# Patient Record
Sex: Male | Born: 1950
Health system: Southern US, Community
[De-identification: ages and names within clinical notes are randomized; demographics above are authoritative.]

## PROBLEM LIST (undated history)

## (undated) DIAGNOSIS — Z952 Presence of prosthetic heart valve: Secondary | ICD-10-CM

## (undated) DIAGNOSIS — I10 Essential (primary) hypertension: Secondary | ICD-10-CM

## (undated) DIAGNOSIS — M1990 Unspecified osteoarthritis, unspecified site: Secondary | ICD-10-CM

## (undated) DIAGNOSIS — T8859XA Other complications of anesthesia, initial encounter: Secondary | ICD-10-CM

## (undated) DIAGNOSIS — G602 Neuropathy in association with hereditary ataxia: Secondary | ICD-10-CM

## (undated) DIAGNOSIS — K219 Gastro-esophageal reflux disease without esophagitis: Secondary | ICD-10-CM

## (undated) DIAGNOSIS — Z950 Presence of cardiac pacemaker: Secondary | ICD-10-CM

## (undated) DIAGNOSIS — R7303 Prediabetes: Secondary | ICD-10-CM

## (undated) HISTORY — PX: INSERT / REPLACE / REMOVE PACEMAKER: SUR710

## (undated) HISTORY — PX: HEMORRHOID SURGERY: SHX153

## (undated) HISTORY — PX: OTHER SURGICAL HISTORY: SHX169

## (undated) HISTORY — PX: EYE SURGERY: SHX253

## (undated) HISTORY — PX: AORTIC VALVE REPLACEMENT: SHX41

---

## 2012-07-02 ENCOUNTER — Other Ambulatory Visit: Payer: Self-pay | Admitting: Orthopedic Surgery

## 2012-07-02 MED ORDER — DEXAMETHASONE SODIUM PHOSPHATE 10 MG/ML IJ SOLN: 10.0000 mg | Freq: Once | INTRAMUSCULAR | Status: DC

## 2012-07-02 MED ORDER — BUPIVACAINE LIPOSOME 1.3 % IJ SUSP: 20.0000 mL | Freq: Once | INTRAMUSCULAR | Status: DC

## 2012-07-06 NOTE — Progress Notes (Signed)
H&P performed 07/06/12 Dictation # [id number]

## 2012-07-07 NOTE — H&P (Signed)
Nathaniel Richard, Nathaniel Richard NO.:  [id number]  MEDICAL RECORD NO.:  [id number]  LOCATION:                               FACILITY:  [facility]  PHYSICIAN:  Ollen Gross, M.D.    DATE OF BIRTH:  [birth year]  DATE OF ADMISSION:  07/29/2012 DATE OF DISCHARGE:                             HISTORY & PHYSICAL   Date of surgery July 29, 2012.  ADMITTING DIAGNOSIS:  End-stage osteoarthritis, right hip.  PROPOSED PROCEDURE:  Total hip arthroplasty, right hip.  HISTORY OF PRESENT ILLNESS:  This is a 61 year old gentleman with a long history of osteoarthritis of his right hip that has failed conservative management.  After discussion of treatments benefits risks and options, the patient is now scheduled for total hip arthroplasty of the right hip.  The patient plans on going home after surgery.  His medical doctor is Dr. Dalbert Garnet.  His specialty physician is Dr. Harle Stanford, his cardiologist.  The surgery, risks, benefits, and aftercare were discussed in detail with the patient.  Questions invited and answered.  PAST MEDICAL HISTORY/DRUG:  Allergies none.  CURRENT MEDICATION:  Lisinopril 10 mg and Nasonex nasal spray p.r.n.  SERIOUS MEDICAL ILLNESSES:  Hypertension and history of heart block with pacemaker.  PREVIOUS SURGERIES:  Pacemaker insertion and hemorrhoidectomy.  FAMILY HISTORY:  Positive for congestive heart failure, CVA, diabetes, and hypertension.  SOCIAL HISTORY:  The patient is married.  He is a Psychologist, sport and exercise.  He does not smoke and does not drink.  He lives with his wife, who is a Engineer, civil (consulting) and again plans on going home after surgery.  REVIEW OF SYSTEMS:  CENTRAL NERVOUS SYSTEM:  Negative for headache, blurred vision, or dizziness.  PULMONARY:  Negative for shortness breath, PND, and orthopnea.  CARDIOVASCULAR:  Positive for history of heart block with pacemaker.  Negative for chest pain or palpitation. Positive for hypertension. GI:  Negative for  ulcers or hepatitis.  GU: Negative for urinary tract difficulty.  MUSCULOSKELETAL:  Positive as in HPI.  PHYSICAL EXAMINATION:  GENERAL:  This is a well-developed, well- nourished gentleman, in no acute distress. VITAL SIGNS:  Pulse is 64 and regular, and respirations are 12. HEENT:  Head normocephalic.  Nose patent.  Ears patent.  Pupils equal, round, reactive to light.  Throat without injection. NECK:  Supple without adenopathy.  Carotids 2+ without bruit. CHEST:  Clear to auscultation.  No rales or rhonchi.  Respirations 12. HEART:  Regular rate and rhythm at 72 beats per minute with 2/6 systolic ejection murmur that he has had for a long period of time. ABDOMEN:  Soft with active bowel sounds.  No masses or organomegaly. NEUROLOGIC:  The patient alert and oriented to time, place, and person. Cranial nerves II through XII grossly intact. EXTREMITIES:  The right hip with markedly decreased internal-external rotation.  He has full extension further flexion to 110 degrees, external rotation of 35 degrees and internal rotation to neutral. Dorsalis pedis, posterior tibialis pulses are 2+.  ASSESSMENT:  End-stage osteoarthritis of the right hip.  PLAN:  Total hip arthroplasty of the right hip.     Jaquelyn Bitter. Sharday Michl, P.A.   ______________________________ Homero Fellers  Aluisio, M.D.    SJC/MEDQ  D:  07/06/2012  T:  07/07/2012  Job:  [id number]

## 2012-07-17 ENCOUNTER — Encounter (HOSPITAL_COMMUNITY): Payer: Self-pay

## 2012-07-21 ENCOUNTER — Other Ambulatory Visit: Payer: Self-pay | Admitting: Surgical

## 2012-07-21 MED ORDER — DEXAMETHASONE SODIUM PHOSPHATE 10 MG/ML IJ SOLN: 10.0000 mg | Freq: Once | INTRAMUSCULAR | Status: DC

## 2012-07-21 MED ORDER — BUPIVACAINE 0.25 % ON-Q PUMP SINGLE CATH 300ML: 300.0000 mL | INJECTION | Status: DC

## 2012-07-22 NOTE — Patient Instructions (Signed)
20 [name]  07/22/2012   Your procedure is scheduled on:  07/29/12 0715am-0900am  Report to [facility] at 0515 AM.  Call this number if you have problems the morning of surgery: [phone]   Remember:   Do not eat food:After Midnight.  May have clear liquids:until Midnight .  Marland Kitchen  Take these medicines the morning of surgery with A SIP OF WATER:    Do not wear jewelry, make-up or nail polish.  Do not wear lotions, powders, or perfumes.   . Men may shave face and neck.  Do not bring valuables to the hospital.  Contacts, dentures or bridgework may not be worn into surgery.  Leave suitcase in the car. After surgery it may be brought to your room.  For patients admitted to the hospital, checkout time is 11:00 AM the day of discharge.       Special Instructions: CHG Shower Use Special Wash: 1/2 bottle night before surgery and 1/2 bottle morning of surgery. shower chin to toes with CHG. Wash face and private parts with regular soap.    Please read over the following fact sheets that you were given: MRSA Information, Blood Transfusion Fact sheet, Incentive Spirometry fact Sheet, coughing and deep breathing exercises, leg exercises

## 2012-07-23 ENCOUNTER — Encounter (HOSPITAL_COMMUNITY): Payer: Self-pay

## 2012-07-23 ENCOUNTER — Encounter (HOSPITAL_COMMUNITY)
Admission: RE | Admit: 2012-07-23 | Discharge: 2012-07-23 | Disposition: A | Discharge status: Home / Self Care | Payer: No Typology Code available for payment source | Source: Ambulatory Visit | Attending: Orthopedic Surgery | Admitting: Orthopedic Surgery

## 2012-07-23 ENCOUNTER — Ambulatory Visit (HOSPITAL_COMMUNITY)
Admission: RE | Admit: 2012-07-23 | Discharge: 2012-07-23 | Disposition: A | Discharge status: Home / Self Care | Payer: No Typology Code available for payment source | Source: Ambulatory Visit | Attending: Surgical | Admitting: Surgical

## 2012-07-23 DIAGNOSIS — Z950 Presence of cardiac pacemaker: Secondary | ICD-10-CM | POA: Insufficient documentation

## 2012-07-23 DIAGNOSIS — Z01812 Encounter for preprocedural laboratory examination: Secondary | ICD-10-CM | POA: Insufficient documentation

## 2012-07-23 DIAGNOSIS — Z01818 Encounter for other preprocedural examination: Secondary | ICD-10-CM | POA: Insufficient documentation

## 2012-07-23 HISTORY — DX: Presence of cardiac pacemaker: Z95.0

## 2012-07-23 HISTORY — DX: Essential (primary) hypertension: I10

## 2012-07-23 HISTORY — DX: Unspecified osteoarthritis, unspecified site: M19.90

## 2012-07-23 LAB — URINALYSIS, ROUTINE W REFLEX MICROSCOPIC
Bilirubin Urine: NEGATIVE
Glucose, UA: NEGATIVE mg/dL
Hgb urine dipstick: NEGATIVE
Ketones, ur: NEGATIVE mg/dL
Leukocytes, UA: NEGATIVE
Nitrite: NEGATIVE
Protein, ur: NEGATIVE mg/dL
Specific Gravity, Urine: 1.01 (ref 1.005–1.030)
Urobilinogen, UA: 0.2 mg/dL (ref 0.0–1.0)
pH: 7 (ref 5.0–8.0)

## 2012-07-23 LAB — COMPREHENSIVE METABOLIC PANEL
ALT: 28 U/L (ref 0–53)
AST: 32 U/L (ref 0–37)
Albumin: 3.8 g/dL (ref 3.5–5.2)
Alkaline Phosphatase: 57 U/L (ref 39–117)
BUN: 17 mg/dL (ref 6–23)
CO2: 27 mEq/L (ref 19–32)
Calcium: 9.5 mg/dL (ref 8.4–10.5)
Chloride: 102 mEq/L (ref 96–112)
Creatinine, Ser: 0.96 mg/dL (ref 0.50–1.35)
GFR calc Af Amer: >90 mL/min (ref >90)
GFR calc non Af Amer: 87 mL/min — ABNORMAL LOW (ref >90)
Glucose, Bld: 95 mg/dL (ref 70–99)
Potassium: 4.1 mEq/L (ref 3.5–5.1)
Sodium: 137 mEq/L (ref 135–145)
Total Bilirubin: 0.6 mg/dL (ref 0.3–1.2)
Total Protein: 6.9 g/dL (ref 6.0–8.3)

## 2012-07-23 LAB — DIFFERENTIAL
Basophils Relative: 1 % (ref 0–1)
Eosinophils Absolute: 0.4 10*3/uL (ref 0.0–0.7)
Monocytes Relative: 12 % (ref 3–12)
Neutrophils Relative %: 50 % (ref 43–77)

## 2012-07-23 LAB — PROTIME-INR
INR: 0.94 (ref 0.00–1.49)
Prothrombin Time: 12.8 seconds (ref 11.6–15.2)

## 2012-07-23 LAB — CBC
Hemoglobin: 15.4 g/dL (ref 13.0–17.0)
MCH: 30.9 pg (ref 26.0–34.0)
MCHC: 34.8 g/dL (ref 30.0–36.0)

## 2012-07-23 LAB — SURGICAL PCR SCREEN: Staphylococcus aureus: POSITIVE — AB

## 2012-07-23 LAB — APTT: aPTT: 32 seconds (ref 24–37)

## 2012-07-23 NOTE — Progress Notes (Signed)
Last ICD interrogation 03/10/12 on chart and 11/12 on chart  04/22/11 EKG on chart  ECHO 4/11 on chart

## 2012-07-23 NOTE — Progress Notes (Signed)
07/23/12 0856  OBSTRUCTIVE SLEEP APNEA  Have you ever been diagnosed with sleep apnea through a sleep study? No  Do you snore loudly (loud enough to be heard through closed doors)?  1  Do you often feel tired, fatigued, or sleepy during the daytime? 0  Has anyone observed you stop breathing during your sleep? 0  Do you have, or are you being treated for high blood pressure? 1  BMI more than 35 kg/m2? 0  Age over 62 years old? 1  Neck circumference greater than 40 cm/18 inches? 0  Gender: 1  Obstructive Sleep Apnea Score 4   Score 4 or greater  Updated health history

## 2012-07-27 ENCOUNTER — Other Ambulatory Visit: Payer: Self-pay | Admitting: Orthopedic Surgery

## 2012-07-28 NOTE — Anesthesia Preprocedure Evaluation (Addendum)
Anesthesia Evaluation  Patient identified by MRN, date of birth, ID band Patient awake    Reviewed: Allergy & Precautions, H&P , NPO status , Patient's Chart, lab work & pertinent test results  Airway Mallampati: II TM Distance: >3 FB Neck ROM: Full    Dental  (+) Dental Advisory Given Upper bridge.:   Pulmonary neg pulmonary ROS,  breath sounds clear to auscultation  Pulmonary exam normal       Cardiovascular hypertension, Pt. on medications + dysrhythmias + pacemaker + Valvular Problems/Murmurs Rhythm:Regular Rate:Normal + Systolic murmurs    Neuro/Psych negative neurological ROS  negative psych ROS   GI/Hepatic negative GI ROS, Neg liver ROS,   Endo/Other  negative endocrine ROS  Renal/GU negative Renal ROS  negative genitourinary   Musculoskeletal negative musculoskeletal ROS (+)   Abdominal   Peds negative pediatric ROS (+)  Hematology negative hematology ROS (+)   Anesthesia Other Findings   Reproductive/Obstetrics negative OB ROS                          Anesthesia Physical Anesthesia Plan  ASA: III  Anesthesia Plan: General   Post-op Pain Management:    Induction: Intravenous  Airway Management Planned: Oral ETT  Additional Equipment:   Intra-op Plan:   Post-operative Plan: Extubation in OR  Informed Consent: I have reviewed the patients History and Physical, chart, labs and discussed the procedure including the risks, benefits and alternatives for the proposed anesthesia with the patient or authorized representative who has indicated his/her understanding and acceptance.   Dental advisory given  Plan Discussed with: CRNA  Anesthesia Plan Comments: (Pacemaker for heart block. Pacemaker prescription reviewed. Discussed r/b/a/ general versus spinal. Patient prefers general.)       Anesthesia Quick Evaluation

## 2012-07-29 ENCOUNTER — Encounter (HOSPITAL_COMMUNITY): Payer: Self-pay | Admitting: Anesthesiology

## 2012-07-29 ENCOUNTER — Encounter (HOSPITAL_COMMUNITY)
Admission: RE | Disposition: A | Discharge status: Home Health | Payer: Self-pay | Source: Ambulatory Visit | Attending: Orthopedic Surgery

## 2012-07-29 ENCOUNTER — Ambulatory Visit (HOSPITAL_COMMUNITY): Payer: No Typology Code available for payment source

## 2012-07-29 ENCOUNTER — Inpatient Hospital Stay: Admit: 2012-07-29 | Payer: Self-pay | Admitting: Orthopedic Surgery

## 2012-07-29 ENCOUNTER — Ambulatory Visit (HOSPITAL_COMMUNITY): Payer: No Typology Code available for payment source | Admitting: Anesthesiology

## 2012-07-29 ENCOUNTER — Encounter (HOSPITAL_COMMUNITY): Payer: Self-pay | Admitting: *Deleted

## 2012-07-29 ENCOUNTER — Inpatient Hospital Stay (HOSPITAL_COMMUNITY)
Admission: RE | Admit: 2012-07-29 | Discharge: 2012-07-31 | DRG: 470 | Disposition: A | Discharge status: Home Health | Payer: No Typology Code available for payment source | Source: Ambulatory Visit | Attending: Orthopedic Surgery | Admitting: Orthopedic Surgery

## 2012-07-29 DIAGNOSIS — Z950 Presence of cardiac pacemaker: Secondary | ICD-10-CM

## 2012-07-29 DIAGNOSIS — I499 Cardiac arrhythmia, unspecified: Secondary | ICD-10-CM | POA: Diagnosis present

## 2012-07-29 DIAGNOSIS — M1610 Unilateral primary osteoarthritis, unspecified hip: Principal | ICD-10-CM | POA: Diagnosis present

## 2012-07-29 DIAGNOSIS — Z96649 Presence of unspecified artificial hip joint: Secondary | ICD-10-CM

## 2012-07-29 DIAGNOSIS — Z79899 Other long term (current) drug therapy: Secondary | ICD-10-CM

## 2012-07-29 DIAGNOSIS — M169 Osteoarthritis of hip, unspecified: Secondary | ICD-10-CM | POA: Diagnosis present

## 2012-07-29 DIAGNOSIS — I10 Essential (primary) hypertension: Secondary | ICD-10-CM | POA: Diagnosis present

## 2012-07-29 HISTORY — PX: TOTAL HIP ARTHROPLASTY: SHX124

## 2012-07-29 LAB — TYPE AND SCREEN
ABO/RH(D): A POS
Antibody Screen: NEGATIVE

## 2012-07-29 LAB — ABO/RH: ABO/RH(D): A POS

## 2012-07-29 SURGERY — ARTHROPLASTY, HIP, TOTAL,POSTERIOR APPROACH
Anesthesia: Choice | Site: Hip | Laterality: Right

## 2012-07-29 SURGERY — ARTHROPLASTY, HIP, TOTAL,POSTERIOR APPROACH
Anesthesia: General | Site: Hip | Laterality: Right | Wound class: Clean

## 2012-07-29 MED ORDER — METOCLOPRAMIDE HCL 5 MG/ML IJ SOLN: 5.0000 mg | Freq: Three times a day (TID) | INTRAMUSCULAR | Status: DC | PRN

## 2012-07-29 MED ORDER — MORPHINE SULFATE 2 MG/ML IJ SOLN: 1.0000 mg | INTRAMUSCULAR | Status: DC | PRN

## 2012-07-29 MED ORDER — PROPOFOL 10 MG/ML IV EMUL
INTRAVENOUS | Status: DC | PRN
  Administered 2012-07-29: 200 mg via INTRAVENOUS

## 2012-07-29 MED ORDER — LACTATED RINGERS IV SOLN
INTRAVENOUS | Status: DC | PRN
  Administered 2012-07-29 (×2): via INTRAVENOUS

## 2012-07-29 MED ORDER — METOCLOPRAMIDE HCL 10 MG PO TABS: 5.0000 mg | ORAL_TABLET | Freq: Three times a day (TID) | ORAL | Status: DC | PRN

## 2012-07-29 MED ORDER — CEFAZOLIN SODIUM-DEXTROSE 2-3 GM-% IV SOLR
INTRAVENOUS | Status: AC
  Filled 2012-07-29: qty 50

## 2012-07-29 MED ORDER — HYDROMORPHONE HCL PF 1 MG/ML IJ SOLN
INTRAMUSCULAR | Status: AC
  Filled 2012-07-29: qty 1

## 2012-07-29 MED ORDER — TRAMADOL HCL 50 MG PO TABS: 50.0000 mg | ORAL_TABLET | Freq: Four times a day (QID) | ORAL | Status: DC | PRN

## 2012-07-29 MED ORDER — ONDANSETRON HCL 4 MG PO TABS: 4.0000 mg | ORAL_TABLET | Freq: Four times a day (QID) | ORAL | Status: DC | PRN

## 2012-07-29 MED ORDER — MIDAZOLAM HCL 5 MG/5ML IJ SOLN
INTRAMUSCULAR | Status: DC | PRN
  Administered 2012-07-29: 2 mg via INTRAVENOUS

## 2012-07-29 MED ORDER — RIVAROXABAN 10 MG PO TABS
10.0000 mg | ORAL_TABLET | Freq: Every day | ORAL | Status: DC
  Administered 2012-07-30 – 2012-07-31 (×2): 10 mg via ORAL
  Filled 2012-07-29 (×3): qty 1

## 2012-07-29 MED ORDER — LIDOCAINE HCL (CARDIAC) 20 MG/ML IV SOLN
INTRAVENOUS | Status: DC | PRN
  Administered 2012-07-29: 80 mg via INTRAVENOUS

## 2012-07-29 MED ORDER — TEMAZEPAM 15 MG PO CAPS: 15.0000 mg | ORAL_CAPSULE | Freq: Every evening | ORAL | Status: DC | PRN

## 2012-07-29 MED ORDER — ACETAMINOPHEN 10 MG/ML IV SOLN
1000.0000 mg | Freq: Once | INTRAVENOUS | Status: AC
  Administered 2012-07-29: 1000 mg via INTRAVENOUS

## 2012-07-29 MED ORDER — SUCCINYLCHOLINE CHLORIDE 20 MG/ML IJ SOLN
INTRAMUSCULAR | Status: DC | PRN
  Administered 2012-07-29: 100 mg via INTRAVENOUS

## 2012-07-29 MED ORDER — DIPHENHYDRAMINE HCL 12.5 MG/5ML PO ELIX: 12.5000 mg | ORAL_SOLUTION | ORAL | Status: DC | PRN

## 2012-07-29 MED ORDER — ONDANSETRON HCL 4 MG/2ML IJ SOLN
INTRAMUSCULAR | Status: DC | PRN
  Administered 2012-07-29: 4 mg via INTRAVENOUS

## 2012-07-29 MED ORDER — METHOCARBAMOL 100 MG/ML IJ SOLN
500.0000 mg | Freq: Four times a day (QID) | INTRAVENOUS | Status: DC | PRN
  Administered 2012-07-29: 500 mg via INTRAVENOUS
  Filled 2012-07-29: qty 5

## 2012-07-29 MED ORDER — ACETAMINOPHEN 10 MG/ML IV SOLN
1000.0000 mg | Freq: Four times a day (QID) | INTRAVENOUS | Status: AC
  Administered 2012-07-29 – 2012-07-30 (×4): 1000 mg via INTRAVENOUS
  Filled 2012-07-29 (×7): qty 100

## 2012-07-29 MED ORDER — NEOSTIGMINE METHYLSULFATE 1 MG/ML IJ SOLN
INTRAMUSCULAR | Status: DC | PRN
  Administered 2012-07-29: 5 mg via INTRAVENOUS

## 2012-07-29 MED ORDER — DEXAMETHASONE SODIUM PHOSPHATE 10 MG/ML IJ SOLN
INTRAMUSCULAR | Status: DC | PRN
  Administered 2012-07-29: 10 mg via INTRAVENOUS

## 2012-07-29 MED ORDER — HYDROMORPHONE HCL PF 1 MG/ML IJ SOLN
0.2500 mg | INTRAMUSCULAR | Status: DC | PRN
  Administered 2012-07-29 (×4): 0.5 mg via INTRAVENOUS

## 2012-07-29 MED ORDER — ROCURONIUM BROMIDE 100 MG/10ML IV SOLN
INTRAVENOUS | Status: DC | PRN
  Administered 2012-07-29: 35 mg via INTRAVENOUS

## 2012-07-29 MED ORDER — FENTANYL CITRATE 0.05 MG/ML IJ SOLN
INTRAMUSCULAR | Status: DC | PRN
  Administered 2012-07-29 (×5): 50 ug via INTRAVENOUS

## 2012-07-29 MED ORDER — CEFAZOLIN SODIUM-DEXTROSE 2-3 GM-% IV SOLR
2.0000 g | INTRAVENOUS | Status: AC
  Administered 2012-07-29: 2 g via INTRAVENOUS

## 2012-07-29 MED ORDER — MENTHOL 3 MG MT LOZG
1.0000 | LOZENGE | OROMUCOSAL | Status: DC | PRN
  Administered 2012-07-29: 3 mg via ORAL
  Filled 2012-07-29 (×2): qty 9

## 2012-07-29 MED ORDER — DOCUSATE SODIUM 100 MG PO CAPS
100.0000 mg | ORAL_CAPSULE | Freq: Two times a day (BID) | ORAL | Status: DC
  Administered 2012-07-29 – 2012-07-31 (×4): 100 mg via ORAL

## 2012-07-29 MED ORDER — METHOCARBAMOL 500 MG PO TABS
500.0000 mg | ORAL_TABLET | Freq: Four times a day (QID) | ORAL | Status: DC | PRN
  Administered 2012-07-29 – 2012-07-31 (×5): 500 mg via ORAL
  Filled 2012-07-29 (×5): qty 1

## 2012-07-29 MED ORDER — SODIUM CHLORIDE 0.9 % IV SOLN: INTRAVENOUS | Status: DC

## 2012-07-29 MED ORDER — BUPIVACAINE LIPOSOME 1.3 % IJ SUSP
INTRAMUSCULAR | Status: DC | PRN
  Administered 2012-07-29: 20 mL

## 2012-07-29 MED ORDER — GLYCOPYRROLATE 0.2 MG/ML IJ SOLN
INTRAMUSCULAR | Status: DC | PRN
  Administered 2012-07-29: .8 mg via INTRAVENOUS

## 2012-07-29 MED ORDER — CHLORHEXIDINE GLUCONATE 4 % EX LIQD
60.0000 mL | Freq: Once | CUTANEOUS | Status: DC
  Filled 2012-07-29: qty 60

## 2012-07-29 MED ORDER — BUPIVACAINE LIPOSOME 1.3 % IJ SUSP
20.0000 mL | Freq: Once | INTRAMUSCULAR | Status: DC
  Filled 2012-07-29: qty 20

## 2012-07-29 MED ORDER — FLEET ENEMA 7-19 GM/118ML RE ENEM: 1.0000 | ENEMA | Freq: Once | RECTAL | Status: AC | PRN

## 2012-07-29 MED ORDER — BISACODYL 10 MG RE SUPP: 10.0000 mg | Freq: Every day | RECTAL | Status: DC | PRN

## 2012-07-29 MED ORDER — FLUTICASONE PROPIONATE 50 MCG/ACT NA SUSP
2.0000 | Freq: Every day | NASAL | Status: DC | PRN
  Filled 2012-07-29: qty 16

## 2012-07-29 MED ORDER — CEFAZOLIN SODIUM 1-5 GM-% IV SOLN
1.0000 g | Freq: Four times a day (QID) | INTRAVENOUS | Status: AC
  Administered 2012-07-29 (×2): 1 g via INTRAVENOUS
  Filled 2012-07-29 (×2): qty 50

## 2012-07-29 MED ORDER — PHENOL 1.4 % MT LIQD
1.0000 | OROMUCOSAL | Status: DC | PRN
  Filled 2012-07-29: qty 177

## 2012-07-29 MED ORDER — 0.9 % SODIUM CHLORIDE (POUR BTL) OPTIME
TOPICAL | Status: DC | PRN
  Administered 2012-07-29: 1000 mL

## 2012-07-29 MED ORDER — DEXTROSE-NACL 5-0.9 % IV SOLN
INTRAVENOUS | Status: DC
  Administered 2012-07-29 – 2012-07-30 (×2): via INTRAVENOUS

## 2012-07-29 MED ORDER — OXYCODONE HCL 5 MG PO TABS
5.0000 mg | ORAL_TABLET | ORAL | Status: DC | PRN
  Administered 2012-07-29: 5 mg via ORAL
  Administered 2012-07-30 – 2012-07-31 (×5): 10 mg via ORAL
  Filled 2012-07-29 (×3): qty 2
  Filled 2012-07-29: qty 1
  Filled 2012-07-29: qty 2

## 2012-07-29 MED ORDER — ACETAMINOPHEN 325 MG PO TABS: 650.0000 mg | ORAL_TABLET | Freq: Four times a day (QID) | ORAL | Status: DC | PRN

## 2012-07-29 MED ORDER — ACETAMINOPHEN 10 MG/ML IV SOLN
INTRAVENOUS | Status: AC
  Filled 2012-07-29: qty 100

## 2012-07-29 MED ORDER — LACTATED RINGERS IV SOLN: INTRAVENOUS | Status: DC

## 2012-07-29 MED ORDER — HYDROMORPHONE HCL PF 1 MG/ML IJ SOLN
INTRAMUSCULAR | Status: DC | PRN
  Administered 2012-07-29: 0.5 mg via INTRAVENOUS

## 2012-07-29 MED ORDER — RIVAROXABAN 10 MG PO TABS
10.0000 mg | ORAL_TABLET | ORAL | Status: DC
  Filled 2012-07-29: qty 1

## 2012-07-29 MED ORDER — PROMETHAZINE HCL 25 MG/ML IJ SOLN: 6.2500 mg | INTRAMUSCULAR | Status: DC | PRN

## 2012-07-29 MED ORDER — ACETAMINOPHEN 650 MG RE SUPP: 650.0000 mg | Freq: Four times a day (QID) | RECTAL | Status: DC | PRN

## 2012-07-29 MED ORDER — LORATADINE 10 MG PO TABS
10.0000 mg | ORAL_TABLET | Freq: Every day | ORAL | Status: DC
  Administered 2012-07-29 – 2012-07-31 (×3): 10 mg via ORAL
  Filled 2012-07-29 (×3): qty 1

## 2012-07-29 MED ORDER — ONDANSETRON HCL 4 MG/2ML IJ SOLN: 4.0000 mg | Freq: Four times a day (QID) | INTRAMUSCULAR | Status: DC | PRN

## 2012-07-29 MED ORDER — POLYETHYLENE GLYCOL 3350 17 G PO PACK: 17.0000 g | PACK | Freq: Every day | ORAL | Status: DC | PRN

## 2012-07-29 SURGICAL SUPPLY — 50 items
BAG ZIPLOCK 12X15 (MISCELLANEOUS) ×2 IMPLANT
BIT DRILL 2.8X128 (BIT) ×2 IMPLANT
BLADE EXTENDED COATED 6.5IN (ELECTRODE) ×2 IMPLANT
BLADE SAW SAG 73X25 THK (BLADE) ×1
BLADE SAW SGTL 73X25 THK (BLADE) ×1 IMPLANT
CLOTH BEACON ORANGE TIMEOUT ST (SAFETY) ×2 IMPLANT
CLSR STERI-STRIP ANTIMIC 1/2X4 (GAUZE/BANDAGES/DRESSINGS) ×2 IMPLANT
DECANTER SPIKE VIAL GLASS SM (MISCELLANEOUS) ×2 IMPLANT
DRAPE INCISE IOBAN 66X45 STRL (DRAPES) ×2 IMPLANT
DRAPE ORTHO SPLIT 77X108 STRL (DRAPES) ×2
DRAPE POUCH INSTRU U-SHP 10X18 (DRAPES) ×2 IMPLANT
DRAPE SURG ORHT 6 SPLT 77X108 (DRAPES) ×2 IMPLANT
DRAPE U-SHAPE 47X51 STRL (DRAPES) ×2 IMPLANT
DRSG ADAPTIC 3X8 NADH LF (GAUZE/BANDAGES/DRESSINGS) ×2 IMPLANT
DRSG MEPILEX BORDER 4X4 (GAUZE/BANDAGES/DRESSINGS) ×2 IMPLANT
DRSG MEPILEX BORDER 4X8 (GAUZE/BANDAGES/DRESSINGS) ×2 IMPLANT
DURAPREP 26ML APPLICATOR (WOUND CARE) ×2 IMPLANT
ELECT REM PT RETURN 9FT ADLT (ELECTROSURGICAL) ×2
ELECTRODE REM PT RTRN 9FT ADLT (ELECTROSURGICAL) ×1 IMPLANT
EVACUATOR 1/8 PVC DRAIN (DRAIN) ×2 IMPLANT
FACESHIELD LNG OPTICON STERILE (SAFETY) ×8 IMPLANT
GLOVE BIO SURGEON STRL SZ8 (GLOVE) ×2 IMPLANT
GLOVE BIOGEL PI IND STRL 8 (GLOVE) ×2 IMPLANT
GLOVE BIOGEL PI INDICATOR 8 (GLOVE) ×2
GLOVE ECLIPSE 8.0 STRL XLNG CF (GLOVE) ×2 IMPLANT
GLOVE SURG SS PI 6.5 STRL IVOR (GLOVE) IMPLANT
GOWN STRL NON-REIN LRG LVL3 (GOWN DISPOSABLE) ×2 IMPLANT
GOWN STRL REIN XL XLG (GOWN DISPOSABLE) ×4 IMPLANT
IMMOBILIZER KNEE 20 (SOFTGOODS) ×4
IMMOBILIZER KNEE 20 THIGH 36 (SOFTGOODS) ×2 IMPLANT
KIT BASIN OR (CUSTOM PROCEDURE TRAY) ×2 IMPLANT
MANIFOLD NEPTUNE II (INSTRUMENTS) ×2 IMPLANT
NDL SAFETY ECLIPSE 18X1.5 (NEEDLE) ×1 IMPLANT
NEEDLE HYPO 18GX1.5 SHARP (NEEDLE) ×1
NS IRRIG 1000ML POUR BTL (IV SOLUTION) ×2 IMPLANT
PACK TOTAL JOINT (CUSTOM PROCEDURE TRAY) ×2 IMPLANT
PASSER SUT SWANSON 36MM LOOP (INSTRUMENTS) ×2 IMPLANT
POSITIONER SURGICAL ARM (MISCELLANEOUS) ×2 IMPLANT
SPONGE GAUZE 4X4 12PLY (GAUZE/BANDAGES/DRESSINGS) ×2 IMPLANT
STRIP CLOSURE SKIN 1/2X4 (GAUZE/BANDAGES/DRESSINGS) ×4 IMPLANT
SUT ETHIBOND NAB CT1 #1 30IN (SUTURE) ×4 IMPLANT
SUT MNCRL AB 4-0 PS2 18 (SUTURE) ×2 IMPLANT
SUT VIC AB 2-0 CT1 27 (SUTURE) ×3
SUT VIC AB 2-0 CT1 TAPERPNT 27 (SUTURE) ×3 IMPLANT
SUT VLOC 180 0 24IN GS25 (SUTURE) ×4 IMPLANT
SYR 50ML LL SCALE MARK (SYRINGE) ×2 IMPLANT
TOWEL OR 17X26 10 PK STRL BLUE (TOWEL DISPOSABLE) ×4 IMPLANT
TOWEL OR NON WOVEN STRL DISP B (DISPOSABLE) ×2 IMPLANT
TRAY FOLEY CATH 14FRSI W/METER (CATHETERS) ×2 IMPLANT
WATER STERILE IRR 1500ML POUR (IV SOLUTION) ×2 IMPLANT

## 2012-07-29 NOTE — H&P (Signed)
  CC- Nathaniel Richard is a 61 y.o. male who presents with right hip pain  Hip Pain: Patient complains of right hip pain. Onset of the symptoms was several years ago. Pain is in the groin radaiting to anterior thigh. The pain occurs with all activities and now occurs at rest at times. It has been progressive in nature and associated with significant dysfunction, which has also been progressive.He presents now for right total hip arthroplasty.  Past Medical History  Diagnosis Date  . Hypertension   . Dysrhythmia   . Pacemaker   . Heart murmur   . Arthritis     Past Surgical History  Procedure Date  . Insert / replace / remove pacemaker   . Hemorrhoid surgery     Prior to Admission medications   Medication Sig Start Date End Date Taking? Authorizing Provider  ibuprofen (ADVIL,MOTRIN) 200 MG tablet Take 200 mg by mouth every 6 (six) hours as needed. Pain   Yes Historical Provider, MD  lisinopril (PRINIVIL,ZESTRIL) 10 MG tablet Take 10 mg by mouth every morning.   Yes Historical Provider, MD  mometasone (NASONEX) 50 MCG/ACT nasal spray Place 2 sprays into the nose daily. As needed   Yes Historical Provider, MD  cetirizine (ZYRTEC) 10 MG tablet Take 10 mg by mouth daily. Daily as needed    Historical Provider, MD    Physical Examination: General appearance - alert, well appearing, and in no distress Mental status - alert, oriented to person, place, and time Neck - supple, no significant adenopathy Chest - clear to auscultation, no wheezes, rales or rhonchi, symmetric air entry Heart - normal rate, regular rhythm, normal S1, S2, no murmurs, rubs, clicks or gallops Abdomen - soft, nontender, nondistended, no masses or organomegaly Neurological - alert, oriented, normal speech, no focal findings or movement disorder noted Musculoskeletal - no joint tenderness, deformity or swelling, except for right hip with painful limited range of motion  A right hip exam was performed. TENDERNESS:  none ROM: 90 degrees flexion, 0 degrees internal rotation, 0 degrees external rotation, 20 degrees abduction and pain with internal rotation STRENGTH: limited by pain GAIT: antalgic  ASSESSMENT:Right hip OA  Plan- Right Total hip arthroplasty. Discussed procedure, risks, potential complications and rehab course with the patient, who elects to proceed. Goals are decreased pain and improved function, both of which should be achieved.  Nathaniel Rankin Mylen Mangan, MD    07/29/2012, 7:10 AM

## 2012-07-29 NOTE — Progress Notes (Signed)
Utilization review completed.  

## 2012-07-29 NOTE — Transfer of Care (Signed)
Immediate Anesthesia Transfer of Care Note  Patient: Nathaniel Richard  Procedure(s) Performed: Procedure(s) (LRB): TOTAL HIP ARTHROPLASTY (Right)  Patient Location: PACU  Anesthesia Type: General  Level of Consciousness: awake, alert , oriented and patient cooperative  Airway & Oxygen Therapy: Patient Spontanous Breathing and Patient connected to face mask oxygen  Post-op Assessment: Report given to PACU RN, Post -op Vital signs reviewed and stable and Patient moving all extremities  Post vital signs: Reviewed and stable  Complications: No apparent anesthesia complications

## 2012-07-29 NOTE — Preoperative (Signed)
Beta Blockers   Reason not to administer Beta Blockers:Not Applicable 

## 2012-07-29 NOTE — Progress Notes (Signed)
Portable AP Pelvis and AP Right Hip X-rays done. 

## 2012-07-29 NOTE — Progress Notes (Signed)
X-ray results noted 

## 2012-07-29 NOTE — Op Note (Signed)
Pre-operative diagnosis- Osteoarthritis Right hip  Post-operative diagnosis- Osteoarthritis  Right hip  Procedure-  RightTotal Hip Arthroplasty  Surgeon- Nathaniel Richard. Nathaniel Rogue, MD  Assistant- Nathaniel Rancher, MD   Anesthesia  General  EBL- 250   Drain Hemovac   Complication- None  Condition-PACU - hemodynamically stable.   Brief Clinical Note- Nathaniel Richard is a 61 y.o. male with end stage arthritis of his right hip with progressively worsening pain and dysfunction. Pain occurs with activity and rest including pain at night. He has tried analgesics, protected weight bearing and rest without benefit. Pain is too severe to attempt physical therapy. Radiographs demonstrate bone on bone arthritis with subchondral cyst formation. He presents now for right THA.  Procedure in detail-   The patient is brought into the operating room and placed on the operating table. After successful administration of General  anesthesia, the patient is placed in the  Left lateral decubitus position with the  Right side up and held in place with the hip positioner. The lower extremity is isolated from the perineum with plastic drapes and time-out is performed by the surgical team. The lower extremity is then prepped and draped in the usual sterile fashion. A short posterolateral incision is made with a ten blade through the subcutaneous tissue to the level of the fascia lata which is incised in line with the skin incision. The sciatic nerve is palpated and protected and the short external rotators and capsule are isolated from the femur. The hip is then dislocated and the center of the femoral head is marked. A trial prosthesis is placed such that the trial head corresponds to the center of the patients' native femoral head. The resection level is marked on the femoral neck and the resection is made with an oscillating saw. The femoral head is removed and femoral retractors placed to gain access to the femoral canal.  The canal finder is passed into the femoral canal and the canal is thoroughly irrigated with sterile saline to remove the fatty contents. Axial reaming is performed to 15.5  mm, proximal reaming to 20D  and the sleeve machined to a small. A 20D small trial sleeve is placed into the proximal femur.      The femur is then retracted anteriorly to gain acetabular exposure. Acetabular retractors are placed and the labrum and osteophytes are removed, Acetabular reaming is performed to 55  mm and a 56  mm Pinnacle acetabular shell is placed in anatomic position with excellent purchase. Additional dome screws were not needed. An apex hole eliminator is placed and the permanent 36 mm neutral + 4 Marathon liner is placed into the acetabular shell.      The trial femur is then placed into the femoral canal. The size is 20 x 15  stem with a 36 + 8  neck and a 36 + 0 head with the neck version 10 degrees beyond  the patients' native anteversion. The hip is reduced with excellent stability with full extension and full external rotation, 70 degrees flexion with 40 degrees adduction and 90 degrees internal rotation and 90 degrees of flexion with 70 degrees of internal rotation. The operative leg is placed on top of the non-operative leg and the leg lengths are found to be equal. The trials are then removed and the permanent implant of the same size is impacted into the femoral canal. The ceramic femoral head of the same size as the trial is placed and the hip is reduced with the  same stability parameters. The operative leg is again placed on top of the non-operative leg and the leg lengths are found to be equal.      The wound is then copiously irrigated with saline solution and the capsule and short external rotators are re-attached to the femur through drill holes with Ethibond suture. The fascia lata is closed over a hemovac drain with #1 vicryl suture and the fascia lata, gluteal muscles and subcutaneous tissues are injected  with Exparel 20ml diluted with saline 50ml. The subcutaneous tissues are closed with #1 and2-0 vicryl and the subcuticular layer closed with running 4-0 Monocryl. The drain is hooked to suction, incision cleaned and dried, and steri-srips and a bulky sterile dressing applied. The limb is placed into a knee immobilizer and the patient is awakened and transported to recovery in stable condition.      Please note that a surgical assistant was a medical necessity for this procedure in order to perform it in a safe and expeditious manner. The assistant was necessary to provide retraction to the vital neurovascular structures and to retract and position the limb to allow for anatomic placement of the prosthetic components.  Nathaniel Richard Nathaniel Cater, MD    07/29/2012, 8:54 AM

## 2012-07-29 NOTE — Anesthesia Postprocedure Evaluation (Signed)
  Anesthesia Post-op Note  Patient: Nathaniel Richard  Procedure(s) Performed: Procedure(s) (LRB): TOTAL HIP ARTHROPLASTY (Right)  Patient Location: PACU  Anesthesia Type: General  Level of Consciousness: awake and alert   Airway and Oxygen Therapy: Patient Spontanous Breathing  Post-op Pain: mild  Post-op Assessment: Post-op Vital signs reviewed, Patient's Cardiovascular Status Stable, Respiratory Function Stable, Patent Airway and No signs of Nausea or vomiting  Post-op Vital Signs: stable  Complications: No apparent anesthesia complications. Paced rhythm now.

## 2012-07-29 NOTE — Interval H&P Note (Signed)
History and Physical Interval Note:  07/29/2012 7:17 AM  Toma Copier  has presented today for surgery, with the diagnosis of Osteoarthritis of the Right Hip  The various methods of treatment have been discussed with the patient and family. After consideration of risks, benefits and other options for treatment, the patient has consented to  Procedure(s) (LRB): TOTAL HIP ARTHROPLASTY (Right) as a surgical intervention .  The patient's history has been reviewed, patient examined, no change in status, stable for surgery.  I have reviewed the patient's chart and labs.  Questions were answered to the patient's satisfaction.     Loanne Drilling

## 2012-07-30 ENCOUNTER — Encounter (HOSPITAL_COMMUNITY): Payer: Self-pay | Admitting: Orthopedic Surgery

## 2012-07-30 LAB — BASIC METABOLIC PANEL
BUN: 14 mg/dL (ref 6–23)
CO2: 25 mEq/L (ref 19–32)
Chloride: 105 mEq/L (ref 96–112)
GFR calc non Af Amer: 87 mL/min — ABNORMAL LOW (ref >90)
Glucose, Bld: 168 mg/dL — ABNORMAL HIGH (ref 70–99)
Potassium: 4.4 mEq/L (ref 3.5–5.1)
Sodium: 137 mEq/L (ref 135–145)

## 2012-07-30 LAB — CBC
HCT: 36.9 % — ABNORMAL LOW (ref 39.0–52.0)
Hemoglobin: 13.1 g/dL (ref 13.0–17.0)
MCHC: 35.5 g/dL (ref 30.0–36.0)
RBC: 4.27 MIL/uL (ref 4.22–5.81)
WBC: 14.1 10*3/uL — ABNORMAL HIGH (ref 4.0–10.5)

## 2012-07-30 NOTE — Progress Notes (Signed)
Physical Therapy Treatment Patient Details Name: Nathaniel Richard MRN: [id number] DOB: February 21, 1951 Today's Date: 07/30/2012 Time: 8295-6213 PT Time Calculation (min): 27 min  PT Assessment / Plan / Recommendation Comments on Treatment Session       Follow Up Recommendations  Home health PT    Barriers to Discharge        Equipment Recommendations  None recommended by PT    Recommendations for Other Services OT consult  Frequency 7X/week   Plan Discharge plan remains appropriate    Precautions / Restrictions Precautions Precautions: Posterior Hip Precaution Comments: pt recalls all THP with min cueing Restrictions Weight Bearing Restrictions: No RLE Weight Bearing: Weight bearing as tolerated   Pertinent Vitals/Pain 4/10 with activity; min c/o pain at rest    Mobility  Bed Mobility Bed Mobility: Sit to Supine Sit to Supine: 4: Min assist Details for Bed Mobility Assistance: cues for sequence, THP and use of L LE to self assist Transfers Transfers: Sit to Stand;Stand to Sit Sit to Stand: 4: Min assist Stand to Sit: 4: Min assist Details for Transfer Assistance: cues for use of UEs to self assist and for LE management and adherence to THP Ambulation/Gait Ambulation/Gait Assistance: 4: Min assist Ambulation Distance (Feet): 150 Feet Assistive device: Rolling walker Ambulation/Gait Assistance Details: cues for posture, position from RW, sequence, stride length, and ER on R Gait Pattern: Step-to pattern    Exercises     PT Diagnosis:    PT Problem List:   PT Treatment Interventions:     PT Goals Acute Rehab PT Goals PT Goal Formulation: With patient Time For Goal Achievement: 08/04/12 Potential to Achieve Goals: Good Pt will go Supine/Side to Sit: with supervision PT Goal: Supine/Side to Sit - Progress: Goal set today Pt will go Sit to Supine/Side: with supervision PT Goal: Sit to Supine/Side - Progress: Goal set today Pt will go Sit to Stand: with supervision PT  Goal: Sit to Stand - Progress: Progressing toward goal Pt will go Stand to Sit: with supervision PT Goal: Stand to Sit - Progress: Progressing toward goal Pt will Ambulate: 51 - 150 feet;with supervision;with rolling walker PT Goal: Ambulate - Progress: Progressing toward goal Pt will Go Up / Down Stairs: 3-5 stairs;with min assist;with least restrictive assistive device PT Goal: Up/Down Stairs - Progress: Goal set today  Visit Information  Last PT Received On: 07/30/12 Assistance Needed: +1    Subjective Data  Subjective: It hurts just a little more than this morning Patient Stated Goal: Resume previous lifestyle with decreased pain   Cognition  Overall Cognitive Status: Appears within functional limits for tasks assessed/performed Arousal/Alertness: Awake/alert Orientation Level: Appears intact for tasks assessed Behavior During Session: [facility] for tasks performed    Balance     End of Session PT - End of Session Activity Tolerance: Patient tolerated treatment well Patient left: with call bell/phone within reach;with family/visitor present;in bed Nurse Communication: Mobility status   GP     Glenyce Randle 07/30/2012, 2:09 PM

## 2012-07-30 NOTE — Progress Notes (Signed)
   Subjective: 1 Day Post-Op Procedure(s) (LRB): TOTAL HIP ARTHROPLASTY (Right) Patient reports pain as mild.   Patient seen in rounds by Dr. Lequita Halt. Patient is well, and has had no acute complaints or problems We will start therapy today.  Plan is to go Home after hospital stay.  Possibly home tomorrow depending upon progress.  Objective: Vital signs in last 24 hours: Temp:  [98 F (36.7 C)-99.4 F (37.4 C)] 98.3 F (36.8 C) (08/29 0523) Pulse Rate:  [65-97] 71  (08/29 0523) Resp:  [12-18] 14  (08/29 0523) BP: (100-155)/(63-82) 117/78 mmHg (08/29 0523) SpO2:  [94 %-100 %] 98 % (08/29 0523) Weight:  [86.183 kg (190 lb)] 86.183 kg (190 lb) (08/28 1111)   Intake/Output this shift: Total I/O In: 360 [P.O.:360] Out: -   Labs:  Basename 07/30/12 0430  HGB 13.1    Basename 07/30/12 0430  WBC 14.1*  RBC 4.27  HCT 36.9*  PLT 215    Basename 07/30/12 0430  NA 137  K 4.4  CL 105  CO2 25  BUN 14  CREATININE 0.97  GLUCOSE 168*  CALCIUM 8.7   No results found for this basename: LABPT:2,INR:2 in the last 72 hours  EXAM General - Patient is Alert, Appropriate and Oriented Extremity - Neurovascular intact Sensation intact distally Dorsiflexion/Plantar flexion intact Dressing - dressing C/D/I Motor Function - intact, moving foot and toes well on exam.  Hemovac pulled without difficulty.  Past Medical History  Diagnosis Date  . Hypertension   . Dysrhythmia   . Pacemaker   . Heart murmur   . Arthritis     Assessment/Plan: 1 Day Post-Op Procedure(s) (LRB): TOTAL HIP ARTHROPLASTY (Right) Principal Problem:  *OA (osteoarthritis) of hip   Advance diet Up with therapy Discharge home with home health  DVT Prophylaxis - Xarelto Weight Bearing As Tolerated right Leg D/C Knee Immobilizer Hemovac Pulled Begin Therapy Hip Preacutions No vaccines.  PERKINS, ALEXZANDREW 07/30/2012, 10:07 AM

## 2012-07-30 NOTE — Evaluation (Signed)
Physical Therapy Evaluation Patient Details Name: Nathaniel Richard MRN: [id number] DOB: 10/18/1951 Today's Date: 07/30/2012 Time: 2956-2130 PT Time Calculation (min): 32 min  PT Assessment / Plan / Recommendation Clinical Impression  Pt s/p L THR presents with decreased L LE strength/ROM and limitations in functional mobility    PT Assessment  Patient needs continued PT services    Follow Up Recommendations  Home health PT    Barriers to Discharge        Equipment Recommendations  None recommended by PT    Recommendations for Other Services OT consult   Frequency 7X/week    Precautions / Restrictions Precautions Precautions: Posterior Hip Precaution Comments: sign hung in room Restrictions Weight Bearing Restrictions: No RLE Weight Bearing: Weight bearing as tolerated   Pertinent Vitals/Pain 4/10, premedicated, ice pack provided      Mobility  Bed Mobility Bed Mobility: Supine to Sit Supine to Sit: 4: Min assist Details for Bed Mobility Assistance: cues for sequence, THP and use of L LE to self assist Transfers Transfers: Sit to Stand;Stand to Sit Sit to Stand: 4: Min assist Stand to Sit: 4: Min assist Details for Transfer Assistance: cues for use of UEs to self assist and for LE management and adherence to THP Ambulation/Gait Ambulation/Gait Assistance: 4: Min guard Ambulation Distance (Feet): 73 Feet Assistive device: Rolling walker Ambulation/Gait Assistance Details: cues for sequence, posture, stride length, and position from RW Gait Pattern: Step-to pattern    Exercises Total Joint Exercises Ankle Circles/Pumps: AROM;10 reps;Supine;Both Quad Sets: AROM;10 reps;Supine;Both Heel Slides: Supine;10 reps;Left;AAROM Hip ABduction/ADduction: AAROM;Left;10 reps;Supine   PT Diagnosis: Difficulty walking  PT Problem List: Decreased strength;Decreased range of motion;Decreased activity tolerance;Decreased mobility;Decreased knowledge of use of DME;Pain;Decreased  knowledge of precautions PT Treatment Interventions: DME instruction;Gait training;Stair training;Functional mobility training;Therapeutic activities;Therapeutic exercise;Patient/family education   PT Goals Acute Rehab PT Goals PT Goal Formulation: With patient Time For Goal Achievement: 08/04/12 Potential to Achieve Goals: Good Pt will go Supine/Side to Sit: with supervision PT Goal: Supine/Side to Sit - Progress: Goal set today Pt will go Sit to Supine/Side: with supervision PT Goal: Sit to Supine/Side - Progress: Goal set today Pt will go Sit to Stand: with supervision PT Goal: Sit to Stand - Progress: Goal set today Pt will go Stand to Sit: with supervision PT Goal: Stand to Sit - Progress: Goal set today Pt will Ambulate: 51 - 150 feet;with supervision;with rolling walker PT Goal: Ambulate - Progress: Goal set today Pt will Go Up / Down Stairs: 3-5 stairs;with min assist;with least restrictive assistive device PT Goal: Up/Down Stairs - Progress: Goal set today  Visit Information  Last PT Received On: 07/30/12 Assistance Needed: +1    Subjective Data  Subjective: I've been wanting to get up Patient Stated Goal: Resume previous lifestyle with decreased pain   Prior Functioning  Home Living Lives With: Spouse Available Help at Discharge: Family Type of Home: House Home Access: Stairs to enter Secretary/administrator of Steps: 3 Entrance Stairs-Rails: None Home Layout: Able to live on main level with bedroom/bathroom Home Adaptive Equipment: Walker - rolling Prior Function Level of Independence: Independent Able to Take Stairs?: Yes Driving: Yes Communication Communication: No difficulties    Cognition  Overall Cognitive Status: Appears within functional limits for tasks assessed/performed Arousal/Alertness: Awake/alert Orientation Level: Appears intact for tasks assessed Behavior During Session: [facility] for tasks performed    Extremity/Trunk Assessment Right Upper  Extremity Assessment RUE ROM/Strength/Tone: [facility] for tasks assessed Left Upper Extremity Assessment LUE ROM/Strength/Tone: [facility]  for tasks assessed Right Lower Extremity Assessment RLE ROM/Strength/Tone: Deficits RLE ROM/Strength/Tone Deficits: 2+/5 hip strength, aarom at hip to 90 flex and 20 abd Left Lower Extremity Assessment LLE ROM/Strength/Tone: WFL for tasks assessed   Balance    End of Session PT - End of Session Equipment Utilized During Treatment: Gait belt Activity Tolerance: Patient tolerated treatment well Patient left: in chair;with call bell/phone within reach;with family/visitor present Nurse Communication: Mobility status  GP     Edris Schneck 07/30/2012, 1:01 PM

## 2012-07-31 LAB — BASIC METABOLIC PANEL
BUN: 13 mg/dL (ref 6–23)
CO2: 26 mEq/L (ref 19–32)
Calcium: 8.4 mg/dL (ref 8.4–10.5)
GFR calc non Af Amer: 77 mL/min — ABNORMAL LOW (ref >90)
Glucose, Bld: 102 mg/dL — ABNORMAL HIGH (ref 70–99)
Sodium: 136 mEq/L (ref 135–145)

## 2012-07-31 LAB — CBC
HCT: 37 % — ABNORMAL LOW (ref 39.0–52.0)
Hemoglobin: 12.9 g/dL — ABNORMAL LOW (ref 13.0–17.0)
MCH: 30.6 pg (ref 26.0–34.0)
MCHC: 34.9 g/dL (ref 30.0–36.0)
MCV: 87.9 fL (ref 78.0–100.0)
RBC: 4.21 MIL/uL — ABNORMAL LOW (ref 4.22–5.81)

## 2012-07-31 MED ORDER — RIVAROXABAN 10 MG PO TABS: 10.0000 mg | ORAL_TABLET | Freq: Every day | ORAL | Status: DC

## 2012-07-31 MED ORDER — OXYCODONE HCL 5 MG PO TABS: 5.0000 mg | ORAL_TABLET | ORAL | Status: AC | PRN

## 2012-07-31 MED ORDER — METHOCARBAMOL 500 MG PO TABS: 500.0000 mg | ORAL_TABLET | Freq: Four times a day (QID) | ORAL | Status: AC | PRN

## 2012-07-31 NOTE — Progress Notes (Signed)
Physical Therapy Treatment Patient Details Name: Nathaniel Richard MRN: [id number] DOB: 22-Apr-1951 Today's Date: 07/31/2012 Time: 1320-1350 PT Time Calculation (min): 30 min  PT Assessment / Plan / Recommendation Comments on Treatment Session  Reviewed car transfers with pt and spouse    Follow Up Recommendations  Home health PT    Barriers to Discharge        Equipment Recommendations  None recommended by OT;None recommended by PT    Recommendations for Other Services OT consult  Frequency 7X/week   Plan Discharge plan remains appropriate    Precautions / Restrictions Precautions Precautions: Posterior Hip Precaution Comments: pt recalls all THP with min cueing Restrictions Weight Bearing Restrictions: No RLE Weight Bearing: Weight bearing as tolerated   Pertinent Vitals/Pain     Mobility  Bed Mobility Bed Mobility: Supine to Sit;Sit to Supine Supine to Sit: 4: Min assist Sit to Supine: 4: Min assist Details for Bed Mobility Assistance: min cues for sequence and min assist with L LE Transfers Transfers: Sit to Stand;Stand to Sit Sit to Stand: 5: Supervision Stand to Sit: 5: Supervision Details for Transfer Assistance: one vc for RLE placement Ambulation/Gait Ambulation/Gait Assistance: 5: Supervision Ambulation Distance (Feet): 50 Feet Assistive device: Rolling walker Ambulation/Gait Assistance Details: min cues for sequence and position from RW Gait Pattern: Step-to pattern Stairs: Yes (4 bkwd with RW, 4 fwd with crutches, 1 with RW fwd) Stairs Assistance: 4: Min assist Stairs Assistance Details (indicate cue type and reason): cues for sequence and foot/crutch/RW placement Stair Management Technique: No rails;Backwards;Forwards;With walker;With crutches Number of Stairs: 4  (4 +4+1)    Exercises Total Joint Exercises Ankle Circles/Pumps: AROM;20 reps;Supine;Both Gluteal Sets: AROM;15 reps;Supine;Both Heel Slides: 20 reps;Left;Supine;AAROM Hip  ABduction/ADduction: AAROM;20 reps;Supine;Left Long Arc Quad: AAROM;20 reps;Seated;Both   PT Diagnosis:    PT Problem List:   PT Treatment Interventions:     PT Goals Acute Rehab PT Goals PT Goal Formulation: With patient Time For Goal Achievement: 08/04/12 Potential to Achieve Goals: Good Pt will go Supine/Side to Sit: with supervision PT Goal: Supine/Side to Sit - Progress: Progressing toward goal Pt will go Sit to Supine/Side: with supervision PT Goal: Sit to Supine/Side - Progress: Progressing toward goal Pt will go Sit to Stand: with supervision PT Goal: Sit to Stand - Progress: Met Pt will go Stand to Sit: with supervision PT Goal: Stand to Sit - Progress: Met Pt will Ambulate: 51 - 150 feet;with supervision;with rolling walker PT Goal: Ambulate - Progress: Met Pt will Go Up / Down Stairs: 3-5 stairs;with min assist;with least restrictive assistive device PT Goal: Up/Down Stairs - Progress: Met  Visit Information  Last PT Received On: 07/31/12 Assistance Needed: +1    Subjective Data  Subjective: I'm going home today after you see me twice Patient Stated Goal: Resume previous lifestyle with decreased pain   Cognition  Overall Cognitive Status: Appears within functional limits for tasks assessed/performed Arousal/Alertness: Awake/alert Orientation Level: Appears intact for tasks assessed Behavior During Session: [facility] for tasks performed    Balance     End of Session PT - End of Session Activity Tolerance: Patient tolerated treatment well Patient left: with call bell/phone within reach;with family/visitor present;in bed Nurse Communication: Mobility status   GP     Tywone Bembenek 07/31/2012, 4:48 PM

## 2012-07-31 NOTE — Plan of Care (Signed)
Problem: Consults Goal: Diagnosis- Total Joint Replacement Outcome: Completed/Met Date Met:  07/31/12 Primary Total Hip RIGHT

## 2012-07-31 NOTE — Evaluation (Signed)
Occupational Therapy Evaluation Patient Details Name: Nathaniel Richard MRN: [id number] DOB: 21-Aug-1951 Today's Date: 07/31/2012 Time: 4098-1191 OT Time Calculation (min): 21 min  OT Assessment / Plan / Recommendation Clinical Impression  This 61 year old man was admitted for R posterior approach THA.  All education was completed and pt/wife verbalize understanding.  No further OT is needed.    OT Assessment  Patient does not need any further OT services    Follow Up Recommendations  No OT follow up    Barriers to Discharge      Equipment Recommendations  None recommended by OT;None recommended by PT    Recommendations for Other Services    Frequency       Precautions / Restrictions Precautions Precautions: Posterior Hip Restrictions Weight Bearing Restrictions: No RLE Weight Bearing: Weight bearing as tolerated   Pertinent Vitals/Pain R hip sore: repositioned with ice    ADL  Lower Body Dressing: Performed;Minimal assistance (socks) Toilet Transfer: Simulated;Min guard (chair shower chair) Toilet Transfer Method: Sit to stand Tub/Shower Transfer: Performed;Min guard Tub/Shower Transfer Method: Science writer: Walk in Scientist, research (physical sciences) Used: Reacher;Sock aid;Rolling walker Transfers/Ambulation Related to ADLs: min guard min cues for internal rotation ADL Comments: reviewed thps with ADLs and bathroom transfers.  Pt has reacher but wife will help.  Did not want to practice with pants.  He tried sock aid but does not have this.  Wife will help with this also.  Wife plans to help place soap on lower legs:  pt has a hand held shower.  She will get long sponge if needed.  She had questions about sidelying, which I addressed.  They verbalize understanding of all education    OT Diagnosis:    OT Problem List:   OT Treatment Interventions:     OT Goals    Visit Information  Last OT Received On: 07/31/12 Assistance Needed: +1    Subjective Data  Subjective: "My bed may not be too good for me" Patient Stated Goal: none stated.  Agreeable to OT   Prior Functioning  Vision/Perception  Home Living Lives With: Spouse Bathroom Shower/Tub: Walk-in shower Bathroom Toilet: Handicapped height Home Adaptive Equipment: Environmental consultant - rolling Prior Function Level of Independence: Independent Able to Take Stairs?: Yes Driving: Yes Communication Communication: No difficulties Dominant Hand: Right      Cognition  Overall Cognitive Status: Appears within functional limits for tasks assessed/performed Arousal/Alertness: Awake/alert Orientation Level: Appears intact for tasks assessed    Extremity/Trunk Assessment Right Upper Extremity Assessment RUE ROM/Strength/Tone: [facility] for tasks assessed Left Upper Extremity Assessment LUE ROM/Strength/Tone: WFL for tasks assessed   Mobility  Shoulder Instructions  Transfers Sit to Stand: 4: Min guard Details for Transfer Assistance: one vc for RLE placement       Exercise     Balance     End of Session OT - End of Session Activity Tolerance: Patient tolerated treatment well Patient left: in chair;with call bell/phone within reach;with family/visitor present  GO     Karcyn Menn 07/31/2012, 10:24 AM Marica Otter, OTR/L [phone] 07/31/2012

## 2012-07-31 NOTE — Care Management Note (Signed)
    Page 1 of 1   07/31/2012     10:50:49 AM   CARE MANAGEMENT NOTE 07/31/2012  Patient:  Nathaniel Richard, Nathaniel Richard   Account Number:  [id number]  Date Initiated:  07/31/2012  Documentation initiated by:  Lorenda Ishihara  Subjective/Objective Assessment:   61 yo male admitted s/p RightTotal Hip Arthroplasty. PTA lived at home with spouse.     Action/Plan:   HH services prearranged with [facility].   Anticipated DC Date:  07/31/2012   Anticipated DC Plan:  HOME W HOME HEALTH SERVICES      DC Planning Services  CM consult      Choice offered to / List presented to:          [facility] arranged  HH-2 PT      [facility] agency  [facility]   Status of service:  Completed, signed off Medicare Important Message given?   (If response is "NO", the following Medicare IM given date fields will be blank) Date Medicare IM given:   Date Additional Medicare IM given:    Discharge Disposition:  HOME W HOME HEALTH SERVICES  Per UR Regulation:  Reviewed for med. necessity/level of care/duration of stay  If discussed at Long Length of Stay Meetings, dates discussed:    Comments:

## 2012-07-31 NOTE — Progress Notes (Signed)
Physical Therapy Treatment Patient Details Name: Nathaniel Richard MRN: [id number] DOB: 21-Dec-1950 Today's Date: 07/31/2012 Time: 4098-1191 PT Time Calculation (min): 40 min  PT Assessment / Plan / Recommendation Comments on Treatment Session       Follow Up Recommendations  Home health PT    Barriers to Discharge        Equipment Recommendations  None recommended by OT;None recommended by PT    Recommendations for Other Services OT consult  Frequency 7X/week   Plan Discharge plan remains appropriate    Precautions / Restrictions Precautions Precautions: Posterior Hip Precaution Comments: pt recalls all THP with min cueing Restrictions Weight Bearing Restrictions: No RLE Weight Bearing: Weight bearing as tolerated   Pertinent Vitals/Pain 3/10; premedicated    Mobility  Transfers Transfers: Sit to Stand;Stand to Sit Sit to Stand: 4: Min guard Stand to Sit: 4: Min guard Details for Transfer Assistance: one vc for RLE placement Ambulation/Gait Ambulation/Gait Assistance: 4: Min guard;5: Supervision Ambulation Distance (Feet): 200 Feet Assistive device: Rolling walker Ambulation/Gait Assistance Details: min cues for sequence, position from RW and ER on R Gait Pattern: Step-to pattern Stairs: Yes (4 bkwd with RW, 4 fwd with crutches, 1 with RW fwd) Stairs Assistance: 4: Min assist Stairs Assistance Details (indicate cue type and reason): cues for sequence and foot/crutch/RW placement Stair Management Technique: No rails;Backwards;Forwards;With walker;With crutches Number of Stairs: 4  (4 +4+1)    Exercises     PT Diagnosis:    PT Problem List:   PT Treatment Interventions:     PT Goals Acute Rehab PT Goals PT Goal Formulation: With patient Time For Goal Achievement: 08/04/12 Potential to Achieve Goals: Good Pt will go Supine/Side to Sit: with supervision PT Goal: Supine/Side to Sit - Progress: Progressing toward goal Pt will go Sit to Supine/Side: with  supervision PT Goal: Sit to Supine/Side - Progress: Progressing toward goal Pt will go Sit to Stand: with supervision PT Goal: Sit to Stand - Progress: Progressing toward goal Pt will go Stand to Sit: with supervision PT Goal: Stand to Sit - Progress: Progressing toward goal Pt will Ambulate: 51 - 150 feet;with supervision;with rolling walker PT Goal: Ambulate - Progress: Met Pt will Go Up / Down Stairs: 3-5 stairs;with min assist;with least restrictive assistive device PT Goal: Up/Down Stairs - Progress: Met  Visit Information  Last PT Received On: 07/31/12 Assistance Needed: +1    Subjective Data  Subjective: I'm going home today after you see me twice Patient Stated Goal: Resume previous lifestyle with decreased pain   Cognition  Overall Cognitive Status: Appears within functional limits for tasks assessed/performed Arousal/Alertness: Awake/alert Orientation Level: Appears intact for tasks assessed Behavior During Session: [facility] for tasks performed    Balance     End of Session PT - End of Session Activity Tolerance: Patient tolerated treatment well Patient left: with call bell/phone within reach;with family/visitor present;in bed Nurse Communication: Mobility status   GP     Yitzchak Kothari 07/31/2012, 4:38 PM

## 2012-07-31 NOTE — Progress Notes (Signed)
Discharge summary sent to payer through [facility]  

## 2012-07-31 NOTE — Discharge Summary (Signed)
Physician Discharge Summary   Patient ID: Nathaniel Richard MRN: [id number] DOB/AGE: 62-Sep-1952 62 y.o.  Admit date: 07/29/2012 Discharge date: 07/31/2012  Primary Diagnosis: Osteoarthritis Right hip   Admission Diagnoses:  Past Medical History  Diagnosis Date  . Hypertension   . Dysrhythmia   . Pacemaker   . Heart murmur   . Arthritis    Discharge Diagnoses:   Principal Problem:  *OA (osteoarthritis) of hip  Procedure: Procedure(s) (LRB): TOTAL HIP ARTHROPLASTY (Right)   Consults: None  HPI: Nathaniel Richard is a 61 y.o. male with end stage arthritis of his right hip with progressively worsening pain and dysfunction. Pain occurs with activity and rest including pain at night. He has tried analgesics, protected weight bearing and rest without benefit. Pain is too severe to attempt physical therapy. Radiographs demonstrate bone on bone arthritis with subchondral cyst formation. He presents now for right THA.  Laboratory Data: Hospital Outpatient Visit on 07/23/2012  Component Date Value Range Status  . aPTT 07/23/2012 32  24 - 37 seconds Final  . Sodium 07/23/2012 137  135 - 145 mEq/L Final  . Potassium 07/23/2012 4.1  3.5 - 5.1 mEq/L Final  . Chloride 07/23/2012 102  96 - 112 mEq/L Final  . CO2 07/23/2012 27  19 - 32 mEq/L Final  . Glucose, Bld 07/23/2012 95  70 - 99 mg/dL Final  . BUN 40/98/1191 17  6 - 23 mg/dL Final  . Creatinine, Ser 07/23/2012 0.96  0.50 - 1.35 mg/dL Final  . Calcium 47/82/9562 9.5  8.4 - 10.5 mg/dL Final  . Total Protein 07/23/2012 6.9  6.0 - 8.3 g/dL Final  . Albumin 13/07/6577 3.8  3.5 - 5.2 g/dL Final  . AST 46/96/2952 32  0 - 37 U/L Final  . ALT 07/23/2012 28  0 - 53 U/L Final  . Alkaline Phosphatase 07/23/2012 57  39 - 117 U/L Final  . Total Bilirubin 07/23/2012 0.6  0.3 - 1.2 mg/dL Final  . GFR calc non Af Amer 07/23/2012 87* >90 mL/min Final  . GFR calc Af Amer 07/23/2012 >90  >90 mL/min Final   Comment:                                 The  eGFR has been calculated                          using the CKD EPI equation.                          This calculation has not been                          validated in all clinical                          situations.                          eGFR's persistently                          <90 mL/min signify                          possible  Chronic Kidney Disease.  Marland Kitchen Prothrombin Time 07/23/2012 12.8  11.6 - 15.2 seconds Final  . INR 07/23/2012 0.94  0.00 - 1.49 Final  . Color, Urine 07/23/2012 YELLOW  YELLOW Final  . APPearance 07/23/2012 CLEAR  CLEAR Final  . Specific Gravity, Urine 07/23/2012 1.010  1.005 - 1.030 Final  . pH 07/23/2012 7.0  5.0 - 8.0 Final  . Glucose, UA 07/23/2012 NEGATIVE  NEGATIVE mg/dL Final  . Hgb urine dipstick 07/23/2012 NEGATIVE  NEGATIVE Final  . Bilirubin Urine 07/23/2012 NEGATIVE  NEGATIVE Final  . Ketones, ur 07/23/2012 NEGATIVE  NEGATIVE mg/dL Final  . Protein, ur 04/54/0981 NEGATIVE  NEGATIVE mg/dL Final  . Urobilinogen, UA 07/23/2012 0.2  0.0 - 1.0 mg/dL Final  . Nitrite 19/14/7829 NEGATIVE  NEGATIVE Final  . Leukocytes, UA 07/23/2012 NEGATIVE  NEGATIVE Final   MICROSCOPIC NOT DONE ON URINES WITH NEGATIVE PROTEIN, BLOOD, LEUKOCYTES, NITRITE, OR GLUCOSE <1000 mg/dL.  . WBC 07/23/2012 6.6  4.0 - 10.5 K/uL Final  . RBC 07/23/2012 4.99  4.22 - 5.81 MIL/uL Final  . Hemoglobin 07/23/2012 15.4  13.0 - 17.0 g/dL Final  . HCT 56/21/3086 44.2  39.0 - 52.0 % Final  . MCV 07/23/2012 88.6  78.0 - 100.0 fL Final  . MCH 07/23/2012 30.9  26.0 - 34.0 pg Final  . MCHC 07/23/2012 34.8  30.0 - 36.0 g/dL Final  . RDW 57/84/6962 13.6  11.5 - 15.5 % Final  . Platelets 07/23/2012 208  150 - 400 K/uL Final  . Neutrophils Relative 07/23/2012 50  43 - 77 % Final  . Neutro Abs 07/23/2012 3.3  1.7 - 7.7 K/uL Final  . Lymphocytes Relative 07/23/2012 32  12 - 46 % Final  . Lymphs Abs 07/23/2012 2.1  0.7 - 4.0 K/uL Final  . Monocytes Relative 07/23/2012 12  3 - 12 % Final  .  Monocytes Absolute 07/23/2012 0.8  0.1 - 1.0 K/uL Final  . Eosinophils Relative 07/23/2012 6* 0 - 5 % Final  . Eosinophils Absolute 07/23/2012 0.4  0.0 - 0.7 K/uL Final  . Basophils Relative 07/23/2012 1  0 - 1 % Final  . Basophils Absolute 07/23/2012 0.0  0.0 - 0.1 K/uL Final  . MRSA, PCR 07/23/2012 NEGATIVE  NEGATIVE Final  . Staphylococcus aureus 07/23/2012 POSITIVE* NEGATIVE Final   Comment:                                 The Xpert SA Assay (FDA                          approved for NASAL specimens                          in patients over 40 years of age),                          is one component of                          a comprehensive surveillance                          program.  Test performance has  been validated by [facility] for patients greater                          than or equal to 32 year old.                          It is not intended                          to diagnose infection nor to                          guide or monitor treatment.    Basename 07/31/12 0430 07/30/12 0430  HGB 12.9* 13.1    Basename 07/31/12 0430 07/30/12 0430  WBC 12.7* 14.1*  RBC 4.21* 4.27  HCT 37.0* 36.9*  PLT 220 215    Basename 07/31/12 0430 07/30/12 0430  NA 136 137  K 3.9 4.4  CL 102 105  CO2 26 25  BUN 13 14  CREATININE 1.02 0.97  GLUCOSE 102* 168*  CALCIUM 8.4 8.7   No results found for this basename: LABPT:2,INR:2 in the last 72 hours  X-Rays:Dg Chest 2 View  07/23/2012  *RADIOLOGY REPORT*  Clinical Data: Preop for hip replacement  CHEST - 2 VIEW  Comparison: None.  Findings: Cardiomediastinal silhouette is unremarkable.  Dual lead cardiac pacemaker noted with leads in right atrium and right ventricle.  No acute infiltrate or pleural effusion.  No pulmonary edema.  Bony thorax is unremarkable.  IMPRESSION: No active disease.   Original Report Authenticated By: Natasha Mead, M.D.    Dg Pelvis  Portable  07/29/2012  *RADIOLOGY REPORT*  Clinical Data: Postoperative radiograph.  Right hip replacement.  PORTABLE PELVIS  Comparison: None.  Findings: There is a new right total hip arthroplasty.  The hip appears located.  No periprosthetic fracture is identified.  The expected postoperative changes and soft tissues.  IMPRESSION: Uncomplicated new right total hip arthroplasty.   Original Report Authenticated By: Andreas Newport, M.D.    Dg Hip Portable 1 View Right  07/29/2012  *RADIOLOGY REPORT*  Clinical Data: Right total hip replacement.  PORTABLE RIGHT HIP - 1 VIEW  Comparison: None.  Findings: Uncomplicated new right total hip arthroplasty is present.  No periprosthetic fracture.  Distal femoral stem visualized.  Hip appears located.  IMPRESSION:  Uncomplicated new right total hip arthroplasty.   Original Report Authenticated By: Andreas Newport, M.D.     EKG: Orders placed during the hospital encounter of 07/23/12  . EKG 12-LEAD  . EKG 12-LEAD     Hospital Course: Patient was admitted to [facility] and taken to the OR and underwent the above state procedure without complications.  Patient tolerated the procedure well and was later transferred to the recovery room and then to the orthopaedic floor for postoperative care.  They were given PO and IV analgesics for pain control following their surgery.  They were given 24 hours of postoperative antibiotics and started on DVT prophylaxis in the form of Xarelto.   PT and OT were ordered for total hip protocol.  The patient was allowed to be WBAT with therapy. Discharge planning was consulted to help with postop disposition and equipment needs.  Patient had a dcent night on the evening of surgery and started to get up OOB with therapy on day one.  Hemovac drain was pulled without difficulty.  The knee immobilizer was removed and discontinued.  Continued to work with therapy into day two and walked over 200 feet..  Dressing was changed on day  two and the incision was healing well. Patient was seen in rounds and was it was felt that the patient would be ready to go home on the afternoon of POD 2.  Discharge Medications: Prior to Admission medications   Medication Sig Start Date End Date Taking? Authorizing Provider  lisinopril (PRINIVIL,ZESTRIL) 10 MG tablet Take 10 mg by mouth every morning.   Yes Historical Provider, MD  mometasone (NASONEX) 50 MCG/ACT nasal spray Place 2 sprays into the nose daily. As needed   Yes Historical Provider, MD  cetirizine (ZYRTEC) 10 MG tablet Take 10 mg by mouth daily. Daily as needed    Historical Provider, MD  methocarbamol (ROBAXIN) 500 MG tablet Take 1 tablet (500 mg total) by mouth every 6 (six) hours as needed. 07/31/12 08/10/12  Alexzandrew Perkins, PA  oxyCODONE (OXY IR/ROXICODONE) 5 MG immediate release tablet Take 1-2 tablets (5-10 mg total) by mouth every 4 (four) hours as needed for pain. 07/31/12 08/10/12  Alexzandrew Julien Girt, PA  rivaroxaban (XARELTO) 10 MG TABS tablet Take 1 tablet (10 mg total) by mouth daily with breakfast. 07/31/12   Alexzandrew Julien Girt, PA    Diet: Cardiac diet Activity:WBAT No bending hip over 90 degrees- A "L" Angle Do not cross legs Do not let foot roll inward When turning these patients a pillow should be placed between the patient's legs to prevent crossing. Patients should have the affected knee fully extended when trying to sit or stand from all surfaces to prevent excessive hip flexion. When ambulating and turning toward the affected side the affected leg should have the toes turned out prior to moving the walker and the rest of patient's body as to prevent internal rotation/ turning in of the leg. Abduction pillows are the most effective way to prevent a patient from not crossing legs or turning toes in at rest. If an abduction pillow is not ordered placing a regular pillow length wise between the patient's legs is also an effective reminder. It is imperative that  these precautions be maintained so that the surgical hip does not dislocate. Follow-up:in 2 weeks Disposition - Home Discharged Condition: good   Discharge Orders    Future Orders Please Complete By Expires   Diet - low sodium heart healthy      Call MD / Call 911      Comments:   If you experience chest pain or shortness of breath, CALL 911 and be transported to the hospital emergency room.  If you develope a fever above 101 F, pus (white drainage) or increased drainage or redness at the wound, or calf pain, call your surgeon's office.   Discharge instructions      Comments:   Pick up stool softner and laxative for home. Do not submerge incision under water. May shower. Continue to use ice for pain and swelling from surgery. Hip precautions.  Total Hip Protocol.  Take Xarelto for two and a half more weeks, then discontinue Xarelto.   Constipation Prevention      Comments:   Drink plenty of fluids.  Prune juice may be helpful.  You may use a stool softener, such as Colace (over the counter) 100 mg  twice a day.  Use MiraLax (over the counter) for constipation as needed.   Increase activity slowly as tolerated      Patient may shower      Comments:   You may shower without a dressing once there is no drainage.  Do not wash over the wound.  If drainage remains, do not shower until drainage stops.   Driving restrictions      Comments:   No driving until released by the physician.   Lifting restrictions      Comments:   No lifting until released by the physician.   Follow the hip precautions as taught in Physical Therapy      Change dressing      Comments:   You may change your dressing dressing daily with sterile 4 x 4 inch gauze dressing and paper tape.  Do not submerge the incision under water.   TED hose      Comments:   Use stockings (TED hose) for 3 weeks on both leg(s).  You may remove them at night for sleeping.   Do not sit on low chairs, stoools or toilet seats, as it  may be difficult to get up from low surfaces        Medication List  As of 07/31/2012  8:04 AM   STOP taking these medications         ibuprofen 200 MG tablet         TAKE these medications         cetirizine 10 MG tablet   Commonly known as: ZYRTEC   Take 10 mg by mouth daily. Daily as needed      lisinopril 10 MG tablet   Commonly known as: PRINIVIL,ZESTRIL   Take 10 mg by mouth every morning.      methocarbamol 500 MG tablet   Commonly known as: ROBAXIN   Take 1 tablet (500 mg total) by mouth every 6 (six) hours as needed.      mometasone 50 MCG/ACT nasal spray   Commonly known as: NASONEX   Place 2 sprays into the nose daily. As needed      oxyCODONE 5 MG immediate release tablet   Commonly known as: Oxy IR/ROXICODONE   Take 1-2 tablets (5-10 mg total) by mouth every 4 (four) hours as needed for pain.      rivaroxaban 10 MG Tabs tablet   Commonly known as: XARELTO   Take 1 tablet (10 mg total) by mouth daily with breakfast.           Follow-up Information    Follow up with Loanne Drilling, MD. Schedule an appointment as soon as possible for a visit in 2 weeks.   Contact information:   [facility] [address], [address] [place] [address] [phone]          Signed: Patrica Richard 07/31/2012, 8:04 AM

## 2012-07-31 NOTE — Progress Notes (Signed)
   Subjective: 2 Days Post-Op Procedure(s) (LRB): TOTAL HIP ARTHROPLASTY (Right) Patient reports pain as mild.   Patient seen in rounds for Dr. Lequita Halt.  Wife in room. Patient is well, and has had no acute complaints or problems Patient is ready to go home.  Objective: Vital signs in last 24 hours: Temp:  [97.9 F (36.6 C)-99.3 F (37.4 C)] 99.3 F (37.4 C) (08/30 0527) Pulse Rate:  [63-78] 78  (08/30 0527) Resp:  [14-22] 18  (08/30 0527) BP: (108-124)/(69-78) 108/75 mmHg (08/30 0527) SpO2:  [93 %-97 %] 93 % (08/30 0527)  Intake/Output from previous day:  Intake/Output Summary (Last 24 hours) at 07/31/12 0801 Last data filed at 07/31/12 0528  Gross per 24 hour  Intake 4285.33 ml  Output   3625 ml  Net 660.33 ml    Intake/Output this shift:    Labs:  Basename 07/31/12 0430 07/30/12 0430  HGB 12.9* 13.1    Basename 07/31/12 0430 07/30/12 0430  WBC 12.7* 14.1*  RBC 4.21* 4.27  HCT 37.0* 36.9*  PLT 220 215    Basename 07/31/12 0430 07/30/12 0430  NA 136 137  K 3.9 4.4  CL 102 105  CO2 26 25  BUN 13 14  CREATININE 1.02 0.97  GLUCOSE 102* 168*  CALCIUM 8.4 8.7   No results found for this basename: LABPT:2,INR:2 in the last 72 hours  EXAM: General - Patient is Alert, Appropriate and Oriented Extremity - Neurovascular intact Sensation intact distally Dorsiflexion/Plantar flexion intact No cellulitis present Incision - clean, dry, no drainage, healing Motor Function - intact, moving foot and toes well on exam.   Assessment/Plan: 2 Days Post-Op Procedure(s) (LRB): TOTAL HIP ARTHROPLASTY (Right) Procedure(s) (LRB): TOTAL HIP ARTHROPLASTY (Right) Past Medical History  Diagnosis Date  . Hypertension   . Dysrhythmia   . Pacemaker   . Heart murmur   . Arthritis    Principal Problem:  *OA (osteoarthritis) of hip   Up with therapy Discharge home with home health Diet - Cardiac diet Follow up - in 2weeks Activity - WBAT Disposition -  Home Condition Upon Discharge - Good D/C Meds - See DC Summary DVT Prophylaxis - Xarelto  PERKINS, ALEXZANDREW 07/31/2012, 8:01 AM

## 2015-06-29 ENCOUNTER — Encounter: Payer: Self-pay | Admitting: Cardiology

## 2015-11-07 DIAGNOSIS — I10 Essential (primary) hypertension: Secondary | ICD-10-CM | POA: Insufficient documentation

## 2015-11-07 DIAGNOSIS — M1990 Unspecified osteoarthritis, unspecified site: Secondary | ICD-10-CM | POA: Insufficient documentation

## 2015-11-07 DIAGNOSIS — R011 Cardiac murmur, unspecified: Secondary | ICD-10-CM | POA: Insufficient documentation

## 2015-11-07 DIAGNOSIS — I499 Cardiac arrhythmia, unspecified: Secondary | ICD-10-CM | POA: Insufficient documentation

## 2015-11-07 DIAGNOSIS — Z950 Presence of cardiac pacemaker: Secondary | ICD-10-CM | POA: Insufficient documentation

## 2015-11-08 ENCOUNTER — Telehealth: Payer: Self-pay

## 2015-11-08 ENCOUNTER — Telehealth: Payer: Self-pay | Admitting: Cardiology

## 2015-11-08 NOTE — Telephone Encounter (Signed)
Received records from [facility] for appointment on 11/09/15 with Dr Stanford Breed.  Records given to [facility] (medical records) for Dr Jacalyn Lefevre schedule on 11/09/15. lp

## 2015-11-08 NOTE — Progress Notes (Signed)
     HPI: 64 year old male for evaluation of AVR. Patient had a pacemaker placed 8 years ago in [facility] by report.Patient had aortic valve replacement in December 2014 at [facility]. He has been followed in [facility] and now would prefer to be cared for here. His last echocardiogram was in August 2016. He had normal LV function with moderate left ventricular hypertrophy. There was a bioprosthetic aortic valve with trace aortic insufficiency. There is mild tricuspid regurgitation. Note he states he was told at [facility] that he had a dilated thoracic aorta that would need follow-up in the future. He does not have dyspnea on exertion, orthopnea, PND, pedal edema, exertional chest pain or syncope.  Current Outpatient Prescriptions  Medication Sig Dispense Refill  . aspirin 81 MG tablet Take 81 mg by mouth daily.    . cetirizine (ZYRTEC) 10 MG tablet Take 10 mg by mouth daily. Daily as needed    . metoprolol succinate (TOPROL-XL) 50 MG 24 hr tablet Take 50 mg by mouth daily.  12  . mometasone (NASONEX) 50 MCG/ACT nasal spray Place 2 sprays into the nose daily. As needed     No current facility-administered medications for this visit.    Allergies  Allergen Reactions  . Robaxin [Methocarbamol] Other (See Comments)    Psychotic episodes.     Past Medical History  Diagnosis Date  . Hypertension   . Pacemaker   . Arthritis     Past Surgical History  Procedure Laterality Date  . Insert / replace / remove pacemaker    . Hemorrhoid surgery    . Total hip arthroplasty  07/29/2012    Procedure: TOTAL HIP ARTHROPLASTY;  Surgeon: Gearlean Alf, MD;  Location: [facility] ORS;  Service: Orthopedics;  Laterality: Right;  . Aortic valve replacement      Social History   Social History  . Marital Status: Married    Spouse Name: N/A  . Number of Children: 2  . Years of Education: N/A   Occupational History  . Not on file.   Social History Main Topics  . Smoking status: Never Smoker   .  Smokeless tobacco: Never Used  . Alcohol Use: No  . Drug Use: No  . Sexual Activity: Not on file   Other Topics Concern  . Not on file   Social History Narrative    Family History  Problem Relation Age of Onset  . Diabetes Mother   . Hypertension Mother   . Dementia Mother   . Heart failure Father   . Hypertension Father     ROS: problems with ED but no fevers or chills, productive cough, hemoptysis, dysphasia, odynophagia, melena, hematochezia, dysuria, hematuria, rash, seizure activity, orthopnea, PND, pedal edema, claudication. Remaining systems are negative.  Physical Exam:   Blood pressure 148/92, pulse 79, height 5\' 9"  (1.753 m), weight 185 lb (83.915 kg).  General:  Well developed/well nourished in NAD Skin warm/dry Patient not depressed No peripheral clubbing Back-normal HEENT-normal/normal eyelids Neck supple/normal carotid upstroke bilaterally; no bruits; no JVD; no thyromegaly chest - CTA/ normal expansion CV - RRR/normal S1 and S2; no rubs or gallops;  PMI nondisplaced; 2/6 systolic murmur left sternal border. No diastolic murmur. Abdomen -NT/ND, no HSM, no mass, + bowel sounds, no bruit 2+ femoral pulses, no bruits Ext-no edema, chords, 2+ DP Neuro-grossly nonfocal  ECG Sinus rhythm with ventricular pacing.

## 2015-11-08 NOTE — Telephone Encounter (Signed)
FAXED NOTES TO [facility] 

## 2015-11-09 ENCOUNTER — Encounter: Payer: Self-pay | Admitting: Cardiology

## 2015-11-09 ENCOUNTER — Ambulatory Visit (INDEPENDENT_AMBULATORY_CARE_PROVIDER_SITE_OTHER): Payer: Medicare Other | Admitting: Cardiology

## 2015-11-09 VITALS — BP 148/92 | HR 79 | Ht 69.0 in | Wt 185.0 lb

## 2015-11-09 DIAGNOSIS — Z950 Presence of cardiac pacemaker: Secondary | ICD-10-CM

## 2015-11-09 DIAGNOSIS — I712 Thoracic aortic aneurysm, without rupture, unspecified: Secondary | ICD-10-CM | POA: Insufficient documentation

## 2015-11-09 DIAGNOSIS — I10 Essential (primary) hypertension: Secondary | ICD-10-CM | POA: Diagnosis not present

## 2015-11-09 DIAGNOSIS — Z952 Presence of prosthetic heart valve: Secondary | ICD-10-CM | POA: Insufficient documentation

## 2015-11-09 DIAGNOSIS — Z954 Presence of other heart-valve replacement: Secondary | ICD-10-CM

## 2015-11-09 DIAGNOSIS — N529 Male erectile dysfunction, unspecified: Secondary | ICD-10-CM | POA: Insufficient documentation

## 2015-11-09 MED ORDER — AMLODIPINE BESYLATE 5 MG PO TABS: 5.0000 mg | ORAL_TABLET | Freq: Every day | ORAL | Status: DC

## 2015-11-09 NOTE — Patient Instructions (Signed)
Medication Instructions:   TAKE METOPROLOL 25 MG ONCE DAILY X 3 DAYS AND THEN STOP  START AMLODIPINE 5 MG ONCE DAILY  Labwork:  Your physician recommends that you HAVE LAB WORK TODAY  Testing/Procedures:  CT A OF CHEST W/WO TO FOLLOW UP THORACIC ANEURYSM  Follow-Up:  Your physician wants you to follow-up in: Bright will receive a reminder letter in the mail two months in advance. If you don't receive a letter, please call our office to schedule the follow-up appointment.   REFERRAL TO EP TO ESTABLISH PACEMAKER CHECKS=MEDTRONIC  If you need a refill on your cardiac medications before your next appointment, please call your pharmacy.

## 2015-11-09 NOTE — Assessment & Plan Note (Signed)
Patient's aortic valve was replaced because it was bicuspid. He was told at [facility] that his thoracic aorta was also dilated and we need follow-up. We will arrange a CTA to further assess.

## 2015-11-09 NOTE — Assessment & Plan Note (Signed)
Refer to electrophysiology To follow pacemaker.

## 2015-11-09 NOTE — Assessment & Plan Note (Signed)
Continue SBE prophylaxis. 

## 2015-11-09 NOTE — Assessment & Plan Note (Signed)
If not improved after discontinuing metoprolol we will provide cialis.

## 2015-11-09 NOTE — Assessment & Plan Note (Signed)
Patient has had problems with erectile dysfunction. We will wean his Toprol to off. He will take 25 mg daily for 3 days and then discontinue. Add Norvasc 5 mg daily and titrate based on follow-up blood pressure.

## 2015-11-10 LAB — BASIC METABOLIC PANEL
BUN: 18 mg/dL (ref 7–25)
CO2: 26 mmol/L (ref 20–31)
Calcium: 9.2 mg/dL (ref 8.6–10.3)
Chloride: 104 mmol/L (ref 98–110)
Creat: 1.05 mg/dL (ref 0.70–1.25)
GLUCOSE: 122 mg/dL — AB (ref 65–99)
Potassium: 4.4 mmol/L (ref 3.5–5.3)
Sodium: 140 mmol/L (ref 135–146)

## 2015-11-16 ENCOUNTER — Encounter: Payer: Self-pay | Admitting: Cardiology

## 2015-11-16 ENCOUNTER — Ambulatory Visit (INDEPENDENT_AMBULATORY_CARE_PROVIDER_SITE_OTHER): Payer: Medicare Other | Admitting: *Deleted

## 2015-11-16 DIAGNOSIS — Z950 Presence of cardiac pacemaker: Secondary | ICD-10-CM

## 2015-11-16 LAB — CUP PACEART INCLINIC DEVICE CHECK
Battery Voltage: 2.88 V
Brady Statistic AP VS Percent: 0 %
Brady Statistic AS VS Percent: 0 %
Implantable Lead Implant Date: 20080903
Implantable Lead Location: 753859
Implantable Lead Location: 753860
Implantable Lead Model: 5594
Lead Channel Impedance Value: 496 Ohm
Lead Channel Pacing Threshold Amplitude: 1 V
Lead Channel Pacing Threshold Pulse Width: 0.4 ms
Lead Channel Sensing Intrinsic Amplitude: 4.488 mV
Lead Channel Setting Pacing Amplitude: 2 V
Lead Channel Setting Pacing Pulse Width: 0.4 ms
Lead Channel Setting Sensing Sensitivity: 0.9 mV
MDC IDC LEAD IMPLANT DT: 20080903
MDC IDC MSMT LEADCHNL RA IMPEDANCE VALUE: 432 Ohm
MDC IDC MSMT LEADCHNL RA PACING THRESHOLD AMPLITUDE: 0.5 V
MDC IDC MSMT LEADCHNL RA PACING THRESHOLD PULSEWIDTH: 0.4 ms
MDC IDC SESS DTM: 20161215151714
MDC IDC SET LEADCHNL RV PACING AMPLITUDE: 2.5 V
MDC IDC STAT BRADY AP VP PERCENT: 42.18 %
MDC IDC STAT BRADY AS VP PERCENT: 57.82 %
MDC IDC STAT BRADY RA PERCENT PACED: 42.18 %
MDC IDC STAT BRADY RV PERCENT PACED: 100 %

## 2015-11-16 NOTE — Progress Notes (Signed)
Pacemaker check in clinic to establish. Normal device function. Thresholds, sensing, impedances consistent with previous measurements. Device programmed to maximize longevity. No mode switch or high ventricular rates noted. Device programmed at appropriate safety margins. Histogram distribution appropriate for patient activity level. Device programmed to optimize intrinsic conduction. Remaining power: 2.88V, ERI= 2.81V. Patient enrolled in remote follow-up- requested [facility] transfer from [facility]. ROV with WC 02/19/16.

## 2015-11-17 ENCOUNTER — Ambulatory Visit (HOSPITAL_COMMUNITY)
Admission: RE | Admit: 2015-11-17 | Discharge: 2015-11-17 | Disposition: A | Discharge status: Home / Self Care | Payer: Medicare Other | Source: Ambulatory Visit | Attending: Cardiology | Admitting: Cardiology

## 2015-11-17 DIAGNOSIS — Z954 Presence of other heart-valve replacement: Secondary | ICD-10-CM | POA: Diagnosis not present

## 2015-11-17 DIAGNOSIS — I712 Thoracic aortic aneurysm, without rupture, unspecified: Secondary | ICD-10-CM

## 2015-11-17 DIAGNOSIS — I77810 Thoracic aortic ectasia: Secondary | ICD-10-CM | POA: Insufficient documentation

## 2015-11-17 MED ORDER — IOHEXOL 350 MG/ML SOLN
100.0000 mL | Freq: Once | INTRAVENOUS | Status: AC | PRN
  Administered 2015-11-17: 100 mL via INTRAVENOUS

## 2016-01-09 ENCOUNTER — Telehealth: Payer: Self-pay | Admitting: Cardiology

## 2016-01-09 MED ORDER — TADALAFIL 10 MG PO TABS: 10.0000 mg | ORAL_TABLET | Freq: Every day | ORAL | Status: DC | PRN

## 2016-01-09 NOTE — Telephone Encounter (Signed)
Question: Changed from metoprolol to amlodipine because of ED Patient said that it is not helping his ED and would like to know if Dr. Stanford Breed would order Cialis or Viagra

## 2016-01-09 NOTE — Telephone Encounter (Signed)
cialis 10 mg as needed Kirk Ruths

## 2016-01-09 NOTE — Telephone Encounter (Signed)
Spoke with pt, New script sent to the pharmacy  

## 2016-01-09 NOTE — Telephone Encounter (Signed)
New message    Pt c/o medication issue:  1. Name of Medication: amlodipine  5 mg   2. How are you currently taking this medication (dosage and times per day)? In am    3. Are you having a reaction (difficulty breathing--STAT)? No   4. What is your medication issue? The medication has not help - will discuss when the nurse called back

## 2016-02-18 NOTE — Progress Notes (Signed)
Electrophysiology Office Note   Date:  02/19/2016   ID:  Rishikesh, Gorka 03-28-1951, MRN [id number]  PCP:  Nicoletta Dress, MD  Cardiologist:  Stanford Breed Primary Electrophysiologist:  Yomaris Palecek Meredith Leeds, MD    Chief Complaint  Patient presents with  . Pacemaker Check     History of Present Illness: JOSUEL ROTMAN is a 65 y.o. male who presents today for electrophysiology evaluation.   He has a history of pacemaker placed 8 years ago in [facility] by report.Patient had aortic valve replacement in December 2014 at [facility]. He was having extreme fatigue when his pacemaker was placed, with HR in the high 20s at times.  He otherwise has felt well.  Had a hip replacement in 2013 as well which was uncomplicated.   Today, he denies symptoms of palpitations, chest pain, shortness of breath, orthopnea, PND, lower extremity edema, claudication, dizziness, presyncope, syncope, bleeding, or neurologic sequela. The patient is tolerating medications without difficulties and is otherwise without complaint today.    Past Medical History  Diagnosis Date  . Hypertension   . Pacemaker   . Arthritis    Past Surgical History  Procedure Laterality Date  . Insert / replace / remove pacemaker    . Hemorrhoid surgery    . Total hip arthroplasty  07/29/2012    Procedure: TOTAL HIP ARTHROPLASTY;  Surgeon: Gearlean Alf, MD;  Location: [facility];  Service: Orthopedics;  Laterality: Right;  . Aortic valve replacement       Current Outpatient Prescriptions  Medication Sig Dispense Refill  . amLODipine (NORVASC) 5 MG tablet Take 1 tablet (5 mg total) by mouth daily. 180 tablet 3  . aspirin 81 MG tablet Take 81 mg by mouth daily.    . cetirizine (ZYRTEC) 10 MG tablet Take 10 mg by mouth daily. Daily as needed    . mometasone (NASONEX) 50 MCG/ACT nasal spray Place 2 sprays into the nose daily. As needed    . tadalafil (CIALIS) 10 MG tablet Take 1 tablet (10 mg total) by mouth daily as needed  for erectile dysfunction. 10 tablet 0   No current facility-administered medications for this visit.    Allergies:   Robaxin   Social History:  The patient  reports that he has never smoked. He has never used smokeless tobacco. He reports that he does not drink alcohol or use illicit drugs.   Family History:  The patient's family history includes Dementia in his mother; Diabetes in his mother; Heart failure in his father; Hypertension in his father and mother.    ROS:  Please see the history of present illness.   Otherwise, review of systems is positive for none.   All other systems are reviewed and negative.    PHYSICAL EXAM: VS:  BP 142/98 mmHg  Pulse 77  Ht 5\' 9"  (1.753 m)  Wt 189 lb 6.4 oz (85.911 kg)  BMI 27.96 kg/m2 , BMI Body mass index is 27.96 kg/(m^2). GEN: Well nourished, well developed, in no acute distress HEENT: normal Neck: no JVD, carotid bruits, or masses Cardiac: RRR; no murmurs, rubs, or gallops,no edema  Respiratory:  clear to auscultation bilaterally, normal work of breathing GI: soft, nontender, nondistended, + BS MS: no deformity or atrophy Skin: warm and dry,  device pocket is well healed Neuro:  Strength and sensation are intact Psych: euthymic mood, full affect  EKG:  EKG is ordered today. The ekg ordered today shows A sense, V pace  Device interrogation  is reviewed today in detail.  See PaceArt for details.   Recent Labs: 11/09/2015: BUN 18; Creat 1.05; Potassium 4.4; Sodium 140    Lipid Panel  No results found for: CHOL, TRIG, HDL, CHOLHDL, VLDL, LDLCALC, LDLDIRECT   Wt Readings from Last 3 Encounters:  02/19/16 189 lb 6.4 oz (85.911 kg)  11/09/15 185 lb (83.915 kg)  07/29/12 190 lb (86.183 kg)      Other studies Reviewed: Additional studies/ records that were reviewed today include: TTE 07/04/15  Review of the above records today demonstrates:  Bioprosthetic aortic valve, normal LVEF, moderate LVH, EF 60-65%   ASSESSMENT AND  PLAN:  1.  Pacemaker: functioning appropriately.  Have hooked him up to have remote monitoring.  Getting close to generator change.  Continue to monitor.  2. Hypertension: on norvasc.  At home, he says usually in the 120s to 130s.  I have encouraged him to continue to check it at home.  Current medicines are reviewed at length with the patient today.   The patient does not have concerns regarding his medicines.  The following changes were made today:  none  Labs/ tests ordered today include:  No orders of the defined types were placed in this encounter.     Disposition:   FU with Accalia Rigdon 1 year  Signed, Tesneem Dufrane Meredith Leeds, MD  02/19/2016 9:06 AM     [facility] [address] [place] [address] [phone] (office) [phone] (fax)

## 2016-02-19 ENCOUNTER — Ambulatory Visit (INDEPENDENT_AMBULATORY_CARE_PROVIDER_SITE_OTHER): Payer: PPO | Admitting: Cardiology

## 2016-02-19 ENCOUNTER — Encounter: Payer: Self-pay | Admitting: Cardiology

## 2016-02-19 VITALS — BP 142/98 | HR 77 | Ht 69.0 in | Wt 189.4 lb

## 2016-02-19 DIAGNOSIS — I442 Atrioventricular block, complete: Secondary | ICD-10-CM

## 2016-02-19 DIAGNOSIS — Z950 Presence of cardiac pacemaker: Secondary | ICD-10-CM

## 2016-02-19 LAB — CUP PACEART INCLINIC DEVICE CHECK
Brady Statistic RA Percent Paced: 18.31 %
Brady Statistic RV Percent Paced: 100 %
Date Time Interrogation Session: 20170320091210
Implantable Lead Implant Date: 20080903
Implantable Lead Location: 753860
Implantable Lead Model: 5076
Lead Channel Impedance Value: 400 Ohm
Lead Channel Sensing Intrinsic Amplitude: 4.532 mV
Lead Channel Setting Pacing Pulse Width: 0.4 ms
MDC IDC LEAD IMPLANT DT: 20080903
MDC IDC LEAD LOCATION: 753859
MDC IDC MSMT BATTERY VOLTAGE: 2.86 V
MDC IDC MSMT LEADCHNL RA PACING THRESHOLD AMPLITUDE: 0.5 V
MDC IDC MSMT LEADCHNL RA PACING THRESHOLD PULSEWIDTH: 0.4 ms
MDC IDC MSMT LEADCHNL RV IMPEDANCE VALUE: 456 Ohm
MDC IDC MSMT LEADCHNL RV PACING THRESHOLD AMPLITUDE: 1 V
MDC IDC MSMT LEADCHNL RV PACING THRESHOLD PULSEWIDTH: 0.4 ms
MDC IDC MSMT LEADCHNL RV SENSING INTR AMPL: 15.811 mV
MDC IDC SET LEADCHNL RA PACING AMPLITUDE: 2 V
MDC IDC SET LEADCHNL RV PACING AMPLITUDE: 2.5 V
MDC IDC SET LEADCHNL RV SENSING SENSITIVITY: 0.9 mV
MDC IDC STAT BRADY AP VP PERCENT: 18.31 %
MDC IDC STAT BRADY AP VS PERCENT: 0 %
MDC IDC STAT BRADY AS VP PERCENT: 81.69 %
MDC IDC STAT BRADY AS VS PERCENT: 0 %

## 2016-02-19 NOTE — Patient Instructions (Signed)
Medication Instructions: No Change  Labwork: None Ordered  Procedures/Testing: None Ordered  Follow-Up: Your physician wants you to follow-up in 1 year. You will receive a reminder letter in the mail two months in advance. If you don't receive a letter, please call our office to schedule the follow-up appointment.   Remote monitoring is used to monitor your Pacemaker of ICD from home. This monitoring reduces the number of office visits required to check your device to one time per year. It allows [place] to keep an eye on the functioning of your device to ensure it is working properly. You are scheduled for a device check from home on 6/19/ 2017. You may send your transmission at any time that day. If you have a wireless device, the transmission will be sent automatically. After your physician reviews your transmission, you will receive a postcard with your next transmission date.    Any Additional Special Instructions Will Be Listed Below (If Applicable).     If you need a refill on your cardiac medications before your next appointment, please call your pharmacy.

## 2016-05-20 ENCOUNTER — Ambulatory Visit (INDEPENDENT_AMBULATORY_CARE_PROVIDER_SITE_OTHER): Payer: PPO | Admitting: *Deleted

## 2016-05-20 ENCOUNTER — Telehealth: Payer: Self-pay | Admitting: Cardiology

## 2016-05-20 DIAGNOSIS — I442 Atrioventricular block, complete: Secondary | ICD-10-CM | POA: Diagnosis not present

## 2016-05-20 NOTE — Telephone Encounter (Signed)
LMOVM reminding pt to send remote transmission.   

## 2016-05-21 LAB — CUP PACEART REMOTE DEVICE CHECK
Battery Voltage: 2.85 V
Brady Statistic AP VP Percent: 25.7 %
Brady Statistic AS VP Percent: 74.29 %
Brady Statistic AS VS Percent: 0 %
Brady Statistic RA Percent Paced: 25.7 %
Brady Statistic RV Percent Paced: 100 %
Implantable Lead Implant Date: 20080903
Implantable Lead Implant Date: 20080903
Implantable Lead Location: 753860
Implantable Lead Model: 5076
Lead Channel Impedance Value: 400 Ohm
Lead Channel Setting Pacing Amplitude: 2 V
Lead Channel Setting Pacing Pulse Width: 0.4 ms
Lead Channel Setting Sensing Sensitivity: 0.9 mV
MDC IDC LEAD LOCATION: 753859
MDC IDC MSMT LEADCHNL RA SENSING INTR AMPL: 2.684 mV
MDC IDC MSMT LEADCHNL RV IMPEDANCE VALUE: 456 Ohm
MDC IDC SESS DTM: 20170619230916
MDC IDC SET LEADCHNL RV PACING AMPLITUDE: 2.5 V
MDC IDC STAT BRADY AP VS PERCENT: 0 %

## 2016-05-21 NOTE — Progress Notes (Signed)
Remote pacemaker transmission.   

## 2016-05-24 ENCOUNTER — Encounter: Payer: Self-pay | Admitting: Cardiology

## 2016-06-11 NOTE — Progress Notes (Signed)
      HPI: FU AVR. Patient had a pacemaker placed 8 years ago in [facility] by report. Had aortic valve replacement in December 2014 at [facility]. Previously followed in [facility]. Last echocardiogram was in August 2016. He had normal LV function with moderate left ventricular hypertrophy. There was a bioprosthetic aortic valve with trace aortic insufficiency. There is mild tricuspid regurgitation. CTA December 2016 showed ectasia of the descending thoracic aorta at 3.8 cm. Since last seen, the patient denies any dyspnea on exertion, orthopnea, PND, pedal edema, palpitations, syncope or chest pain.   Current Outpatient Prescriptions  Medication Sig Dispense Refill  . amLODipine (NORVASC) 5 MG tablet Take 1 tablet (5 mg total) by mouth daily. 180 tablet 3  . aspirin 81 MG tablet Take 81 mg by mouth daily.    . cetirizine (ZYRTEC) 10 MG tablet Take 10 mg by mouth daily. Daily as needed    . mometasone (NASONEX) 50 MCG/ACT nasal spray Place 2 sprays into the nose daily. As needed     No current facility-administered medications for this visit.     Past Medical History  Diagnosis Date  . Hypertension   . Pacemaker   . Arthritis     Past Surgical History  Procedure Laterality Date  . Insert / replace / remove pacemaker    . Hemorrhoid surgery    . Total hip arthroplasty  07/29/2012    Procedure: TOTAL HIP ARTHROPLASTY;  Surgeon: Gearlean Alf, MD;  Location: [facility];  Service: Orthopedics;  Laterality: Right;  . Aortic valve replacement      Social History   Social History  . Marital Status: Married    Spouse Name: N/A  . Number of Children: 2  . Years of Education: N/A   Occupational History  . Not on file.   Social History Main Topics  . Smoking status: Never Smoker   . Smokeless tobacco: Never Used  . Alcohol Use: No  . Drug Use: No  . Sexual Activity: Not on file   Other Topics Concern  . Not on file   Social History Narrative    Family History    Problem Relation Age of Onset  . Diabetes Mother   . Hypertension Mother   . Dementia Mother   . Heart failure Father   . Hypertension Father     ROS: no fevers or chills, productive cough, hemoptysis, dysphasia, odynophagia, melena, hematochezia, dysuria, hematuria, rash, seizure activity, orthopnea, PND, pedal edema, claudication. Remaining systems are negative.  Physical Exam: Well-developed well-nourished in no acute distress.  Skin is warm and dry.  HEENT is normal.  Neck is supple.  Chest is clear to auscultation with normal expansion.  Cardiovascular exam is regular rate and rhythm. 2/6 systolic murmur left sternal border. No diastolic murmur. Abdominal exam nontender or distended. No masses palpated. Extremities show no edema. neuro grossly intact  ECG 02/19/2016-sinus rhythm with ventricular pacing  A/P  1 Thoracic aortic aneurysm-most recent CTA showed aortic root 3.8 cm. Plan repeat study in December of this year.  2 pacemaker-followed by electrophysiology.  3 hypertension-blood pressure is controlled. Continue present medications.  4 history of aortic valve replacement-continue SBE prophylaxis.  5 erectile dysfunction-we discontinued his beta blocker without improvement and there was no help with Cialis. I have asked him to follow-up with urology.  Kirk Ruths, MD

## 2016-06-13 ENCOUNTER — Encounter: Payer: Self-pay | Admitting: Cardiology

## 2016-06-13 ENCOUNTER — Ambulatory Visit (INDEPENDENT_AMBULATORY_CARE_PROVIDER_SITE_OTHER): Payer: PPO | Admitting: Cardiology

## 2016-06-13 VITALS — BP 128/86 | HR 85 | Ht 69.0 in | Wt 183.0 lb

## 2016-06-13 DIAGNOSIS — Z954 Presence of other heart-valve replacement: Secondary | ICD-10-CM

## 2016-06-13 DIAGNOSIS — I712 Thoracic aortic aneurysm, without rupture, unspecified: Secondary | ICD-10-CM

## 2016-06-13 DIAGNOSIS — I10 Essential (primary) hypertension: Secondary | ICD-10-CM | POA: Diagnosis not present

## 2016-06-13 DIAGNOSIS — Z950 Presence of cardiac pacemaker: Secondary | ICD-10-CM | POA: Diagnosis not present

## 2016-06-13 DIAGNOSIS — Z952 Presence of prosthetic heart valve: Secondary | ICD-10-CM

## 2016-06-13 NOTE — Patient Instructions (Signed)
Medications  NO MEDICATION CHANGES  Procedures  CT TEST IN December, 2017.   Follow Up  Your physician wants you to follow-up in: [place]. You will receive a reminder letter in the mail two months in advance. If you don't receive a letter, please call our office to schedule the follow-up appointment.

## 2016-08-20 ENCOUNTER — Encounter: Payer: PPO | Admitting: *Deleted

## 2016-08-20 ENCOUNTER — Telehealth: Payer: Self-pay | Admitting: Cardiology

## 2016-08-20 NOTE — Telephone Encounter (Signed)
Spoke with pt and reminded pt of remote transmission that is due today. Pt verbalized understanding.   

## 2016-08-23 ENCOUNTER — Encounter: Payer: Self-pay | Admitting: Cardiology

## 2016-10-14 ENCOUNTER — Ambulatory Visit (INDEPENDENT_AMBULATORY_CARE_PROVIDER_SITE_OTHER): Payer: PPO | Admitting: *Deleted

## 2016-10-14 DIAGNOSIS — I442 Atrioventricular block, complete: Secondary | ICD-10-CM | POA: Diagnosis not present

## 2016-10-15 NOTE — Progress Notes (Signed)
Remote pacemaker transmission.   

## 2016-10-25 LAB — CUP PACEART REMOTE DEVICE CHECK
Battery Voltage: 2.82 V
Brady Statistic AP VP Percent: 24.09 %
Brady Statistic AS VS Percent: 0 %
Implantable Lead Implant Date: 20080903
Implantable Lead Location: 753859
Implantable Lead Model: 5594
Lead Channel Impedance Value: 400 Ohm
Lead Channel Impedance Value: 424 Ohm
Lead Channel Sensing Intrinsic Amplitude: 2.684 mV
MDC IDC LEAD IMPLANT DT: 20080903
MDC IDC LEAD LOCATION: 753860
MDC IDC PG IMPLANT DT: 20080903
MDC IDC SESS DTM: 20171113015549
MDC IDC SET LEADCHNL RA PACING AMPLITUDE: 2 V
MDC IDC SET LEADCHNL RV PACING AMPLITUDE: 2.5 V
MDC IDC SET LEADCHNL RV PACING PULSEWIDTH: 0.4 ms
MDC IDC SET LEADCHNL RV SENSING SENSITIVITY: 0.9 mV
MDC IDC STAT BRADY AP VS PERCENT: 0 %
MDC IDC STAT BRADY AS VP PERCENT: 75.91 %
MDC IDC STAT BRADY RA PERCENT PACED: 24.09 %
MDC IDC STAT BRADY RV PERCENT PACED: 100 %

## 2016-11-04 ENCOUNTER — Telehealth: Payer: Self-pay | Admitting: *Deleted

## 2016-11-04 DIAGNOSIS — I712 Thoracic aortic aneurysm, without rupture, unspecified: Secondary | ICD-10-CM

## 2016-11-04 NOTE — Telephone Encounter (Signed)
Spoke with pt, it is time for the repeat CTA to follow his thoracic aneurysm. Pt reports he is due to have blood work drawn next week and will get a copy faxed to [place]. CTA scheduled for 11-18-16 @ 4 pm. Patient voiced understanding of appt time and location. He will be NPO 2 hours prior.

## 2016-11-06 DIAGNOSIS — I10 Essential (primary) hypertension: Secondary | ICD-10-CM | POA: Diagnosis not present

## 2016-11-06 DIAGNOSIS — N529 Male erectile dysfunction, unspecified: Secondary | ICD-10-CM | POA: Diagnosis not present

## 2016-11-06 DIAGNOSIS — Z136 Encounter for screening for cardiovascular disorders: Secondary | ICD-10-CM | POA: Diagnosis not present

## 2016-11-06 DIAGNOSIS — Z0000 Encounter for general adult medical examination without abnormal findings: Secondary | ICD-10-CM | POA: Diagnosis not present

## 2016-11-06 DIAGNOSIS — Z125 Encounter for screening for malignant neoplasm of prostate: Secondary | ICD-10-CM | POA: Diagnosis not present

## 2016-11-06 DIAGNOSIS — Z9181 History of falling: Secondary | ICD-10-CM | POA: Diagnosis not present

## 2016-11-06 DIAGNOSIS — E785 Hyperlipidemia, unspecified: Secondary | ICD-10-CM | POA: Diagnosis not present

## 2016-11-06 DIAGNOSIS — Z79899 Other long term (current) drug therapy: Secondary | ICD-10-CM | POA: Diagnosis not present

## 2016-11-06 DIAGNOSIS — Z1389 Encounter for screening for other disorder: Secondary | ICD-10-CM | POA: Diagnosis not present

## 2016-11-18 ENCOUNTER — Ambulatory Visit (INDEPENDENT_AMBULATORY_CARE_PROVIDER_SITE_OTHER)
Admission: RE | Admit: 2016-11-18 | Discharge: 2016-11-18 | Disposition: A | Discharge status: Home / Self Care | Payer: PPO | Source: Ambulatory Visit | Attending: Cardiology | Admitting: Cardiology

## 2016-11-18 DIAGNOSIS — I712 Thoracic aortic aneurysm, without rupture, unspecified: Secondary | ICD-10-CM

## 2016-11-18 MED ORDER — IOPAMIDOL (ISOVUE-370) INJECTION 76%
100.0000 mL | Freq: Once | INTRAVENOUS | Status: AC | PRN
  Administered 2016-11-18: 100 mL via INTRAVENOUS

## 2016-11-21 ENCOUNTER — Other Ambulatory Visit: Payer: Self-pay | Admitting: *Deleted

## 2016-11-21 DIAGNOSIS — R9389 Abnormal findings on diagnostic imaging of other specified body structures: Secondary | ICD-10-CM

## 2016-11-26 ENCOUNTER — Other Ambulatory Visit: Payer: Self-pay | Admitting: *Deleted

## 2016-11-26 ENCOUNTER — Ambulatory Visit (INDEPENDENT_AMBULATORY_CARE_PROVIDER_SITE_OTHER)
Admission: RE | Admit: 2016-11-26 | Discharge: 2016-11-26 | Disposition: A | Discharge status: Home / Self Care | Payer: PPO | Source: Ambulatory Visit | Attending: Cardiology | Admitting: Cardiology

## 2016-11-26 DIAGNOSIS — R935 Abnormal findings on diagnostic imaging of other abdominal regions, including retroperitoneum: Secondary | ICD-10-CM | POA: Diagnosis not present

## 2016-11-26 DIAGNOSIS — K869 Disease of pancreas, unspecified: Secondary | ICD-10-CM

## 2016-11-26 DIAGNOSIS — R9389 Abnormal findings on diagnostic imaging of other specified body structures: Secondary | ICD-10-CM

## 2016-11-26 DIAGNOSIS — R938 Abnormal findings on diagnostic imaging of other specified body structures: Secondary | ICD-10-CM | POA: Diagnosis not present

## 2016-11-26 MED ORDER — IOPAMIDOL (ISOVUE-370) INJECTION 76%
100.0000 mL | Freq: Once | INTRAVENOUS | Status: AC | PRN
  Administered 2016-11-26: 100 mL via INTRAVENOUS

## 2016-11-27 ENCOUNTER — Telehealth: Payer: Self-pay | Admitting: *Deleted

## 2016-11-27 NOTE — Telephone Encounter (Signed)
Oncology Nurse Navigator Documentation  Oncology Nurse Navigator Flowsheets 11/27/2016  Navigator Location [facility]  Referral date to RadOnc/MedOnc 11/27/2016  Navigator Encounter Type Introductory phone call  Abnormal Finding Date 11/19/2016  Called patient to acknowledge receipt of referral and inquired if he wants to be seen in [place] or at [facility] in [place]. He wishes his oncology care in [place]. Tells RN he has gastroenterologist in [place], Dr. Nehemiah Settle and wishes his practice to perform the EUS bx if this is needed for diagnosis. Informed him that nurse navigator will review with oncologist tomorrow to decide how to proceed.

## 2016-11-29 ENCOUNTER — Telehealth: Payer: Self-pay | Admitting: *Deleted

## 2016-11-29 DIAGNOSIS — K8689 Other specified diseases of pancreas: Secondary | ICD-10-CM

## 2016-11-29 NOTE — Telephone Encounter (Signed)
Oncology Nurse Navigator Documentation  Oncology Nurse Navigator Flowsheets 11/29/2016  Navigator Location [facility]  Referral date to RadOnc/MedOnc -  Navigator Encounter Type Telephone  Telephone Outgoing Call;Appt Confirmation/Clarification  Abnormal Finding Date 11/19/2016  Barriers/Navigation Needs Coordination of Care  Interventions Coordination of Care--urgent referral for Dr. Ardis Hughs for EUS next week. Per Dr. Benay Spice, Dr. Melina Copa in [place] does not do EUS procedure  Coordination of Care EUS  Acuity Level 2  Time Spent with Patient 23  Spoke with patient and provided new patient appointment for 12/12/16 at 2 pm with Dr. Benay Spice. Informed of location of [place], valet service, and registration process. Reminded to bring insurance cards and a current medication list, including supplements. Patient verbalizes understanding. He understands that Dr. Melina Copa does not perform EUS and he is being referred to Dr. Ardis Hughs. Understands the need for tissue diagnosis before seeing medical oncology. Mailed letter to home and notified HIM.

## 2016-12-03 ENCOUNTER — Telehealth: Payer: Self-pay | Admitting: *Deleted

## 2016-12-03 NOTE — Telephone Encounter (Signed)
Oncology Nurse Navigator Documentation  Oncology Nurse Navigator Flowsheets 12/03/2016  Navigator Location [facility]  Referral date to RadOnc/MedOnc -  Navigator Encounter Type Telephone  Telephone Incoming Call;Appt Confirmation/Clarification--called to inquire about appointment with Dr. Ardis Hughs w/GI.  Abnormal Finding Date -  Barriers/Navigation Needs -  Interventions -  Coordination of Care -Informed him that Ardis Hughs was out of the office last week, but has contacted navigator today to confirm receipt of referral. His staff is working on the appointment and will call him. May not be able to scope till 1/18, but will add him on if he has a cancellation. Informed Nathaniel Richard that navigator will keep him up to date.  Acuity -  Time Spent with Patient -

## 2016-12-04 ENCOUNTER — Telehealth: Payer: Self-pay | Admitting: *Deleted

## 2016-12-04 NOTE — Telephone Encounter (Signed)
Called patient to move his new patient visit from 1/11 to 1/16 since his EUS will not be done this week.

## 2016-12-05 ENCOUNTER — Other Ambulatory Visit: Payer: Self-pay

## 2016-12-05 ENCOUNTER — Telehealth: Payer: Self-pay

## 2016-12-05 ENCOUNTER — Telehealth: Payer: Self-pay | Admitting: *Deleted

## 2016-12-05 DIAGNOSIS — R948 Abnormal results of function studies of other organs and systems: Secondary | ICD-10-CM

## 2016-12-05 NOTE — Telephone Encounter (Signed)
Called to report his EUS is scheduled for 12/19/16 with Dr. Ardis Hughs. Rescheduled his new patient visit with Dr. Benay Spice for 12/24/16 at 2 pm.

## 2016-12-05 NOTE — Telephone Encounter (Signed)
-----   Message from Milus Banister, MD sent at 12/02/2016  8:16 AM EST ----- Regarding: RE: Upper EUS [name], Just back from vacation.  I'm happy to help.  It looks like my first available time for EUS would be on the 18th unless I get a cancellation earlier.  I looked at the images and agree with radiology that this may be an aneurism or perhaps Islet Cell.  We'll get in touch with him.  Patty, He needs upper EUS, radial +/- linear, next available EUS Thursday for abnormal pancreas.  ++MAC.  Thanks  ----- Message ----- From: [name], RN Sent: 11/29/2016   8:14 AM To: Milus Banister, MD, Barron Alvine, RN Subject: Upper EUS                                      New 1.1 cm pancreatic head mass. Sees Dr. Benay Spice as new patient on 12/12/16. Would you be able to see him and do EUS w/bx next week? Sees Dr. Melina Copa in [place], but Dr. Benay Spice said he does not perform this procedure. Thanks, [name]

## 2016-12-05 NOTE — Telephone Encounter (Signed)
Patient is scheduled for [facility] for 12/19/16 to arrive at 10:15.  He is notified of recommendations and consents to proceed. I will mail instructions.

## 2016-12-12 ENCOUNTER — Ambulatory Visit: Payer: PPO | Admitting: Oncology

## 2016-12-13 ENCOUNTER — Encounter (HOSPITAL_COMMUNITY): Payer: Self-pay | Admitting: *Deleted

## 2016-12-17 ENCOUNTER — Encounter (HOSPITAL_COMMUNITY): Payer: Self-pay | Admitting: *Deleted

## 2016-12-17 ENCOUNTER — Ambulatory Visit: Payer: PPO | Admitting: Oncology

## 2016-12-19 ENCOUNTER — Ambulatory Visit (HOSPITAL_COMMUNITY): Payer: PPO | Admitting: Anesthesiology

## 2016-12-19 ENCOUNTER — Encounter (HOSPITAL_COMMUNITY)
Admission: RE | Disposition: A | Discharge status: Home / Self Care | Payer: Self-pay | Source: Ambulatory Visit | Attending: Gastroenterology

## 2016-12-19 ENCOUNTER — Ambulatory Visit (HOSPITAL_COMMUNITY)
Admission: RE | Admit: 2016-12-19 | Discharge: 2016-12-19 | Disposition: A | Discharge status: Home / Self Care | Payer: PPO | Source: Ambulatory Visit | Attending: Gastroenterology | Admitting: Gastroenterology

## 2016-12-19 ENCOUNTER — Encounter (HOSPITAL_COMMUNITY): Payer: Self-pay | Admitting: Gastroenterology

## 2016-12-19 DIAGNOSIS — I10 Essential (primary) hypertension: Secondary | ICD-10-CM | POA: Insufficient documentation

## 2016-12-19 DIAGNOSIS — K8689 Other specified diseases of pancreas: Secondary | ICD-10-CM

## 2016-12-19 DIAGNOSIS — K869 Disease of pancreas, unspecified: Secondary | ICD-10-CM | POA: Diagnosis not present

## 2016-12-19 DIAGNOSIS — Z96641 Presence of right artificial hip joint: Secondary | ICD-10-CM | POA: Insufficient documentation

## 2016-12-19 DIAGNOSIS — Z952 Presence of prosthetic heart valve: Secondary | ICD-10-CM | POA: Insufficient documentation

## 2016-12-19 DIAGNOSIS — R933 Abnormal findings on diagnostic imaging of other parts of digestive tract: Secondary | ICD-10-CM | POA: Diagnosis not present

## 2016-12-19 DIAGNOSIS — I499 Cardiac arrhythmia, unspecified: Secondary | ICD-10-CM | POA: Diagnosis not present

## 2016-12-19 DIAGNOSIS — I739 Peripheral vascular disease, unspecified: Secondary | ICD-10-CM | POA: Insufficient documentation

## 2016-12-19 DIAGNOSIS — R011 Cardiac murmur, unspecified: Secondary | ICD-10-CM | POA: Diagnosis not present

## 2016-12-19 DIAGNOSIS — Z950 Presence of cardiac pacemaker: Secondary | ICD-10-CM | POA: Insufficient documentation

## 2016-12-19 DIAGNOSIS — R8569 Abnormal cytological findings in specimens from other digestive organs and abdominal cavity: Secondary | ICD-10-CM | POA: Diagnosis not present

## 2016-12-19 DIAGNOSIS — R948 Abnormal results of function studies of other organs and systems: Secondary | ICD-10-CM

## 2016-12-19 DIAGNOSIS — K219 Gastro-esophageal reflux disease without esophagitis: Secondary | ICD-10-CM | POA: Insufficient documentation

## 2016-12-19 HISTORY — PX: EUS: SHX5427

## 2016-12-19 SURGERY — UPPER ENDOSCOPIC ULTRASOUND (EUS) RADIAL
Anesthesia: Monitor Anesthesia Care

## 2016-12-19 MED ORDER — ONDANSETRON HCL 4 MG/2ML IJ SOLN
INTRAMUSCULAR | Status: DC | PRN
  Administered 2016-12-19: 4 mg via INTRAVENOUS

## 2016-12-19 MED ORDER — PROPOFOL 10 MG/ML IV BOLUS
INTRAVENOUS | Status: DC | PRN
  Administered 2016-12-19: 40 mg via INTRAVENOUS

## 2016-12-19 MED ORDER — SODIUM CHLORIDE 0.9 % IV SOLN: INTRAVENOUS | Status: DC

## 2016-12-19 MED ORDER — PROPOFOL 10 MG/ML IV BOLUS
INTRAVENOUS | Status: AC
  Filled 2016-12-19: qty 60

## 2016-12-19 MED ORDER — LIDOCAINE 2% (20 MG/ML) 5 ML SYRINGE
INTRAMUSCULAR | Status: AC
  Filled 2016-12-19: qty 5

## 2016-12-19 MED ORDER — PHENYLEPHRINE 40 MCG/ML (10ML) SYRINGE FOR IV PUSH (FOR BLOOD PRESSURE SUPPORT)
PREFILLED_SYRINGE | INTRAVENOUS | Status: DC | PRN
  Administered 2016-12-19 (×4): 80 ug via INTRAVENOUS

## 2016-12-19 MED ORDER — PHENYLEPHRINE 40 MCG/ML (10ML) SYRINGE FOR IV PUSH (FOR BLOOD PRESSURE SUPPORT)
PREFILLED_SYRINGE | INTRAVENOUS | Status: AC
  Filled 2016-12-19: qty 10

## 2016-12-19 MED ORDER — LACTATED RINGERS IV SOLN
INTRAVENOUS | Status: DC
  Administered 2016-12-19: 1000 mL via INTRAVENOUS

## 2016-12-19 MED ORDER — ONDANSETRON HCL 4 MG/2ML IJ SOLN
INTRAMUSCULAR | Status: AC
  Filled 2016-12-19: qty 2

## 2016-12-19 MED ORDER — LIDOCAINE 2% (20 MG/ML) 5 ML SYRINGE
INTRAMUSCULAR | Status: DC | PRN
  Administered 2016-12-19: 100 mg via INTRAVENOUS

## 2016-12-19 MED ORDER — PROPOFOL 500 MG/50ML IV EMUL
INTRAVENOUS | Status: DC | PRN
  Administered 2016-12-19: 150 ug/kg/min via INTRAVENOUS

## 2016-12-19 NOTE — Anesthesia Postprocedure Evaluation (Signed)
Anesthesia Post Note  Patient: Nathaniel Richard  Procedure(s) Performed: Procedure(s) (LRB): UPPER ENDOSCOPIC ULTRASOUND (EUS) RADIAL (N/A) UPPER ENDOSCOPIC ULTRASOUND (EUS) LINEAR (N/A)  Patient location during evaluation: Endoscopy Anesthesia Type: MAC Level of consciousness: awake and alert Pain management: pain level controlled Vital Signs Assessment: post-procedure vital signs reviewed and stable Respiratory status: spontaneous breathing, nonlabored ventilation, respiratory function stable and patient connected to nasal cannula oxygen Cardiovascular status: stable and blood pressure returned to baseline Anesthetic complications: no       Last Vitals:  Vitals:   12/19/16 1250 12/19/16 1300  BP: (!) 99/56 103/82  Pulse: 65 65  Resp: 15 11  Temp:      Last Pain:  Vitals:   12/19/16 1219  TempSrc: Oral                 Catalina Gravel

## 2016-12-19 NOTE — Transfer of Care (Signed)
Immediate Anesthesia Transfer of Care Note  Patient: Nathaniel Richard  Procedure(s) Performed: Procedure(s): UPPER ENDOSCOPIC ULTRASOUND (EUS) RADIAL (N/A) UPPER ENDOSCOPIC ULTRASOUND (EUS) LINEAR (N/A)  Patient Location: PACU  Anesthesia Type:MAC  Level of Consciousness:  sedated, patient cooperative and responds to stimulation  Airway & Oxygen Therapy:Patient Spontanous Breathing and Patient connected to face mask oxgen  Post-op Assessment:  Report given to PACU RN and Post -op Vital signs reviewed and stable  Post vital signs:  Reviewed and stable  Last Vitals:  Vitals:   12/19/16 1032  BP: (!) 156/97  Pulse: 65  Resp: (!) 9  Temp: 97.4 C    Complications: No apparent anesthesia complications

## 2016-12-19 NOTE — H&P (Signed)
HPI: This is a 66 yo man  Chief complaint is incidental pancreatic mass, lesion on chest CT scan    ROS: complete GI ROS as described in HPI.  Constitutional:  No unintentional weight loss   Past Medical History:  Diagnosis Date  . Arthritis   . Hypertension   . Pacemaker     Past Surgical History:  Procedure Laterality Date  . AORTIC VALVE REPLACEMENT    . EYE SURGERY     bilat cataracts  . HEMORRHOID SURGERY    . INSERT / REPLACE / REMOVE PACEMAKER    . TOTAL HIP ARTHROPLASTY  07/29/2012   Procedure: TOTAL HIP ARTHROPLASTY;  Surgeon: Gearlean Alf, MD;  Location: [facility];  Service: Orthopedics;  Laterality: Right;    No current facility-administered medications for this encounter.     Allergies as of 12/05/2016 - Review Complete 11/18/2016  Allergen Reaction Noted  . Robaxin [methocarbamol] Other (See Comments) 11/09/2015    Family History  Problem Relation Age of Onset  . Diabetes Mother   . Hypertension Mother   . Dementia Mother   . Heart failure Father   . Hypertension Father     Social History   Social History  . Marital status: Married    Spouse name: N/A  . Number of children: 2  . Years of education: N/A   Occupational History  . Not on file.   Social History Main Topics  . Smoking status: Never Smoker  . Smokeless tobacco: Never Used  . Alcohol use No  . Drug use: No  . Sexual activity: Not on file   Other Topics Concern  . Not on file   Social History Narrative  . No narrative on file     Physical Exam: There were no vitals taken for this visit. Constitutional: generally well-appearing Psychiatric: alert and oriented x3 Abdomen: soft, nontender, nondistended, no obvious ascites, no peritoneal signs, normal bowel sounds No peripheral edema noted in lower extremities  Assessment and plan: 66 y.o. male with abnormal pancreas  Mass vs. Vascular abnormality?  Could not get MRI due to pacemaker  For upper EUS toda   Owens Loffler, MD [facility] 12/19/2016, 9:57 AM

## 2016-12-19 NOTE — Anesthesia Preprocedure Evaluation (Addendum)
Anesthesia Evaluation  Patient identified by MRN, date of birth, ID band Patient awake    Reviewed: Allergy & Precautions, NPO status , Patient's Chart, lab work & pertinent test results  Airway Mallampati: II  TM Distance: >3 FB Neck ROM: Full    Dental  (+) Teeth Intact, Dental Advisory Given, Chipped,    Pulmonary neg pulmonary ROS,    Pulmonary exam normal breath sounds clear to auscultation       Cardiovascular hypertension, Pt. on medications + Peripheral Vascular Disease  Normal cardiovascular exam+ dysrhythmias + pacemaker  Rhythm:Regular Rate:Normal  S/p AVR  Last echocardiogram was in August 2016. He had normal LV function with moderate left ventricular hypertrophy. There was a bioprosthetic aortic valve with trace aortic insufficiency. There is mild tricuspid regurgitation. CTA December 2016 showed ectasia of the descending thoracic aorta at 3.8 cm.   Neuro/Psych negative neurological ROS  negative psych ROS   GI/Hepatic Neg liver ROS, GERD  Medicated,Pancreatic mass    Endo/Other  negative endocrine ROS  Renal/GU negative Renal ROS     Musculoskeletal  (+) Arthritis , Osteoarthritis,    Abdominal   Peds  Hematology negative hematology ROS (+)   Anesthesia Other Findings Day of surgery medications reviewed with the patient.  Reproductive/Obstetrics                            Anesthesia Physical Anesthesia Plan  ASA: III  Anesthesia Plan: MAC   Post-op Pain Management:    Induction: Intravenous  Airway Management Planned: Nasal Cannula  Additional Equipment:   Intra-op Plan:   Post-operative Plan:   Informed Consent: I have reviewed the patients History and Physical, chart, labs and discussed the procedure including the risks, benefits and alternatives for the proposed anesthesia with the patient or authorized representative who has indicated his/her understanding  and acceptance.   Dental advisory given  Plan Discussed with: CRNA and Anesthesiologist  Anesthesia Plan Comments: (Discussed risks/benefits/alternatives to MAC sedation including need for ventilatory support, hypotension, need for conversion to general anesthesia.  All patient questions answered.  Patient/guardian wishes to proceed.)        Anesthesia Quick Evaluation

## 2016-12-19 NOTE — Op Note (Signed)
[facility] Patient Name: Nathaniel Richard Procedure Date: 12/19/2016 MRN: [id number] Attending MD: Milus Banister , MD Date of Birth: 03-Apr-1951 CSN: [id number] Age: 65 Admit Type: Outpatient Procedure:                Upper EUS Indications:              Incidentally discovered pancreatic mass (during                            cardiac testing); no previous pancreatic disease,                            no FH of pancreatic cancer, non-drinker, no weight                            loss Providers:                Milus Banister, MD, Jobe Igo, RN, Alfonso Patten, Technician, Marla Roe, CRNA Referring MD:              Medicines:                Monitored Anesthesia Care Complications:            No immediate complications. Estimated blood loss:                            None. Estimated Blood Loss:     Estimated blood loss: none. Procedure:                Pre-Anesthesia Assessment:                           - Prior to the procedure, a History and Physical                            was performed, and patient medications and                            allergies were reviewed. The patient's tolerance of                            previous anesthesia was also reviewed. The risks                            and benefits of the procedure and the sedation                            options and risks were discussed with the patient.                            All questions were answered, and informed consent                            was obtained. Prior Anticoagulants:  The patient has                            taken no previous anticoagulant or antiplatelet                            agents. ASA Grade Assessment: II - A patient with                            mild systemic disease. After reviewing the risks                            and benefits, the patient was deemed in                            satisfactory condition to undergo the  procedure.                           After obtaining informed consent, the endoscope was                            passed under direct vision. Throughout the                            procedure, the patient's blood pressure, pulse, and                            oxygen saturations were monitored continuously. The                            [id number]) scope was introduced through                            the mouth, and advanced to the second part of                            duodenum. The [id number]) scope was                            introduced through the mouth, and advanced to the                            second part of duodenum. The upper EUS was                            technically difficult and complex. The patient                            tolerated the procedure well. Scope In: Scope Out: Findings:      Endoscopic Finding :      The examined esophagus was endoscopically normal.      The entire examined stomach was endoscopically normal.      The examined duodenum was endoscopically normal.      Endosonographic Finding :  1. A round, solid mass was identified in the pancreatic neck. The mass       was hypoechoic, heterogeneous. The mass measured 13 mm in maximal       cross-sectional diameter. The endosonographic borders were well-defined.       The mass directly abuts the confluence of the SMV/portal vein but there       appears to be thin rim of normal tissue between the mass and the vessel.       The remainder of the pancreas was examined. The endosonographic       appearance of parenchyma and the upstream pancreatic duct indicated that       the remainder of the pancreas was unremarkable. Fine needle aspiration       for cytology was performed. Color Doppler imaging was utilized prior to       needle puncture to confirm a lack of significant vascular structures       within the needle path. Two passes were made with the 25 gauge needle        using a transgastric approach. A stylet was used. A cytotechnologist was       present to evaluate the adequacy of the specimen. Final cytology results       are pending.      2. Main pancreatic duct was normal.      3. CBD was normal      4. Gallbladder was normal. Impression:               - 1.3cm mass in the neck of pancreas, directly                            abutting the SMV/PV confluence but with thin rim of                            tissue between the mass and vessels. FNA performed. Moderate Sedation:      N/A- Per Anesthesia Care Recommendation:           - Discharge patient to home (ambulatory).                           - Await cytology results. Procedure Code(s):        --- Professional ---                           [id number], Esophagogastroduodenoscopy, flexible,                            transoral; with transendoscopic ultrasound-guided                            intramural or transmural fine needle                            aspiration/biopsy(s), (includes endoscopic                            ultrasound examination limited to the esophagus,                            stomach or  duodenum, and adjacent structures) Diagnosis Code(s):        --- Professional ---                           K86.89, Other specified diseases of pancreas                           R93.3, Abnormal findings on diagnostic imaging of                            other parts of digestive tract CPT copyright 2016 American Medical Association. All rights reserved. The codes documented in this report are preliminary and upon coder review may  be revised to meet current compliance requirements. Milus Banister, MD 12/19/2016 12:27:51 PM This report has been signed electronically. Number of Addenda: 0

## 2016-12-23 ENCOUNTER — Encounter (HOSPITAL_COMMUNITY): Payer: Self-pay | Admitting: Gastroenterology

## 2016-12-24 ENCOUNTER — Telehealth: Payer: Self-pay | Admitting: Gastroenterology

## 2016-12-24 ENCOUNTER — Encounter: Payer: Self-pay | Admitting: *Deleted

## 2016-12-24 ENCOUNTER — Ambulatory Visit (HOSPITAL_BASED_OUTPATIENT_CLINIC_OR_DEPARTMENT_OTHER): Payer: PPO | Admitting: Oncology

## 2016-12-24 VITALS — BP 146/95 | HR 64 | Temp 98.4°F | Resp 18 | Ht 69.0 in | Wt 185.6 lb

## 2016-12-24 DIAGNOSIS — Q453 Other congenital malformations of pancreas and pancreatic duct: Secondary | ICD-10-CM

## 2016-12-24 DIAGNOSIS — K869 Disease of pancreas, unspecified: Secondary | ICD-10-CM

## 2016-12-24 DIAGNOSIS — K8689 Other specified diseases of pancreas: Secondary | ICD-10-CM

## 2016-12-24 NOTE — Progress Notes (Signed)
Oncology Nurse Navigator Documentation  Oncology Nurse Navigator Flowsheets 12/24/2016  Navigator Location [facility]  Referral date to RadOnc/MedOnc 11/27/2017  Navigator Encounter Type Initial MedOnc  Telephone -  Abnormal Finding Date 11/19/2017  Patient Visit Type MedOnc;Initial  Treatment Phase Abnormal Scans  Barriers/Navigation Needs Coordination of Care;Education  Education Newly Diagnosed Cancer Education  Interventions Coordination of Care  Coordination of Care Other--added to GI Conference discussion on 01/01/17 and message to [facility] to confirm Dr. Barry Dienes will see him in clinic on 01/03/17.  Support Groups/Services GI Support Group  Acuity Level 2  Time Spent with Patient 10  Met with patient and wife, Jeani Hawking during new patient visit. Explained the role of the GI Nurse Navigator and provided New Patient Packet with information on: 1. Islet Cell cancer--anatomy of pancreas 2. Support groups 3. Advanced Directives 4. Fall Safety Plan Answered questions, reviewed current treatment plan using TEACH back and provided emotional support. Provided copy of current treatment plan. Mr. 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 He goes to [facility] daily and enjoys golf. Has had no complications from his aortic valve replacement in 2014. Has a pacemaker for over 10 years as well. Informed him that navigator will contact him with the exact time he sees Dr. Barry Dienes on 01/03/17 after [facility] responds to message.   Merceda Elks, RN, BSN GI Oncology [place]

## 2016-12-24 NOTE — Progress Notes (Signed)
Nathaniel Richard Consult   Referring MD: Pantaleon Vignali 66 y.o.  [birth year]    Reason for Referral: Pancreas mass   HPI: Mr. Hoggatt is followed by Dr. Stanford Breed for cardiac disease. He was referred for a surveillance chest CT 11/18/2016 for follow-up of a thoracic aortic aneurysm. A 4.2 cm ascending thoracic aortic aneurysm was noted. A 13 mm peripherally enhancing abnormality was noted in the pancreas at the junction of the head and body. This appeared slightly enlarged compared to a CT from 2016. No other abnormality in the upper abdomen. He was referred for a CT of the abdomen 11/26/2016. No focal liver abdomen only. A 1.1 cm enhancing lesion was noted in the posterior aspect of the pancreas head with peripheral rim enhancement on arterial and portal venous phase imaging. The lesion appears contiguous with the portal splenic confluence. The lesion is felt to most likely represent an islet cell tumor. No lymphadenopathy.  He was referred to Dr. Ardis Hughs and was taken to an endoscopic ultrasound procedure on 12/19/2016. The esophagus, stomach, and duodenum appeared normal. A solid mass was identified in the pancreas neck measuring 13 mm. The mass was noted to abut the confluence of the SMV/portal vein with a thin rim of normal tissue between the mass and vessel. The remainder the pancreas appeared unremarkable. An FNA biopsy was performed.Marland Kitchen He the cytology [id number]) revealed atypical cells.  Mr. Schmidtke reports feeling well.  Past Medical History:  Diagnosis Date  . Arthritis   . Hypertension   . Pacemaker     .  Allergies  Past Surgical History:  Procedure Laterality Date  . AORTIC VALVE REPLACEMENT    . EUS N/A 12/19/2016   Procedure: UPPER ENDOSCOPIC ULTRASOUND (EUS) RADIAL;  Surgeon: Milus Banister, MD;  Location: [facility];  Service: Endoscopy;  Laterality: N/A;  . EUS N/A 12/19/2016   Procedure: UPPER ENDOSCOPIC ULTRASOUND (EUS) LINEAR;  Surgeon:  Milus Banister, MD;  Location: [facility];  Service: Endoscopy;  Laterality: N/A;  . EYE SURGERY     bilat cataracts  . HEMORRHOID SURGERY    . INSERT / REPLACE / REMOVE PACEMAKER    . TOTAL HIP ARTHROPLASTY  07/29/2012   Procedure: TOTAL HIP ARTHROPLASTY;  Surgeon: Gearlean Alf, MD;  Location: [facility];  Service: Orthopedics;  Laterality: Right;    Medications: Reviewed  Allergies:  Allergies  Allergen Reactions  . Robaxin [Methocarbamol] Other (See Comments)    Psychotic episodes.    Family history: No family history of cancer.  Social History:   He lives with his wife in [place]. 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 He does not use cigarettes or alcohol. No transfusion history. No risk factor for HIV or hepatitis.    ROS:   Positives include: Reflux symptoms at night relieved with Zantac, erectile dysfunction since undergoing aortic valve surgery  A complete ROS was otherwise negative.  Physical Exam:  Blood pressure (!) 146/95, pulse 64, temperature 98.4 F (36.9 C), temperature source Oral, resp. rate 18, height 5\' 9"  (1.753 m), weight 185 lb 9.6 oz (84.2 kg), SpO2 100 %.  HEENT: Oropharynx without visible mass, neck without mass Lungs: Clear bilaterally Cardiac: Regular rate and rhythm Abdomen: No hepatosplenomegaly, no mass, nontender GU: Testes without mass  Vascular: No leg edema Lymph nodes: No cervical, supraclavicular, axillary, or inguinal nodes Neurologic: Alert and oriented, the motor exam appears intact in the upper and lower extremities Skin: No rash, hyperpigmentation in  a suntan exposure distribution Musculoskeletal: No spine tenderness   LAB:   Imaging:  As per history of present illness, images not available for review   Assessment/Plan:   1. Enhancing pancreas neck mass  1.1 cm enhancing lesion identified in the posterior pancreas head on a CT 11/26/2016, no evidence of metastatic disease  EUS 12/19/2016 revealed a 1.3 cm pancreas neck  mass with abutment of the confluence of the SMV/portal vein, FNA biopsy revealed atypical cells  2. Status post aortic valve replacement in 2014  3.   Status post right total hip replacement in 2013  4.   Hypertension  5.  Thoracic aortic aneurysm   Disposition:   Mr. Royal was incidentally found to have a pancreas head mass on a chest CT in December 2017. An enhancing mass was confirmed on a CT abdomen/pelvis and EUS examination. No evidence for distant metastatic disease. A biopsy revealed "atypical" cells.  Mr. Muhlenkamp most likely has a pancreatic neuroendocrine tumor. He does not have symptoms to suggest a functioning islet cell tumor. There is no evidence of metastatic disease.  We will refer him to Dr. Barry Dienes to consider additional diagnostic/surgical options. His case will be presented at the GI tumor conference 01/01/2017.  He is not scheduled for a follow-up appointment in medical oncology. We will see him as needed based on the evaluation by Dr. Barry Dienes.  Approximately 50 minutes were spent with the Richard today. The majority of the time was used for counseling and coordination of care.  Betsy Coder, MD  12/24/2016, 2:34 PM

## 2016-12-24 NOTE — Patient Instructions (Signed)
Care Plan Summary- 12/24/2016 Name: Nathaniel Richard         DOB: October 21, 1951 Your Medical Team:  Medical Oncologist:  Dr. Ma Rings Radiation Oncologist:   Surgeon:    Type of Cancer: Pancreatic Lesion (head)-likely Islet cell tumor  Stage/Grade: Undetermined *Exact staging of your cancer is based on size of the tumor, depth of invasion, involvement of lymph nodes or not, and whether or not the cancer has spread beyond the primary site   Recommendations: Based on information available as of today's consult. Recommendations may change depending on the results of further tests or exams. 1) Surgery is the only cure for islet cell tumor of pancreas-Dr. Stark Klein 2)  3)   Next Steps: 1) Will present case at 01/01/17 tumor board for discussion 2)  Will schedule to see Dr. Barry Dienes here in GI Clinic on 01/03/17 3)      Questions? Merceda Elks, RN, BSN at [phone]. [name] is your Oncology Nurse Navigator and is available to assist you while you're receiving your medical care at [facility]

## 2016-12-24 NOTE — Telephone Encounter (Signed)
Patient states he is returning phone call to Dr.Jacobs and his best call back number is [phone]. Pt states he does have an appointment at 2:00pm today with Oncology.

## 2016-12-25 NOTE — Telephone Encounter (Signed)
See results note from yesterday.  Patty, I think I might have forgotten to send to you; he needs referral to Dr. Barry Dienes for pancreatic mass, consider whipple.  Thanks

## 2016-12-25 NOTE — Telephone Encounter (Signed)
Referral form faxed to [facility]

## 2016-12-27 ENCOUNTER — Telehealth: Payer: Self-pay | Admitting: *Deleted

## 2016-12-27 NOTE — Telephone Encounter (Signed)
Called patient and confirmed he will see Dr. Barry Dienes in GI [place] on 01/03/17 with arrival at 0915/MD at 0930. He agrees as well to see CSW and dietician. Wishes to hold off on PT evaluation unless Dr. Barry Dienes strongly feels he needs it.

## 2017-01-03 ENCOUNTER — Ambulatory Visit: Payer: PPO | Attending: Oncology | Admitting: Physical Therapy

## 2017-01-03 ENCOUNTER — Other Ambulatory Visit: Payer: Self-pay | Admitting: General Surgery

## 2017-01-03 ENCOUNTER — Ambulatory Visit: Payer: PPO | Admitting: Nutrition

## 2017-01-03 ENCOUNTER — Other Ambulatory Visit: Payer: Self-pay | Admitting: *Deleted

## 2017-01-03 ENCOUNTER — Ambulatory Visit (HOSPITAL_BASED_OUTPATIENT_CLINIC_OR_DEPARTMENT_OTHER): Payer: PPO

## 2017-01-03 ENCOUNTER — Encounter: Payer: Self-pay | Admitting: *Deleted

## 2017-01-03 ENCOUNTER — Encounter: Payer: Self-pay | Admitting: Physical Therapy

## 2017-01-03 DIAGNOSIS — M6281 Muscle weakness (generalized): Secondary | ICD-10-CM

## 2017-01-03 DIAGNOSIS — K8689 Other specified diseases of pancreas: Secondary | ICD-10-CM

## 2017-01-03 DIAGNOSIS — K869 Disease of pancreas, unspecified: Secondary | ICD-10-CM | POA: Diagnosis not present

## 2017-01-03 NOTE — Progress Notes (Signed)
Patient was seen in GI clinic.  66 year old male diagnosed with a pancreatic mass/neuroendocrine tumor.  Past medical history includes arthritis, hypertension, pacemaker and aortic valve replacement.  Medications include Zantac.  Labs were reviewed.  Height: 5 feet 9 inches. Weight: 185 pounds. Usual body weight: 185 pounds. BMI: 27.41.  Patient and wife interested in nutrition interventions for upcoming surgery. Patient denies nutrition impact symptoms currently. He eats a normal diet and exercises 3-4 days weekly.  Nutrition diagnosis:  Food and nutrition related knowledge deficit related to pancreatic mass as evidenced by no prior need for nutrition related information.  Intervention: Patient and wife were educated on the importance of healthy plant-based diet with adequate protein prior to surgery. Encouraged continued exercise as tolerated and allowed by physician. Reviewed purchasing Impact AR and consuming 3 bottles 5 days prior to surgery. Educated patient and wife on the importance of preop nutrition. Questions were answered.  Teach back method used.  Contact information provided.  Monitoring, evaluation, goals:  Patient will tolerate adequate calories and protein prior and after surgery.  Next visit: To be scheduled as needed.  **Disclaimer: This note was dictated with voice recognition software. Similar sounding words can inadvertently be transcribed and this note may contain transcription errors which may not have been corrected upon publication of note.**

## 2017-01-03 NOTE — Therapy (Signed)
[facility] [address], [place], [place], [address] Phone: [phone]   Fax:  [phone]  Physical Therapy Evaluation  Patient Details  Name: Nathaniel Richard MRN: [id number] Date of Birth: Apr 09, 1951 Referring Provider: Charise Carwin  Encounter Date: 01/03/2017      PT End of Session - 01/03/17 1139    Visit Number 1   PT Start Time [id number]   PT Stop Time 1129   PT Time Calculation (min) 24 min   Activity Tolerance Patient tolerated treatment well   Behavior During Therapy [facility] for tasks assessed/performed      Past Medical History:  Diagnosis Date  . Arthritis   . Hypertension   . Pacemaker     Past Surgical History:  Procedure Laterality Date  . AORTIC VALVE REPLACEMENT    . EUS N/A 12/19/2016   Procedure: UPPER ENDOSCOPIC ULTRASOUND (EUS) RADIAL;  Surgeon: Milus Banister, MD;  Location: [facility];  Service: Endoscopy;  Laterality: N/A;  . EUS N/A 12/19/2016   Procedure: UPPER ENDOSCOPIC ULTRASOUND (EUS) LINEAR;  Surgeon: Milus Banister, MD;  Location: [facility];  Service: Endoscopy;  Laterality: N/A;  . EYE SURGERY     bilat cataracts  . HEMORRHOID SURGERY    . INSERT / REPLACE / REMOVE PACEMAKER    . TOTAL HIP ARTHROPLASTY  07/29/2012   Procedure: TOTAL HIP ARTHROPLASTY;  Surgeon: Gearlean Alf, MD;  Location: [facility];  Service: Orthopedics;  Laterality: Right;    There were no vitals filed for this visit.       Subjective Assessment - 01/03/17 1132    Subjective Patient is attending GI Clinic   Patient is accompained by: Family member  wife   Patient Stated Goals education   Currently in Pain? No/denies   Multiple Pain Sites No            [facility] PT Assessment - 01/03/17 0001      Assessment   Medical Diagnosis Islet Cell Tumor   Referring Provider Br. Byerly   Onset Date/Surgical Date 12/19/16   Prior Therapy none     Precautions   Precautions ICD/Pacemaker;Other (comment)   Precaution Comments Cancer     Restrictions   Weight Bearing Restrictions No     Balance Screen   Has the patient fallen in the past 6 months No   Has the patient had a decrease in activity level because of a fear of falling?  No   Is the patient reluctant to leave their home because of a fear of falling?  No     Home Ecologist residence   Available Help at Discharge Friend(s)     Prior Function   Level of Independence Independent   Vocation Full time employment   Vocation Requirements sitting, lifting   Leisure exercise 3-4 times at [employer], [employer] 30-45 min, weights, golfing     Cognition   Overall Cognitive Status Within Functional Limits for tasks assessed     Observation/Other Assessments   Focus on Therapeutic Outcomes (FOTO)  0% limitation at this time     Posture/Postural Control   Posture/Postural Control No significant limitations     ROM / Strength   AROM / PROM / Strength AROM;Strength     AROM   Overall AROM  Within functional limits for tasks performed     Strength   Overall Strength Within functional limits for tasks performed     Transfers  Transfers Not assessed     Ambulation/Gait   Ambulation/Gait No     Balance   Balance Assessed --  single leg stance 5 sec with increased trunk sway                           PT Education - 01/03/17 1138    Education provided Yes   Education Details scar massage; tips to conserve energy; walking program with gym program; balance exercises; coughing with pillow   Person(s) Educated Patient;Spouse   Methods Explanation;Demonstration;Verbal cues;Handout   Comprehension Returned demonstration;Verbalized understanding             PT Long Term Goals - 01/03/17 1147      PT LONG TERM GOAL #1   Title understand how to perform scar massage to improve mobility of surgical site when healed   Time 1   Period Days   Status Achieved     PT LONG TERM GOAL  #2   Title understand importance of walking program and how to return to exercise after surgery safely   Time 1   Period Days   Status Achieved     PT LONG TERM GOAL #3   Title understand how to conserve energy after surgery   Time 1   Period Days   Status Achieved     PT LONG TERM GOAL #4   Title understand how to cough or sneeze in a  pillow to protect the surgical site   Time 1   Period Days   Status Achieved               Plan - 01/03/17 1140    Clinical Impression Statement Patient is a 66 year old male attending GI clinic with his wife to meet with physicians about course of his treatment. Patient diagnosis id Islet Cell Tumor per EUS on 12/19/2016.  Patient will be having surgery by Dr. Barry Dienes.  Patient reports no pain.  Patient presently exercises at the gym 3-4 times per week.  Patient has full trunk ROM and within normal strength of extremities.  Patient able to stand on one leg for 5 seconds with increased trunk sway. Patient is low complexity with stable condition and comorbidity including pacemaker that can impact his care.  Patient benefitted from physical therapy to be educated on scar massage, conserving energy after surgery, how to return to exercise after surgery, and walking program.    Rehab Potential Excellent   Clinical Impairments Affecting Rehab Potential Pacemaker   PT Frequency One time visit   PT Duration Other (comment)  1 time vsit in GI clinic   PT Treatment/Interventions Therapeutic exercise;Therapeutic activities;Balance training;Neuromuscular re-education;Patient/family education;Scar mobilization   PT Next Visit Plan Discharge to HEP   PT Home Exercise Plan current HEP   Recommended Other Services After surgery to have physical therapy to return to the gym   Consulted and Agree with Plan of Care Patient;Family member/caregiver   Family Member Consulted wife      Patient will benefit from skilled therapeutic intervention in order to improve  the following deficits and impairments:  Decreased balance  Visit Diagnosis: Muscle weakness (generalized) - Plan: PT plan of care cert/re-cert      G-Codes - [id number] 1149    Functional Assessment Tool Used therapist discretion 0% limitation at this time   Functional Limitation Other PT primary   Other PT Primary Current Status [id number]) 0 percent impaired, limited or restricted  Other PT Primary Goal Status [id number]) 0 percent impaired, limited or restricted   Other PT Primary Discharge Status [phone]) 0 percent impaired, limited or restricted       Problem List Patient Active Problem List   Diagnosis Date Noted  . Mass of pancreas   . H/O aortic valve replacement 11/09/2015  . Thoracic aortic aneurysm ([place]) 11/09/2015  . Erectile dysfunction 11/09/2015  . Hypertension   . Dysrhythmia   . Pacemaker   . Heart murmur   . Arthritis   . OA (osteoarthritis) of hip 07/29/2012    Earlie Counts, PT 01/03/17 11:52 AM   [facility] [address] [place], [place], [address] Phone: [phone]   Fax:  [phone]  Name: RICHI IRONS MRN: [id number] Date of Birth: 05-20-1951

## 2017-01-03 NOTE — Patient Instructions (Signed)
Tandem Stance    Can hold onto counter if not feeling steady. Right foot in front of left, heel touching toe both feet "straight ahead". Stand on Foot Triangle of Support with both feet. Balance in this position _30__ seconds. Do with left foot in front of right. 3 times each  Copyright  VHI. All rights reserved.    Stance: single leg on floor. Raise leg. Hold _15__ seconds. Repeat with other leg. __3_ reps per set, _1__ sets per day .  Can hold onto counter to keep steady  Copyright  VHI. All rights reserved.     Try these techniques to guard against pain after abdominal surgery: . Squeeze a pillow to your chest when coughing. . To get out of bed, squeeze a pillow to your chest and roll onto your side first.  Then, push up on your elbow to sit up while still hugging the pillow with the other arm. . To move to the edge of the bed, continue to hug the pillow while you scoot your hips to the edge of the bed. . If you must use your hands to assist with standing up, place your hands on your knees, lean forward, and stand up using your legs primarily.  Place as little weight on your arms as possible. Nathaniel Richard, [facility] [place], [place], [place] [address], [phone]    Tips for Energy Conservation for Activities of Daily Living . Plan ahead to avoid rushing. . Sit down to bathe and dry off. Wear a terry robe instead of drying off. . Use a shower/bath organizer to decrease leaning and reaching. . Use extension handles on sponges and brushes. Susa Simmonds grab rails in the bathroom or use an elevated toilet seat. Hoyle Barr out clothes and toiletries before dressing. . Minimize leaning over to put on clothes and shoes. Bring your foot to your knee to apply socks and shoes. . Wear comfortable shoes and low-heeled, slip on shoes. Wear button front shirts rather than pullovers. Housekeeping . Schedule household tasks throughout  the week. . Do housework sitting down when possible. . Delegate heavy housework, shopping, laundry and child care when possible. . Drag or slide objects rather than lifting. . Sit when ironing and take rest periods. . Stop working before becoming overly tired. Shopping . Organize list by aisle. . Use a grocery cart for support. Marland Kitchen Shop at less busy times. . Ask for help with getting to the car. Meal Preparation . Use convenience and easy-to-prepare foods. . Use small appliances that take less effort to use. Marland Kitchen Prepare meals sitting down. . Soak dishes instead of scrubbing and let dishes air dry. . Prepare double portions and freeze half. Child Care . Plan activities that can be done sitting down, such as drawing pictures, playing games, reading, and computer games. . Encourage children to climb up onto your lap or into the highchair instead of being lifted. . Make a game of the household chores so that children will want to help. . Delegate child care when possible.  Nathaniel Richard, PT, [facility]; [place], [place], [place] [address]   Ways to get started on an exercise program 1.  Start for 10 minutes per day with a walking program. 2. Work towards 30 minutes of exercise per day 3. When you do an aerobic exercise program start on a low level 4. Water aerobics is a good  place due to decreased strain on your joints 5. Begin your exercise program gradually and progress slowly over time 6. When exercising use correct form. a. Keep neutral spine b. Engage abdominals c. Keep chest up  d. Chin down e. Do not lock your knees  Nathaniel Richard, PT [facility] [place], [place], [place] [address] [phone]  Scar Massage  Scar massage is done to improve the mobility of scar, decrease scar tissue from building up, reduce adhesions, and prevent Keloids from forming. Start scar massage after scabs  have fallen off by themselves and no open areas. The first few weeks after surgery, it is normal for a scar to appear pink or red and slightly raised. Scars can itch or have areas of numbness. Some scars may be sensitive.   Direct Scar massage: after scar is healed, no opening, no scab 1.  Place pads of two fingers together directly on the scar starting at one end of the scar. Move the fingers up and down across the scar holding 5 seconds one direction.  Then go opposite direction hold 5 seconds.  2. Move over to the next section of the scar and repeat.  Work your way along the entire length of the scar.   3. Next make diagonal movements along the scar holding 5 seconds at one direction. 4. Next movement is side to side. 5. Do not rub fingers over the scar.  Instead keep firm pressure and move scar over the tissue it is on top   Scar Lift and Roll 12 weeks after surgery. 1. Pinch a small amount of the scar between your first two fingers and thumb.  2. Roll the scar between your fingers for 5 to 15 seconds. 3. Move along the scar and repeat until you have massaged the entire length of scar.   Stop the massage and call your doctor if you notice: 1. Increased redness 2. Bleeding from scar 3. Seepage coming from the scar 4. Scar is warmer and has increased pain   [facility] [address], [place], [place] [address] Phone # [phone] Fax [phone]

## 2017-01-03 NOTE — Progress Notes (Signed)
Oncology Nurse Navigator Documentation  Oncology Nurse Navigator Flowsheets 01/03/2017  Navigator Location [facility]  Referral date to RadOnc/MedOnc -  Navigator Encounter Type Clinic/MDC  Telephone -  Abnormal Finding Date -  Multidisiplinary Clinic Date 01/03/2017  Patient Visit Type Surgery  Treatment Phase Abnormal Scans  Barriers/Navigation Needs Coordination of Care  Education Preparing for Upcoming Surgery/ Treatment  Interventions Coordination of Care--Chromogranin Lab today  Coordination of Care Appts--scheduled lab appointment for day and escorted patient to registration  Support Groups/Services -  Acuity Level 1  Time Spent with Patient 58  Was seen by Dr. Barry Dienes today and planning on surgery. Was seen by dietician and physical therapy to prepare for surgery. Gave patient the printed information from Dr. Barry Dienes.

## 2017-01-06 ENCOUNTER — Telehealth: Payer: Self-pay | Admitting: *Deleted

## 2017-01-06 LAB — CHROMOGRANIN A: Chromogranin A: 2 nmol/L (ref 0–5)

## 2017-01-06 NOTE — Telephone Encounter (Signed)
Patient advised of the results from his chromogranin. Pt has elected to go with surgical intervention and has been in touch with Dr Barry Dienes.

## 2017-01-06 NOTE — Telephone Encounter (Signed)
-----   Message from Ladell Pier, MD sent at 01/06/2017  3:26 PM EST ----- Please call patient, chromogranin is normal, f/u with Dr. Barry Dienes, f/u here prn

## 2017-01-08 NOTE — Progress Notes (Signed)
HPI: FU AVR. Patient had a pacemaker placed 8 years ago in [facility] by report. Had aortic valve replacement in December 2014 at [facility]. Previously followed in [facility]. Last echocardiogram was in August 2016. He had normal LV function with moderate left ventricular hypertrophy. There was a bioprosthetic aortic valve with trace aortic insufficiency. There is mild tricuspid regurgitation. CTA December 2017 showed 4.2 cm thoracic aortic aneurysm unchanged from previous. There was a 13 mm abnormality in the pancreas and MRI recommended. Now followed by oncology. Since last seen, patient denies dyspnea, chest pain, palpitations or syncope. He has had occasional double vision for approximately one year. This last for approximately 1 minute and resolves. No other associated neurological symptoms.  Current Outpatient Prescriptions  Medication Sig Dispense Refill  . amLODipine (NORVASC) 5 MG tablet Take 1 tablet (5 mg total) by mouth daily. 180 tablet 3  . aspirin 81 MG tablet Take 81 mg by mouth daily.    . cetirizine (ZYRTEC) 10 MG tablet Take 10 mg by mouth daily as needed for allergies or rhinitis.     . fluticasone (FLONASE) 50 MCG/ACT nasal spray Place 1 spray into both nostrils daily as needed for allergies or rhinitis.    . ranitidine (ZANTAC) 150 MG tablet Take 150 mg by mouth at bedtime.      No current facility-administered medications for this visit.      Past Medical History:  Diagnosis Date  . Arthritis   . Hypertension   . Pacemaker     Past Surgical History:  Procedure Laterality Date  . AORTIC VALVE REPLACEMENT    . EUS N/A 12/19/2016   Procedure: UPPER ENDOSCOPIC ULTRASOUND (EUS) RADIAL;  Surgeon: Milus Banister, MD;  Location: [facility];  Service: Endoscopy;  Laterality: N/A;  . EUS N/A 12/19/2016   Procedure: UPPER ENDOSCOPIC ULTRASOUND (EUS) LINEAR;  Surgeon: Milus Banister, MD;  Location: [facility];  Service: Endoscopy;  Laterality: N/A;  . EYE  SURGERY     bilat cataracts  . HEMORRHOID SURGERY    . INSERT / REPLACE / REMOVE PACEMAKER    . TOTAL HIP ARTHROPLASTY  07/29/2012   Procedure: TOTAL HIP ARTHROPLASTY;  Surgeon: Gearlean Alf, MD;  Location: [facility];  Service: Orthopedics;  Laterality: Right;    Social History   Social History  . Marital status: Married    Spouse name: N/A  . Number of children: 2  . Years of education: N/A   Occupational History  . Not on file.   Social History Main Topics  . Smoking status: Never Smoker  . Smokeless tobacco: Never Used  . Alcohol use No  . Drug use: No  . Sexual activity: Not on file   Other Topics Concern  . 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 Diabetes Mother   . Hypertension Mother   . Dementia Mother   . Heart failure Father   . Hypertension Father     ROS: no fevers or chills, productive cough, hemoptysis, dysphasia, odynophagia, melena, hematochezia, dysuria, hematuria, rash, seizure activity, orthopnea, PND, pedal edema, claudication. Remaining systems are negative.  Physical Exam: Well-developed well-nourished in no acute distress.  Skin is warm and dry.  HEENT is normal.  Neck is supple. Left  carotid bruit  Chest is clear to auscultation with normal expansion.  Cardiovascular exam is regular rate and rhythm. 2/6 systolic murmur left sternal border. No diastolic murmur. Abdominal exam nontender or distended. No masses palpated. Extremities show no edema. neuro grossly intact  ECG-Ventricular pacing.  A/P  1 Thoracic aortic aneurysm-plan follow-up CTA December 2018.   2 hypertension-blood pressure controlled. Continue present medications.  3 status post aortic valve replacement-continue SBE prophylaxis. Patient has had occasional double vision for 1 minute without associated  neurologic symptoms. I think this is unlikely to be embolic from his heart as this would imply that he has recurrent emboli to the same place every time. However I think it is worth checking an echocardiogram and carotid Dopplers.  4 prior pacemaker-followed by electrophysiology.  5 pancreatic mass-now followed by oncology. He apparently will require resection. He may proceed without further cardiac evaluation if echocardiogram above is unchanged. He is not having any cardiac symptoms and has no history of coronary disease.  Kirk Ruths, MD

## 2017-01-10 ENCOUNTER — Ambulatory Visit: Payer: PPO | Admitting: Cardiology

## 2017-01-13 ENCOUNTER — Encounter: Payer: PPO | Admitting: *Deleted

## 2017-01-13 ENCOUNTER — Telehealth: Payer: Self-pay | Admitting: Cardiology

## 2017-01-13 NOTE — Telephone Encounter (Signed)
LMOVM reminding pt to send remote transmission.   

## 2017-01-14 ENCOUNTER — Other Ambulatory Visit: Payer: Self-pay | Admitting: General Surgery

## 2017-01-15 ENCOUNTER — Encounter: Payer: Self-pay | Admitting: Cardiology

## 2017-01-15 ENCOUNTER — Ambulatory Visit (INDEPENDENT_AMBULATORY_CARE_PROVIDER_SITE_OTHER): Payer: PPO | Admitting: Cardiology

## 2017-01-15 VITALS — BP 134/89 | HR 65 | Ht 69.0 in | Wt 184.0 lb

## 2017-01-15 DIAGNOSIS — I679 Cerebrovascular disease, unspecified: Secondary | ICD-10-CM | POA: Diagnosis not present

## 2017-01-15 DIAGNOSIS — I359 Nonrheumatic aortic valve disorder, unspecified: Secondary | ICD-10-CM | POA: Diagnosis not present

## 2017-01-15 DIAGNOSIS — H2703 Aphakia, bilateral: Secondary | ICD-10-CM | POA: Diagnosis not present

## 2017-01-15 NOTE — Patient Instructions (Signed)
Medication Instructions:   NO CHANGE  Testing/Procedures:  Your physician has requested that you have an echocardiogram. Echocardiography is a painless test that uses sound waves to create images of your heart. It provides your doctor with information about the size and shape of your heart and how well your heart's chambers and valves are working. This procedure takes approximately one hour. There are no restrictions for this procedure.   Your physician has requested that you have a carotid duplex. This test is an ultrasound of the carotid arteries in your neck. It looks at blood flow through these arteries that supply the brain with blood. Allow one hour for this exam. There are no restrictions or special instructions.    Follow-Up:  Your physician wants you to follow-up in: ONE YEAR WITH DR CRENSHAW You will receive a reminder letter in the mail two months in advance. If you don't receive a letter, please call our office to schedule the follow-up appointment.   If you need a refill on your cardiac medications before your next appointment, please call your pharmacy.    

## 2017-01-27 DIAGNOSIS — K869 Disease of pancreas, unspecified: Secondary | ICD-10-CM | POA: Diagnosis not present

## 2017-01-29 ENCOUNTER — Ambulatory Visit (HOSPITAL_BASED_OUTPATIENT_CLINIC_OR_DEPARTMENT_OTHER)
Admission: RE | Admit: 2017-01-29 | Discharge: 2017-01-29 | Disposition: A | Discharge status: Home / Self Care | Payer: PPO | Source: Ambulatory Visit | Attending: Cardiology | Admitting: Cardiology

## 2017-01-29 DIAGNOSIS — I340 Nonrheumatic mitral (valve) insufficiency: Secondary | ICD-10-CM | POA: Insufficient documentation

## 2017-01-29 DIAGNOSIS — I350 Nonrheumatic aortic (valve) stenosis: Secondary | ICD-10-CM | POA: Insufficient documentation

## 2017-01-29 DIAGNOSIS — I6523 Occlusion and stenosis of bilateral carotid arteries: Secondary | ICD-10-CM | POA: Insufficient documentation

## 2017-01-29 DIAGNOSIS — I359 Nonrheumatic aortic valve disorder, unspecified: Secondary | ICD-10-CM | POA: Diagnosis not present

## 2017-01-29 DIAGNOSIS — I679 Cerebrovascular disease, unspecified: Secondary | ICD-10-CM | POA: Insufficient documentation

## 2017-01-29 DIAGNOSIS — I63233 Cerebral infarction due to unspecified occlusion or stenosis of bilateral carotid arteries: Secondary | ICD-10-CM | POA: Diagnosis not present

## 2017-01-29 NOTE — Progress Notes (Signed)
Echocardiogram 2D Echocardiogram has been performed.  Nathaniel Richard 01/29/2017, 10:00 AM

## 2017-02-04 ENCOUNTER — Encounter: Payer: PPO | Admitting: Cardiology

## 2017-02-04 DIAGNOSIS — K869 Disease of pancreas, unspecified: Secondary | ICD-10-CM | POA: Diagnosis not present

## 2017-02-05 ENCOUNTER — Other Ambulatory Visit: Payer: Self-pay | Admitting: Cardiology

## 2017-02-05 ENCOUNTER — Encounter: Payer: Self-pay | Admitting: Cardiology

## 2017-02-05 ENCOUNTER — Encounter: Payer: Self-pay | Admitting: *Deleted

## 2017-02-05 ENCOUNTER — Ambulatory Visit (INDEPENDENT_AMBULATORY_CARE_PROVIDER_SITE_OTHER): Payer: PPO | Admitting: Cardiology

## 2017-02-05 VITALS — BP 126/88 | HR 68 | Ht 69.0 in | Wt 184.4 lb

## 2017-02-05 DIAGNOSIS — I442 Atrioventricular block, complete: Secondary | ICD-10-CM | POA: Diagnosis not present

## 2017-02-05 DIAGNOSIS — Z45010 Encounter for checking and testing of cardiac pacemaker pulse generator [battery]: Secondary | ICD-10-CM

## 2017-02-05 NOTE — Progress Notes (Signed)
Electrophysiology Office Note   Date:  02/05/2017   ID:  [name], [name] 01/08/1951, MRN [id number]  PCP:  Nicoletta Dress, MD  Cardiologist:  Stanford Breed Primary Electrophysiologist:  Eryn Krejci Meredith Leeds, MD    Chief Complaint  Patient presents with  . Pacemaker Check    Complete heart block     History of Present Illness: Nathaniel Richard is a 66 y.o. male who presents today for electrophysiology evaluation.   He has a history of pacemaker placed 8 years ago in [facility] by report.Patient had aortic valve replacement in December 2014 at [facility]. He was having extreme fatigue when his pacemaker was placed, with HR in the high 20s at times.  He otherwise has felt well.  Had a hip replacement in 2013 as well which was uncomplicated.    Today, he denies symptoms of palpitations, chest pain, orthopnea, PND, lower extremity edema, claudication, dizziness, presyncope, syncope, bleeding, or neurologic sequela. The patient is tolerating medications without difficulties. He does say that he has been more short of breath for the last few weeks. His pacemaker battery went EOL and has switched modes to VVI 65. He has a pancreatic mass and has been going to [facility] treatment. Planned resection in April 2018.  Past Medical History:  Diagnosis Date  . Arthritis   . Hypertension   . Pacemaker    Past Surgical History:  Procedure Laterality Date  . AORTIC VALVE REPLACEMENT    . EUS N/A 12/19/2016   Procedure: UPPER ENDOSCOPIC ULTRASOUND (EUS) RADIAL;  Surgeon: Milus Banister, MD;  [facility];  Service: Endoscopy;  Laterality: N/A;  . EUS N/A 12/19/2016   Procedure: UPPER ENDOSCOPIC ULTRASOUND (EUS) LINEAR;  Surgeon: Milus Banister, MD;  [facility];  Service: Endoscopy;  Laterality: N/A;  . EYE SURGERY     bilat cataracts  . HEMORRHOID SURGERY    . INSERT / REPLACE / REMOVE PACEMAKER    . TOTAL HIP ARTHROPLASTY  07/29/2012   Procedure: TOTAL HIP ARTHROPLASTY;   Surgeon: Gearlean Alf, MD;  [facility];  Service: Orthopedics;  Laterality: Right;     Current Outpatient Prescriptions  Medication Sig Dispense Refill  . amLODipine (NORVASC) 5 MG tablet Take 1 tablet (5 mg total) by mouth daily. 180 tablet 3  . aspirin 81 MG tablet Take 81 mg by mouth daily.    . cetirizine (ZYRTEC) 10 MG tablet Take 10 mg by mouth daily as needed for allergies or rhinitis.     . fluticasone (FLONASE) 50 MCG/ACT nasal spray Place 1 spray into both nostrils daily as needed for allergies or rhinitis.    . ranitidine (ZANTAC) 150 MG tablet Take 150 mg by mouth at bedtime.      No current facility-administered medications for this visit.     Allergies:   Robaxin [methocarbamol]   Social History:  The patient  reports that he has never smoked. He has never used smokeless tobacco. He reports that he does not drink alcohol or use drugs.   Family History:  The patient's family history includes Dementia in his mother; Diabetes in his mother; Heart failure in his father; Hypertension in his father and mother.    ROS:  Please see the history of present illness.   Otherwise, review of systems is positive for SOB, fatigue.   All other systems are reviewed and negative.    PHYSICAL EXAM: VS:  BP 126/88   Pulse 68   Ht 5'  9" (1.753 m)   Wt 184 lb 6.4 oz (83.6 kg)   BMI 27.23 kg/m  , BMI Body mass index is 27.23 kg/m. GEN: Well nourished, well developed, in no acute distress  HEENT: normal  Neck: no JVD, carotid bruits, or masses Cardiac: RRR; no murmurs, rubs, or gallops,no edema  Respiratory:  clear to auscultation bilaterally, normal work of breathing GI: soft, nontender, nondistended, + BS MS: no deformity or atrophy  Skin: warm and dry,  device pocket is well healed Neuro:  Strength and sensation are intact Psych: euthymic mood, full affect  EKG:  EKG is not ordered today. Personal review of the ekg ordered 01/15/17 shows A sense, V pace  Device  interrogation is reviewed today in detail.  See PaceArt for details.   Recent Labs: No results found for requested labs within last 8760 hours.    Lipid Panel  No results found for: CHOL, TRIG, HDL, CHOLHDL, VLDL, LDLCALC, LDLDIRECT   Wt Readings from Last 3 Encounters:  02/05/17 184 lb 6.4 oz (83.6 kg)  01/15/17 184 lb (83.5 kg)  12/24/16 185 lb 9.6 oz (84.2 kg)      Other studies Reviewed: Additional studies/ records that were reviewed today include: TTE 01/29/17 Review of the above records today demonstrates:  - Left ventricle: The cavity size was normal. Wall thickness was   normal. Systolic function was normal. The estimated ejection   fraction was in the range of 60% to 65%. Wall motion was normal;   there were no regional wall motion abnormalities. Features are   consistent with a pseudonormal left ventricular filling pattern,   with concomitant abnormal relaxation and increased filling   pressure (grade 2 diastolic dysfunction). - Aortic valve: Mildly calcified annulus. Moderately thickened,   moderately calcified leaflets. There was mild stenosis. Valve   area (VTI): 0.99 cm^2. Valve area (Vmax): 0.83 cm^2. Valve area   (Vmean): 0.8 cm^2. - Mitral valve: There was mild regurgitation. - Tricuspid valve: There was mild-moderate regurgitation.   ASSESSMENT AND PLAN:  1.  Pacemaker: functioning appropriately.  Have hooked him up to have remote monitoring.  Went EOL and is pacing at VVI 65. Sherif Millspaugh plan for generator change tomorrow. Risks and benefits discussed. Risks include but not limited to bleeding, infection, among others. He understands the risks and has agreed to generator change.  2. Hypertension: on norvasc.  Well controlled today. No changes necessary at this time.  Current medicines are reviewed at length with the patient today.   The patient does not have concerns regarding his medicines.  The following changes were made today:  none  Labs/ tests ordered  today include: CBC, BMP No orders of the defined types were placed in this encounter.    Disposition:   FU with Truitt Cruey 3  months  Signed, Karrina Lye Meredith Leeds, MD  02/05/2017 3:39 PM     [place] [address] [place] [address] [phone] (office) [phone] (fax)

## 2017-02-05 NOTE — Patient Instructions (Signed)
Medication Instructions:    Your physician recommends that you continue on your current medications as directed. Please refer to the Current Medication list given to you today.  --- If you need a refill on your cardiac medications before your next appointment, please call your pharmacy. ---  Labwork:  None ordered  Testing/Procedures: Your physician has recommended that you have a pacemaker generator change. Please see the instruction sheet given to you today for more information.  Follow-Up:  Your physician recommends that you schedule a wound check appointment in 10-14 days, after your procedure on 02/06/2017, with the device clinic.   Your physician recommends that you schedule a follow up appointment in 3 months, after your procedure on 02/06/2017, with Dr. Curt Bears.  Thank you for choosing [facility]!!   Trinidad Curet, RN [phone]

## 2017-02-06 ENCOUNTER — Encounter (HOSPITAL_COMMUNITY)
Admission: RE | Disposition: A | Discharge status: Home / Self Care | Payer: Self-pay | Source: Ambulatory Visit | Attending: Cardiology

## 2017-02-06 ENCOUNTER — Ambulatory Visit (HOSPITAL_COMMUNITY)
Admission: RE | Admit: 2017-02-06 | Discharge: 2017-02-06 | Disposition: A | Discharge status: Home / Self Care | Payer: PPO | Source: Ambulatory Visit | Attending: Cardiology | Admitting: Cardiology

## 2017-02-06 ENCOUNTER — Encounter (HOSPITAL_COMMUNITY): Payer: Self-pay | Admitting: Cardiology

## 2017-02-06 ENCOUNTER — Other Ambulatory Visit: Payer: Self-pay | Admitting: Cardiology

## 2017-02-06 DIAGNOSIS — I10 Essential (primary) hypertension: Secondary | ICD-10-CM

## 2017-02-06 DIAGNOSIS — I442 Atrioventricular block, complete: Secondary | ICD-10-CM | POA: Insufficient documentation

## 2017-02-06 DIAGNOSIS — Z45010 Encounter for checking and testing of cardiac pacemaker pulse generator [battery]: Secondary | ICD-10-CM | POA: Diagnosis not present

## 2017-02-06 HISTORY — PX: PPM GENERATOR CHANGEOUT: EP1233

## 2017-02-06 LAB — SURGICAL PCR SCREEN
MRSA, PCR: NEGATIVE
STAPHYLOCOCCUS AUREUS: NEGATIVE

## 2017-02-06 LAB — BASIC METABOLIC PANEL
ANION GAP: 7 (ref 5–15)
BUN: 17 mg/dL (ref 6–20)
CALCIUM: 9.2 mg/dL (ref 8.9–10.3)
CO2: 24 mmol/L (ref 22–32)
Chloride: 108 mmol/L (ref 101–111)
Creatinine, Ser: 1.13 mg/dL (ref 0.61–1.24)
GFR calc Af Amer: >60 mL/min (ref >60)
GLUCOSE: 66 mg/dL (ref 65–99)
Potassium: 4.5 mmol/L (ref 3.5–5.1)
SODIUM: 139 mmol/L (ref 135–145)

## 2017-02-06 LAB — CBC
HCT: 49.4 % (ref 39.0–52.0)
HEMOGLOBIN: 16.9 g/dL (ref 13.0–17.0)
MCH: 31.1 pg (ref 26.0–34.0)
MCHC: 34.2 g/dL (ref 30.0–36.0)
MCV: 90.8 fL (ref 78.0–100.0)
Platelets: 185 10*3/uL (ref 150–400)
RBC: 5.44 MIL/uL (ref 4.22–5.81)
RDW: 13.6 % (ref 11.5–15.5)
WBC: 6.7 10*3/uL (ref 4.0–10.5)

## 2017-02-06 SURGERY — PPM GENERATOR CHANGEOUT
Anesthesia: LOCAL

## 2017-02-06 MED ORDER — ONDANSETRON HCL 4 MG/2ML IJ SOLN: 4.0000 mg | Freq: Four times a day (QID) | INTRAMUSCULAR | Status: DC | PRN

## 2017-02-06 MED ORDER — SODIUM CHLORIDE 0.9 % IR SOLN
Status: AC
  Filled 2017-02-06: qty 2

## 2017-02-06 MED ORDER — ACETAMINOPHEN 325 MG PO TABS: 325.0000 mg | ORAL_TABLET | ORAL | Status: DC | PRN

## 2017-02-06 MED ORDER — FAMOTIDINE 10 MG PO TABS: 10.0000 mg | ORAL_TABLET | Freq: Every day | ORAL | Status: DC

## 2017-02-06 MED ORDER — MUPIROCIN 2 % EX OINT
1.0000 "application " | TOPICAL_OINTMENT | Freq: Once | CUTANEOUS | Status: AC
  Administered 2017-02-06: 1 via TOPICAL

## 2017-02-06 MED ORDER — CEFAZOLIN IN D5W 1 GM/50ML IV SOLN: 1.0000 g | Freq: Four times a day (QID) | INTRAVENOUS | Status: DC

## 2017-02-06 MED ORDER — FLUTICASONE PROPIONATE 50 MCG/ACT NA SUSP: 1.0000 | Freq: Every day | NASAL | Status: DC | PRN

## 2017-02-06 MED ORDER — AMLODIPINE BESYLATE 5 MG PO TABS: 5.0000 mg | ORAL_TABLET | Freq: Every day | ORAL | Status: DC

## 2017-02-06 MED ORDER — LIDOCAINE HCL (PF) 1 % IJ SOLN
INTRAMUSCULAR | Status: AC
  Filled 2017-02-06: qty 60

## 2017-02-06 MED ORDER — SODIUM CHLORIDE 0.9 % IV SOLN
INTRAVENOUS | Status: DC
  Administered 2017-02-06: 09:00:00 via INTRAVENOUS

## 2017-02-06 MED ORDER — CEFAZOLIN SODIUM-DEXTROSE 2-4 GM/100ML-% IV SOLN
2.0000 g | INTRAVENOUS | Status: AC
  Administered 2017-02-06: 2 g via INTRAVENOUS

## 2017-02-06 MED ORDER — ASPIRIN 81 MG PO TABS: 81.0000 mg | ORAL_TABLET | Freq: Every day | ORAL | Status: DC

## 2017-02-06 MED ORDER — FENTANYL CITRATE (PF) 100 MCG/2ML IJ SOLN
INTRAMUSCULAR | Status: AC
Start: 2017-02-06 — End: 2017-02-06
  Filled 2017-02-06: qty 2

## 2017-02-06 MED ORDER — FENTANYL CITRATE (PF) 100 MCG/2ML IJ SOLN
INTRAMUSCULAR | Status: DC | PRN
  Administered 2017-02-06 (×3): 25 ug via INTRAVENOUS

## 2017-02-06 MED ORDER — CEFAZOLIN SODIUM-DEXTROSE 2-4 GM/100ML-% IV SOLN
INTRAVENOUS | Status: AC
  Filled 2017-02-06: qty 100

## 2017-02-06 MED ORDER — LORATADINE 10 MG PO TABS: 10.0000 mg | ORAL_TABLET | Freq: Every day | ORAL | Status: DC

## 2017-02-06 MED ORDER — MUPIROCIN 2 % EX OINT
TOPICAL_OINTMENT | CUTANEOUS | Status: AC
  Administered 2017-02-06: 1 via TOPICAL
  Filled 2017-02-06: qty 22

## 2017-02-06 MED ORDER — MIDAZOLAM HCL 5 MG/5ML IJ SOLN
INTRAMUSCULAR | Status: DC | PRN
  Administered 2017-02-06 (×3): 1 mg via INTRAVENOUS

## 2017-02-06 MED ORDER — LIDOCAINE HCL (PF) 1 % IJ SOLN
INTRAMUSCULAR | Status: DC | PRN
  Administered 2017-02-06: 35 mL

## 2017-02-06 MED ORDER — SODIUM CHLORIDE 0.9 % IR SOLN
80.0000 mg | Status: AC
  Administered 2017-02-06: 80 mg

## 2017-02-06 MED ORDER — MIDAZOLAM HCL 5 MG/5ML IJ SOLN
INTRAMUSCULAR | Status: AC
  Filled 2017-02-06: qty 5

## 2017-02-06 SURGICAL SUPPLY — 7 items
CABLE SURGICAL S-101-97-12 (CABLE) ×2 IMPLANT
DEVICE DISSECT PLASMABLAD 3.0S (MISCELLANEOUS) ×1 IMPLANT
IPG PACE AZUR XT DR MRI W1DR01 (Pacemaker) ×1 IMPLANT
PACE AZURE XT DR MRI W1DR01 (Pacemaker) ×2 IMPLANT
PAD DEFIB LIFELINK (PAD) ×2 IMPLANT
PLASMABLADE 3.0S (MISCELLANEOUS) ×2
TRAY PACEMAKER INSERTION (PACKS) ×2 IMPLANT

## 2017-02-06 NOTE — Discharge Instructions (Signed)

## 2017-02-06 NOTE — Telephone Encounter (Signed)
REFILL 

## 2017-02-06 NOTE — H&P (Signed)
Nathaniel Richard is a 66 y.o. male with a history of complete AV block s/p dual chamber pacemaker. Device interrogation shows device at EOL. Plan for generator change. Risks and benefits discussed. Risks include but not limited to bleeding, infection. The patient understands the risks and has agreed to the procedure.  Bishop Vanderwerf Curt Bears, MD 02/06/2017 9:37 AM

## 2017-02-07 ENCOUNTER — Ambulatory Visit: Payer: PPO | Admitting: Cardiology

## 2017-02-07 DIAGNOSIS — K869 Disease of pancreas, unspecified: Secondary | ICD-10-CM | POA: Diagnosis not present

## 2017-02-07 MED FILL — Gentamicin Sulfate Inj 40 MG/ML: INTRAMUSCULAR | Qty: 2 | Status: AC

## 2017-02-07 MED FILL — Cefazolin Sodium-Dextrose IV Solution 2 GM/100ML-4%: INTRAVENOUS | Qty: 100 | Status: AC

## 2017-02-07 MED FILL — Sodium Chloride Irrigation Soln 0.9%: Qty: 500 | Status: AC

## 2017-02-15 DIAGNOSIS — L03115 Cellulitis of right lower limb: Secondary | ICD-10-CM | POA: Diagnosis not present

## 2017-02-19 ENCOUNTER — Ambulatory Visit (INDEPENDENT_AMBULATORY_CARE_PROVIDER_SITE_OTHER): Payer: PPO | Admitting: *Deleted

## 2017-02-19 ENCOUNTER — Telehealth: Payer: Self-pay | Admitting: *Deleted

## 2017-02-19 DIAGNOSIS — I442 Atrioventricular block, complete: Secondary | ICD-10-CM | POA: Diagnosis not present

## 2017-02-19 LAB — CUP PACEART INCLINIC DEVICE CHECK
Battery Remaining Longevity: 132 mo
Battery Voltage: 3.21 V
Brady Statistic AS VP Percent: 98.25 %
Brady Statistic AS VS Percent: 0 %
Implantable Lead Implant Date: 20080903
Implantable Lead Model: 5594
Implantable Pulse Generator Implant Date: 20180308
Lead Channel Impedance Value: 361 Ohm
Lead Channel Impedance Value: 399 Ohm
Lead Channel Pacing Threshold Amplitude: 1.125 V
Lead Channel Pacing Threshold Pulse Width: 0.4 ms
Lead Channel Setting Pacing Amplitude: 2 V
Lead Channel Setting Pacing Amplitude: 2.5 V
Lead Channel Setting Pacing Pulse Width: 0.4 ms
MDC IDC LEAD IMPLANT DT: 20080903
MDC IDC LEAD LOCATION: 753859
MDC IDC LEAD LOCATION: 753860
MDC IDC MSMT LEADCHNL RA PACING THRESHOLD AMPLITUDE: 0.5 V
MDC IDC MSMT LEADCHNL RA PACING THRESHOLD PULSEWIDTH: 0.4 ms
MDC IDC MSMT LEADCHNL RA SENSING INTR AMPL: 4.125 mV
MDC IDC MSMT LEADCHNL RV IMPEDANCE VALUE: 342 Ohm
MDC IDC MSMT LEADCHNL RV IMPEDANCE VALUE: 399 Ohm
MDC IDC SESS DTM: 20180321170148
MDC IDC SET LEADCHNL RV SENSING SENSITIVITY: 5.6 mV
MDC IDC STAT BRADY AP VP PERCENT: 1.75 %
MDC IDC STAT BRADY AP VS PERCENT: 0 %
MDC IDC STAT BRADY RA PERCENT PACED: 1.73 %
MDC IDC STAT BRADY RV PERCENT PACED: 100 %

## 2017-02-19 NOTE — Telephone Encounter (Signed)
Spoke with patient regarding earlier openings for PPM wound check.  Patient is agreeable to trying to come to the office sooner than 4:30pm.  He will plan to arrive around 3:30-4pm.  Patient is appreciative of call and denies additional questions or concerns at this time.

## 2017-02-19 NOTE — Progress Notes (Signed)
Wound check appointment. Steri-strips removed. Wound without redness or edema. Incision edges approximated, wound well healed. Normal device function. Thresholds, sensing, and impedances consistent with implant measurements. Device programmed at chronic outputs status post gen change 02/06/17. Histogram distribution appropriate for patient and level of activity. No mode switches or high ventricular rates noted. Patient educated about wound care, arm mobility, lifting restrictions, carelink monitor. ROV with WC 05/13/17.

## 2017-02-25 DIAGNOSIS — K869 Disease of pancreas, unspecified: Secondary | ICD-10-CM | POA: Diagnosis not present

## 2017-02-25 DIAGNOSIS — Q231 Congenital insufficiency of aortic valve: Secondary | ICD-10-CM | POA: Diagnosis not present

## 2017-02-25 DIAGNOSIS — I159 Secondary hypertension, unspecified: Secondary | ICD-10-CM | POA: Diagnosis not present

## 2017-02-25 DIAGNOSIS — Z950 Presence of cardiac pacemaker: Secondary | ICD-10-CM | POA: Diagnosis not present

## 2017-02-25 DIAGNOSIS — Z952 Presence of prosthetic heart valve: Secondary | ICD-10-CM | POA: Diagnosis not present

## 2017-02-25 DIAGNOSIS — I350 Nonrheumatic aortic (valve) stenosis: Secondary | ICD-10-CM | POA: Diagnosis not present

## 2017-02-25 DIAGNOSIS — Z01818 Encounter for other preprocedural examination: Secondary | ICD-10-CM | POA: Diagnosis not present

## 2017-03-05 DIAGNOSIS — Z950 Presence of cardiac pacemaker: Secondary | ICD-10-CM | POA: Diagnosis not present

## 2017-03-05 DIAGNOSIS — I209 Angina pectoris, unspecified: Secondary | ICD-10-CM | POA: Diagnosis not present

## 2017-03-05 DIAGNOSIS — Z952 Presence of prosthetic heart valve: Secondary | ICD-10-CM | POA: Diagnosis not present

## 2017-03-05 DIAGNOSIS — I110 Hypertensive heart disease with heart failure: Secondary | ICD-10-CM | POA: Diagnosis not present

## 2017-03-05 DIAGNOSIS — I509 Heart failure, unspecified: Secondary | ICD-10-CM | POA: Diagnosis not present

## 2017-03-05 DIAGNOSIS — D3A8 Other benign neuroendocrine tumors: Secondary | ICD-10-CM | POA: Diagnosis not present

## 2017-03-05 DIAGNOSIS — C7A094 Malignant carcinoid tumor of the foregut NOS: Secondary | ICD-10-CM | POA: Diagnosis not present

## 2017-03-05 DIAGNOSIS — C7A8 Other malignant neuroendocrine tumors: Secondary | ICD-10-CM | POA: Diagnosis not present

## 2017-03-05 DIAGNOSIS — Z96641 Presence of right artificial hip joint: Secondary | ICD-10-CM | POA: Diagnosis not present

## 2017-03-11 DIAGNOSIS — K869 Disease of pancreas, unspecified: Secondary | ICD-10-CM | POA: Diagnosis not present

## 2017-03-13 DIAGNOSIS — Z5181 Encounter for therapeutic drug level monitoring: Secondary | ICD-10-CM | POA: Diagnosis not present

## 2017-03-13 DIAGNOSIS — D3A8 Other benign neuroendocrine tumors: Secondary | ICD-10-CM | POA: Diagnosis not present

## 2017-03-13 DIAGNOSIS — R140 Abdominal distension (gaseous): Secondary | ICD-10-CM | POA: Diagnosis not present

## 2017-03-13 DIAGNOSIS — D72829 Elevated white blood cell count, unspecified: Secondary | ICD-10-CM | POA: Diagnosis not present

## 2017-03-21 DIAGNOSIS — D3A8 Other benign neuroendocrine tumors: Secondary | ICD-10-CM | POA: Diagnosis not present

## 2017-03-21 DIAGNOSIS — C7A8 Other malignant neuroendocrine tumors: Secondary | ICD-10-CM | POA: Diagnosis not present

## 2017-04-01 DIAGNOSIS — Z79899 Other long term (current) drug therapy: Secondary | ICD-10-CM | POA: Diagnosis not present

## 2017-04-01 DIAGNOSIS — Z952 Presence of prosthetic heart valve: Secondary | ICD-10-CM | POA: Diagnosis not present

## 2017-04-01 DIAGNOSIS — D3A8 Other benign neuroendocrine tumors: Secondary | ICD-10-CM | POA: Diagnosis not present

## 2017-04-01 DIAGNOSIS — Z950 Presence of cardiac pacemaker: Secondary | ICD-10-CM | POA: Diagnosis not present

## 2017-04-01 DIAGNOSIS — Z7982 Long term (current) use of aspirin: Secondary | ICD-10-CM | POA: Diagnosis not present

## 2017-04-01 DIAGNOSIS — I472 Ventricular tachycardia: Secondary | ICD-10-CM | POA: Diagnosis not present

## 2017-04-01 DIAGNOSIS — R221 Localized swelling, mass and lump, neck: Secondary | ICD-10-CM | POA: Diagnosis not present

## 2017-04-11 DIAGNOSIS — K8689 Other specified diseases of pancreas: Secondary | ICD-10-CM | POA: Diagnosis not present

## 2017-04-11 DIAGNOSIS — C7A8 Other malignant neuroendocrine tumors: Secondary | ICD-10-CM | POA: Diagnosis not present

## 2017-04-14 DIAGNOSIS — Z96641 Presence of right artificial hip joint: Secondary | ICD-10-CM | POA: Diagnosis not present

## 2017-04-14 DIAGNOSIS — Z950 Presence of cardiac pacemaker: Secondary | ICD-10-CM | POA: Diagnosis not present

## 2017-04-14 DIAGNOSIS — R932 Abnormal findings on diagnostic imaging of liver and biliary tract: Secondary | ICD-10-CM | POA: Diagnosis not present

## 2017-04-14 DIAGNOSIS — K8689 Other specified diseases of pancreas: Secondary | ICD-10-CM | POA: Diagnosis not present

## 2017-04-14 DIAGNOSIS — Z79899 Other long term (current) drug therapy: Secondary | ICD-10-CM | POA: Diagnosis not present

## 2017-04-14 DIAGNOSIS — K869 Disease of pancreas, unspecified: Secondary | ICD-10-CM | POA: Diagnosis not present

## 2017-04-14 DIAGNOSIS — Z7982 Long term (current) use of aspirin: Secondary | ICD-10-CM | POA: Diagnosis not present

## 2017-04-14 DIAGNOSIS — I10 Essential (primary) hypertension: Secondary | ICD-10-CM | POA: Diagnosis not present

## 2017-04-14 DIAGNOSIS — Z8679 Personal history of other diseases of the circulatory system: Secondary | ICD-10-CM | POA: Diagnosis not present

## 2017-04-24 DIAGNOSIS — Z6826 Body mass index (BMI) 26.0-26.9, adult: Secondary | ICD-10-CM | POA: Diagnosis not present

## 2017-04-24 DIAGNOSIS — M1A9XX0 Chronic gout, unspecified, without tophus (tophi): Secondary | ICD-10-CM | POA: Diagnosis not present

## 2017-04-25 DIAGNOSIS — C7A8 Other malignant neuroendocrine tumors: Secondary | ICD-10-CM | POA: Diagnosis not present

## 2017-04-30 ENCOUNTER — Encounter: Payer: Self-pay | Admitting: Cardiology

## 2017-05-02 DIAGNOSIS — C7A8 Other malignant neuroendocrine tumors: Secondary | ICD-10-CM | POA: Diagnosis not present

## 2017-05-13 ENCOUNTER — Ambulatory Visit (INDEPENDENT_AMBULATORY_CARE_PROVIDER_SITE_OTHER): Payer: PPO | Admitting: Cardiology

## 2017-05-13 ENCOUNTER — Encounter: Payer: Self-pay | Admitting: Cardiology

## 2017-05-13 VITALS — BP 120/92 | HR 81 | Ht 69.0 in | Wt 181.0 lb

## 2017-05-13 DIAGNOSIS — Z950 Presence of cardiac pacemaker: Secondary | ICD-10-CM

## 2017-05-13 DIAGNOSIS — I442 Atrioventricular block, complete: Secondary | ICD-10-CM | POA: Diagnosis not present

## 2017-05-13 DIAGNOSIS — Z952 Presence of prosthetic heart valve: Secondary | ICD-10-CM

## 2017-05-13 DIAGNOSIS — I10 Essential (primary) hypertension: Secondary | ICD-10-CM | POA: Diagnosis not present

## 2017-05-13 LAB — CUP PACEART INCLINIC DEVICE CHECK
Battery Remaining Longevity: 126 mo
Brady Statistic AP VP Percent: 2.97 %
Brady Statistic AP VS Percent: 0 %
Brady Statistic RV Percent Paced: 100 %
Date Time Interrogation Session: 20180612093639
Implantable Lead Implant Date: 20080903
Implantable Lead Location: 753859
Implantable Lead Location: 753860
Implantable Lead Model: 5076
Implantable Lead Model: 5594
Implantable Pulse Generator Implant Date: 20180308
Lead Channel Impedance Value: 323 Ohm
Lead Channel Impedance Value: 342 Ohm
Lead Channel Pacing Threshold Amplitude: 0.5 V
Lead Channel Pacing Threshold Pulse Width: 0.4 ms
Lead Channel Pacing Threshold Pulse Width: 0.4 ms
Lead Channel Sensing Intrinsic Amplitude: 3.25 mV
Lead Channel Setting Pacing Amplitude: 2 V
Lead Channel Setting Pacing Amplitude: 2.5 V
Lead Channel Setting Sensing Sensitivity: 5.6 mV
MDC IDC LEAD IMPLANT DT: 20080903
MDC IDC MSMT BATTERY VOLTAGE: 3.16 V
MDC IDC MSMT LEADCHNL RA IMPEDANCE VALUE: 380 Ohm
MDC IDC MSMT LEADCHNL RA SENSING INTR AMPL: 4.75 mV
MDC IDC MSMT LEADCHNL RV IMPEDANCE VALUE: 380 Ohm
MDC IDC MSMT LEADCHNL RV PACING THRESHOLD AMPLITUDE: 1 V
MDC IDC SET LEADCHNL RV PACING PULSEWIDTH: 0.4 ms
MDC IDC STAT BRADY AS VP PERCENT: 97.03 %
MDC IDC STAT BRADY AS VS PERCENT: 0 %
MDC IDC STAT BRADY RA PERCENT PACED: 2.97 %

## 2017-05-13 NOTE — Progress Notes (Signed)
Electrophysiology Office Note   Date:  05/13/2017   ID:  Nathaniel Richard, Nathaniel Richard 1951-01-23, MRN [id number]  PCP:  Nicoletta Dress, MD  Cardiologist:  Stanford Breed Primary Electrophysiologist:  Rollo Farquhar Meredith Leeds, MD    Chief Complaint  Patient presents with  . Pacemaker Check     History of Present Illness: Nathaniel Richard is a 66 y.o. male who presents today for electrophysiology evaluation.   He has a history of pacemaker placed 8 years ago in [facility] by report.Patient had aortic valve replacement in December 2014 at [facility]. He was having extreme fatigue when his pacemaker was placed, with HR in the high 20s at times.  He otherwise has felt well.  Had a hip replacement in 2013 as well which was uncomplicated.    Today, denies symptoms of palpitations, chest pain, shortness of breath, orthopnea, PND, lower extremity edema, claudication, dizziness, presyncope, syncope, bleeding, or neurologic sequela. The patient is tolerating medications without difficulties and is otherwise without complaint today. He had a generator change on 02/06/17. On 03/05/17, he had surgery for a pancreatic tumor. He had persistent drainage, and the drain was removed just prior to [facility]. He has been improving since that time. He wishes to return back to exercising.  Past Medical History:  Diagnosis Date  . Arthritis   . Hypertension   . Pacemaker    Past Surgical History:  Procedure Laterality Date  . AORTIC VALVE REPLACEMENT    . EUS N/A 12/19/2016   Procedure: UPPER ENDOSCOPIC ULTRASOUND (EUS) RADIAL;  Surgeon: Milus Banister, MD;  [facility];  Service: Endoscopy;  Laterality: N/A;  . EUS N/A 12/19/2016   Procedure: UPPER ENDOSCOPIC ULTRASOUND (EUS) LINEAR;  Surgeon: Milus Banister, MD;  [facility];  Service: Endoscopy;  Laterality: N/A;  . EYE SURGERY     bilat cataracts  . HEMORRHOID SURGERY    . INSERT / REPLACE / REMOVE PACEMAKER    . PPM GENERATOR CHANGEOUT N/A  02/06/2017   Procedure: PPM Generator Changeout;  Surgeon: Harlym Gehling Meredith Leeds, MD;  Location: [place] CV LAB;  Service: Cardiovascular;  Laterality: N/A;  . TOTAL HIP ARTHROPLASTY  07/29/2012   Procedure: TOTAL HIP ARTHROPLASTY;  Surgeon: Gearlean Alf, MD;  [facility];  Service: Orthopedics;  Laterality: Right;     Current Outpatient Prescriptions  Medication Sig Dispense Refill  . amLODipine (NORVASC) 5 MG tablet TAKE ONE TABLET BY MOUTH DAILY 180 tablet 1  . aspirin 81 MG tablet Take 81 mg by mouth daily.    . cetirizine (ZYRTEC) 10 MG tablet Take 10 mg by mouth daily as needed for allergies or rhinitis.     . fluticasone (FLONASE) 50 MCG/ACT nasal spray Place 1 spray into both nostrils daily as needed for allergies or rhinitis.    . ranitidine (ZANTAC) 150 MG tablet Take 150 mg by mouth at bedtime.      No current facility-administered medications for this visit.     Allergies:   Robaxin [methocarbamol]   Social History:  The patient  reports that he has never smoked. He has never used smokeless tobacco. He reports that he does not drink alcohol or use drugs.   Family History:  The patient's family history includes Dementia in his mother; Diabetes in his mother; Heart failure in his father; Hypertension in his father and mother.    ROS:  Please see the history of present illness.   Otherwise, review of systems is  positive for none.   All other systems are reviewed and negative.     PHYSICAL EXAM: VS:  BP (!) 120/92   Pulse 81   Ht 5\' 9"  (1.753 m)   Wt 181 lb (82.1 kg)   SpO2 97%   BMI 26.73 kg/m  , BMI Body mass index is 26.73 kg/m. GEN: Well nourished, well developed, in no acute distress  HEENT: normal  Neck: no JVD, carotid bruits, or masses Cardiac: RRR; no murmurs, rubs, or gallops,no edema  Respiratory:  clear to auscultation bilaterally, normal work of breathing GI: soft, nontender, nondistended, + BS MS: no deformity or atrophy  Skin: warm and dry,  device site well healed Neuro:  Strength and sensation are intact Psych: euthymic mood, full affect  EKG:  EKG is ordered today. Personal review of the ekg ordered shows  Sinus rhythm, V pacing   Personal review of the device interrogation today. Results in [place]: 02/06/2017: BUN 17; Creatinine, Ser 1.13; Hemoglobin 16.9; Platelets 185; Potassium 4.5; Sodium 139    Lipid Panel  No results found for: CHOL, TRIG, HDL, CHOLHDL, VLDL, LDLCALC, LDLDIRECT   Wt Readings from Last 3 Encounters:  05/13/17 181 lb (82.1 kg)  02/05/17 184 lb 6.4 oz (83.6 kg)  01/15/17 184 lb (83.5 kg)      Other studies Reviewed: Additional studies/ records that were reviewed today include: TTE 01/29/17 Review of the above records today demonstrates:  - Left ventricle: The cavity size was normal. Wall thickness was   normal. Systolic function was normal. The estimated ejection   fraction was in the range of 60% to 65%. Wall motion was normal;   there were no regional wall motion abnormalities. Features are   consistent with a pseudonormal left ventricular filling pattern,   with concomitant abnormal relaxation and increased filling   pressure (grade 2 diastolic dysfunction). - Aortic valve: Mildly calcified annulus. Moderately thickened,   moderately calcified leaflets. There was mild stenosis. Valve   area (VTI): 0.99 cm^2. Valve area (Vmax): 0.83 cm^2. Valve area   (Vmean): 0.8 cm^2. - Mitral valve: There was mild regurgitation. - Tricuspid valve: There was mild-moderate regurgitation.   ASSESSMENT AND PLAN:  1.  Complete AV block: Medtronic dual chamber pacemaker functioning appropriately. Head generator change on 02/06/17. Device functioning appropriately. It have 1 unipolar high impedance alert, but that has since resolved. We'll continue to monitor.  2. Hypertension: Well-controlled on Norvasc  3. Aortic valve replacement: feeling well without issues. Continue current  management  Current medicines are reviewed at length with the patient today.   The patient does not have concerns regarding his medicines.  The following changes were made today:  none  Labs/ tests ordered today include: CBC, BMP No orders of the defined types were placed in this encounter.    Disposition:   FU with Cordaryl Decelles 9  months  Signed, Magenta Schmiesing Meredith Leeds, MD  05/13/2017 8:07 AM     [facility] [address] [place] [address] [phone] (office) [phone] (fax)

## 2017-05-13 NOTE — Patient Instructions (Signed)
Medication Instructions:    Your physician recommends that you continue on your current medications as directed. Please refer to the Current Medication list given to you today.  --- If you need a refill on your cardiac medications before your next appointment, please call your pharmacy. ---  Labwork:  None ordered  Testing/Procedures:  None ordered  Follow-Up: Remote monitoring is used to monitor your Pacemaker of ICD from home. This monitoring reduces the number of office visits required to check your device to one time per year. It allows [place] to keep an eye on the functioning of your device to ensure it is working properly. You are scheduled for a device check from home on 08/12/2017. You may send your transmission at any time that day. If you have a wireless device, the transmission will be sent automatically. After your physician reviews your transmission, you will receive a postcard with your next transmission date.   Your physician wants you to follow-up in: 9 months with Dr. Curt Bears.  You will receive a reminder letter in the mail two months in advance. If you don't receive a letter, please call our office to schedule the follow-up appointment.  Thank you for choosing [facility]!!   Trinidad Curet, RN [phone]

## 2017-05-30 IMAGING — US US CAROTID DUPLEX BILAT
1 series · 13 of 24 positions shown · non-contrast
Comparison: None.

CLINICAL DATA: Cardiovascular disease, hypertension, coronary
disease, pacemaker

EXAM:
BILATERAL CAROTID DUPLEX ULTRASOUND
TECHNIQUE: Gray scale imaging, color Doppler and duplex ultrasound were
performed of bilateral carotid and vertebral arteries in the neck.

[Series 1: us carotid duplex bilat · 0.07mm/px · 13 of 62 slices shown]
[im 1/62]
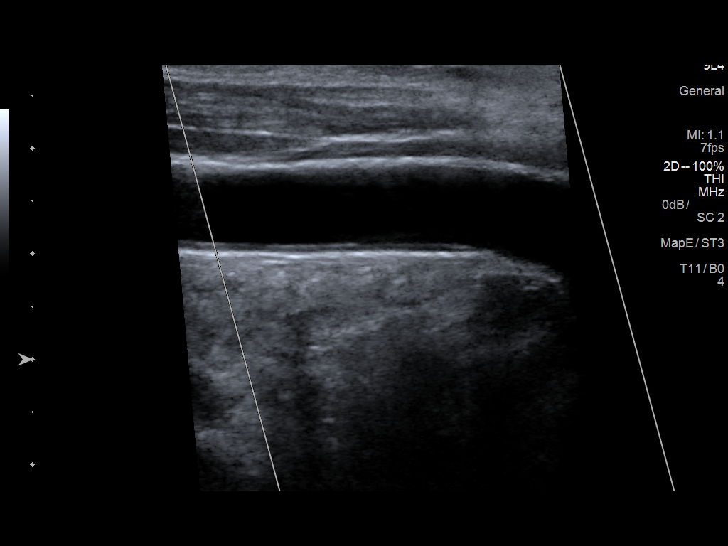
[im 6/62]
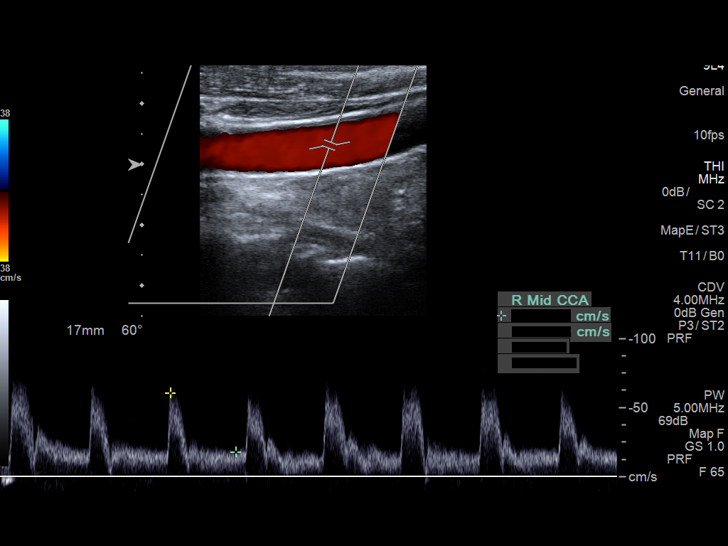
[im 11/62]
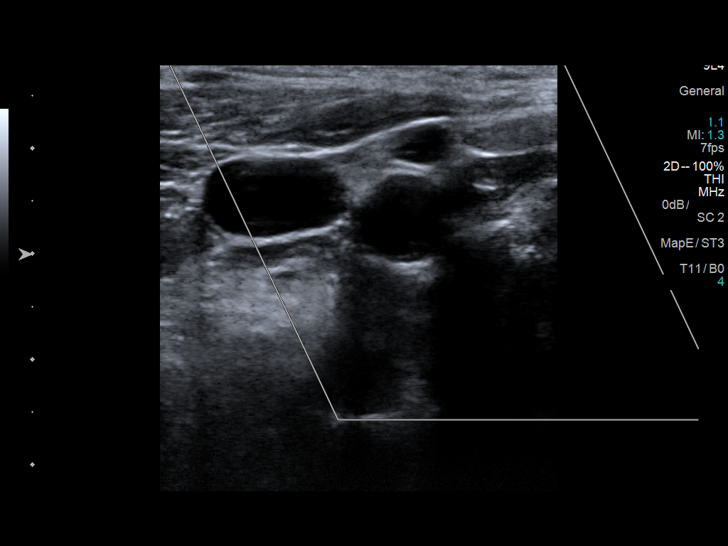
[im 16/62]
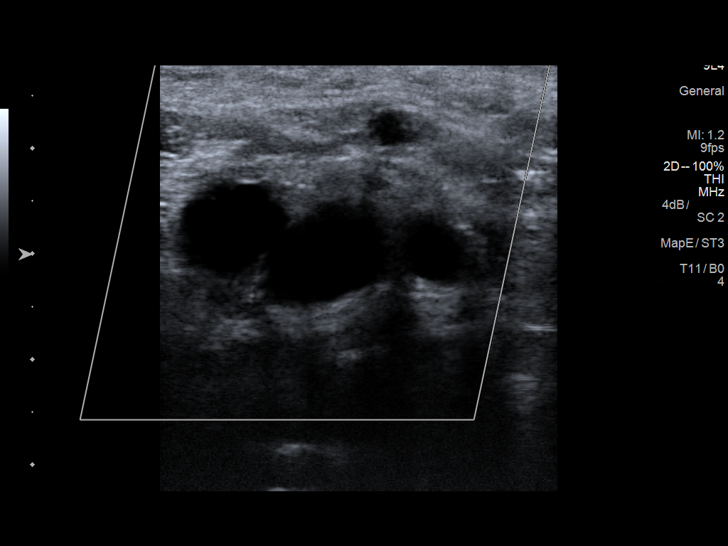
[im 22/62]
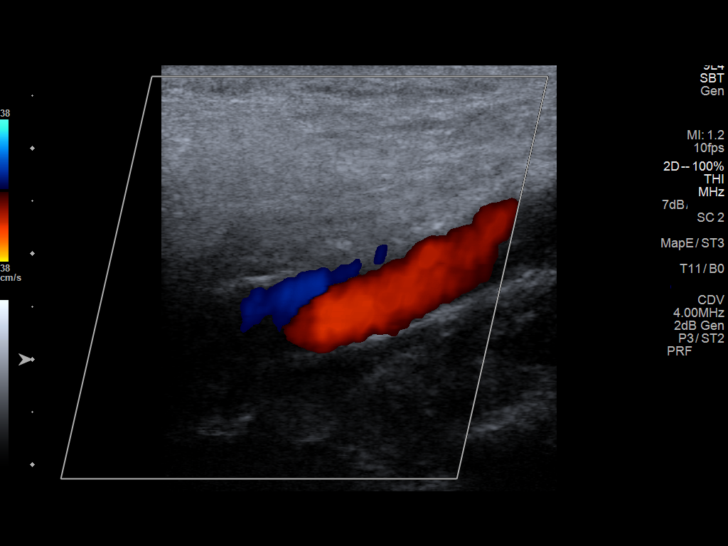
[im 27/62]
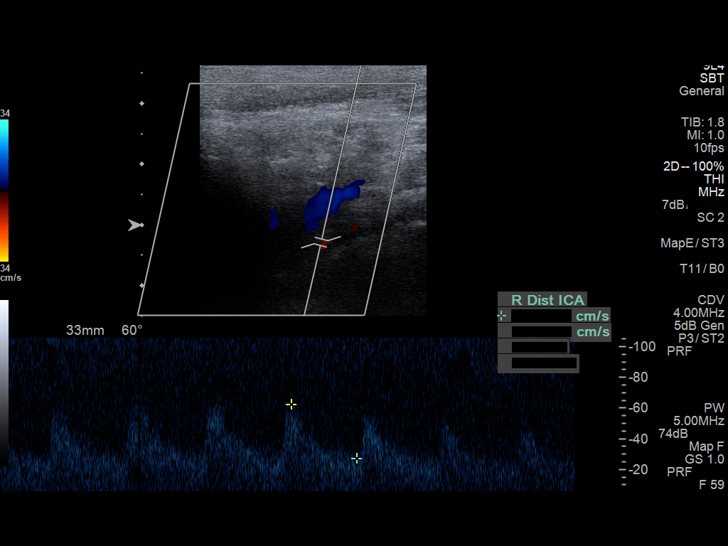
[im 32/62]
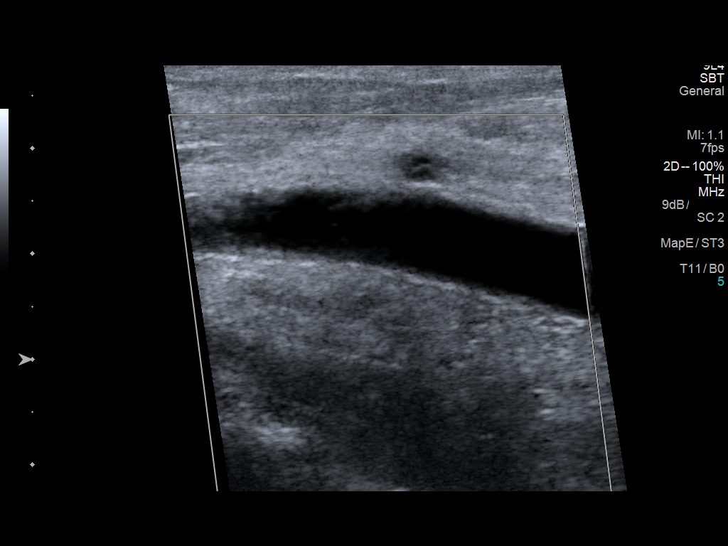
[im 35/62]
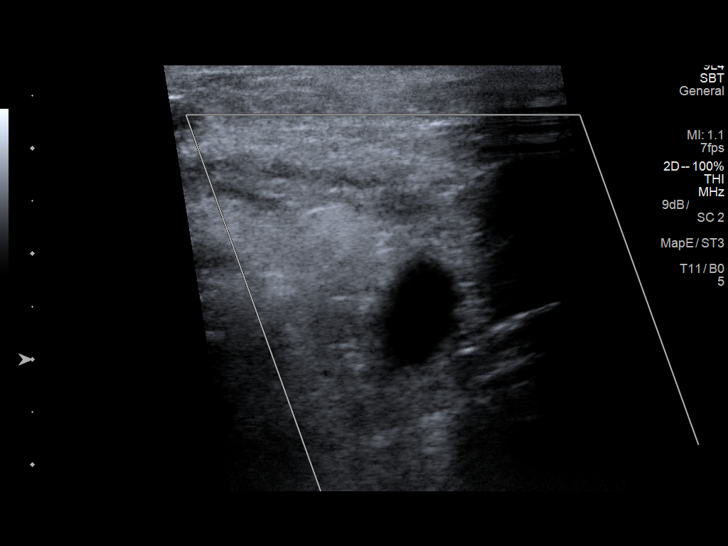
[im 40/62]
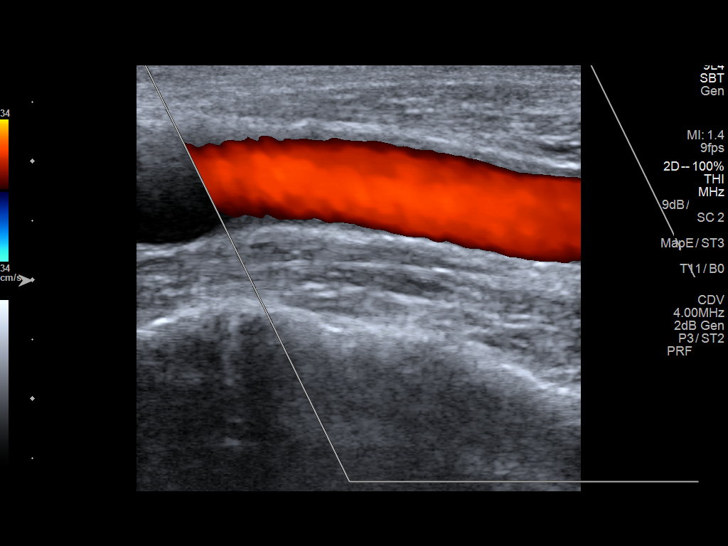
[im 46/62]
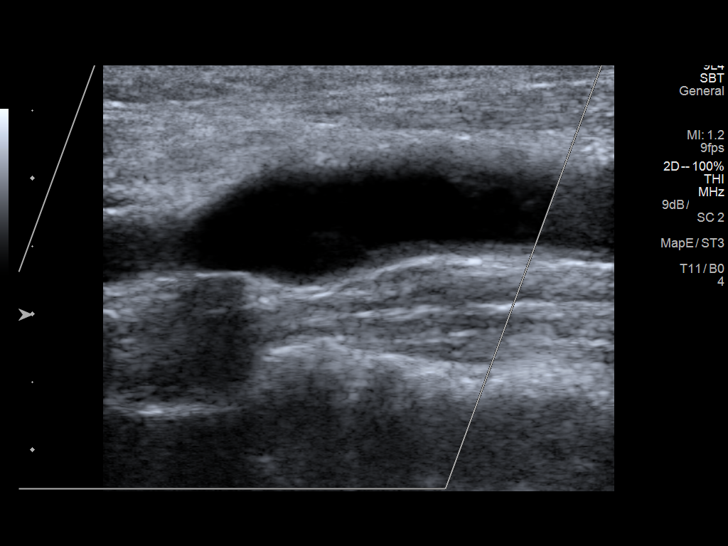
[im 51/62]
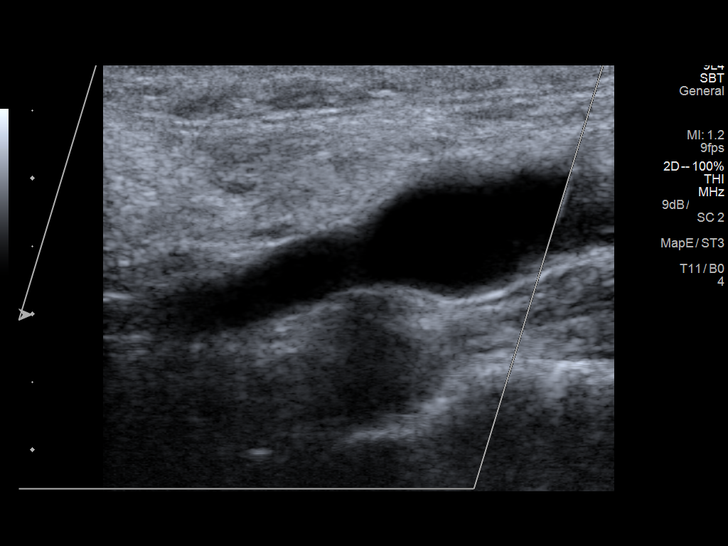
[im 56/62]
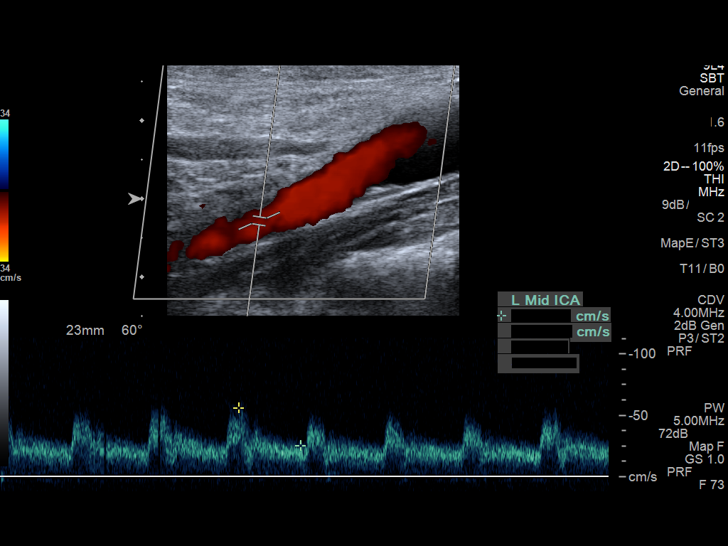
[im 62/62]
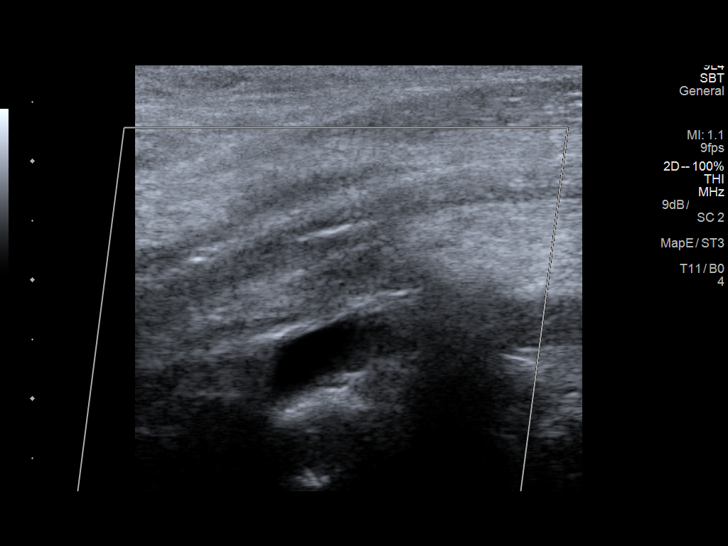

[13 of 24 positions shown; findings below may reference images not displayed]

FINDINGS: Criteria: Quantification of carotid stenosis is based on velocity
parameters that correlate the residual internal carotid diameter
with NASCET-based stenosis levels, using the diameter of the distal
internal carotid lumen as the denominator for stenosis measurement.

The following velocity measurements were obtained:

RIGHT

ICA:  63/27 cm/sec

CCA:  61/18 cm/sec

SYSTOLIC ICA/CCA RATIO:

DIASTOLIC ICA/CCA RATIO:

ECA:  65 cm/sec

LEFT

ICA:  63/27 cm/sec

CCA:  68/21 cm/sec

SYSTOLIC ICA/CCA RATIO:

DIASTOLIC ICA/CCA RATIO:

ECA:  56 cm/sec

RIGHT CAROTID ARTERY: Minor intimal thickening and atherosclerotic
plaque formation. No hemodynamically significant right ICA stenosis,
velocity elevation, or turbulent flow. Degree of narrowing less than
50%.

RIGHT VERTEBRAL ARTERY:  Antegrade

LEFT CAROTID ARTERY: Similar minor intimal thickening and
atherosclerotic plaque formation. No hemodynamically significant
left ICA stenosis, velocity elevation, or turbulent flow.

LEFT VERTEBRAL ARTERY:  Antegrade
IMPRESSION: Minor carotid atherosclerosis. No hemodynamically significant ICA
stenosis. Degree of narrowing less than 50% bilaterally.

Patent antegrade vertebral flow bilaterally

## 2017-06-06 DIAGNOSIS — K8689 Other specified diseases of pancreas: Secondary | ICD-10-CM | POA: Diagnosis not present

## 2017-06-06 DIAGNOSIS — Z7982 Long term (current) use of aspirin: Secondary | ICD-10-CM | POA: Diagnosis not present

## 2017-06-06 DIAGNOSIS — Z950 Presence of cardiac pacemaker: Secondary | ICD-10-CM | POA: Diagnosis not present

## 2017-06-06 DIAGNOSIS — Z4659 Encounter for fitting and adjustment of other gastrointestinal appliance and device: Secondary | ICD-10-CM | POA: Diagnosis not present

## 2017-06-06 DIAGNOSIS — Z888 Allergy status to other drugs, medicaments and biological substances status: Secondary | ICD-10-CM | POA: Diagnosis not present

## 2017-06-18 ENCOUNTER — Other Ambulatory Visit: Payer: Self-pay | Admitting: Cardiology

## 2017-08-12 ENCOUNTER — Encounter: Payer: PPO | Admitting: *Deleted

## 2017-08-12 ENCOUNTER — Telehealth: Payer: Self-pay | Admitting: Cardiology

## 2017-08-12 NOTE — Telephone Encounter (Signed)
Spoke with pt and reminded pt of remote transmission that is due today. Pt verbalized understanding.   

## 2017-08-15 ENCOUNTER — Encounter: Payer: Self-pay | Admitting: Cardiology

## 2017-09-09 ENCOUNTER — Telehealth: Payer: Self-pay | Admitting: Cardiology

## 2017-09-09 DIAGNOSIS — I712 Thoracic aortic aneurysm, without rupture, unspecified: Secondary | ICD-10-CM

## 2017-09-09 NOTE — Telephone Encounter (Signed)
New message    Patient calling to find out the type of CT scan that Dr Stanford Breed will be ordering in December.  Patient going to [facility] to have other CT scans done ordered by [place] [facility], wants to combine the scans needed and get them done at one time.  Please call.

## 2017-09-11 NOTE — Telephone Encounter (Signed)
Alfonso Ramus APP/RN TEAM LEAD  [phone] main office number [phone] fax [phone] cell

## 2017-10-30 NOTE — Telephone Encounter (Signed)
Faxed order, called to confirm received and had to leave message for call back

## 2017-11-06 DIAGNOSIS — Z125 Encounter for screening for malignant neoplasm of prostate: Secondary | ICD-10-CM | POA: Diagnosis not present

## 2017-11-06 DIAGNOSIS — Z0000 Encounter for general adult medical examination without abnormal findings: Secondary | ICD-10-CM | POA: Diagnosis not present

## 2017-11-06 DIAGNOSIS — Z1331 Encounter for screening for depression: Secondary | ICD-10-CM | POA: Diagnosis not present

## 2017-11-06 DIAGNOSIS — Z23 Encounter for immunization: Secondary | ICD-10-CM | POA: Diagnosis not present

## 2017-11-06 DIAGNOSIS — Z9181 History of falling: Secondary | ICD-10-CM | POA: Diagnosis not present

## 2017-11-06 DIAGNOSIS — Z136 Encounter for screening for cardiovascular disorders: Secondary | ICD-10-CM | POA: Diagnosis not present

## 2017-11-06 DIAGNOSIS — E785 Hyperlipidemia, unspecified: Secondary | ICD-10-CM | POA: Diagnosis not present

## 2017-11-06 NOTE — Telephone Encounter (Signed)
Spoke with oncology office and order received

## 2017-11-17 DIAGNOSIS — Z125 Encounter for screening for malignant neoplasm of prostate: Secondary | ICD-10-CM | POA: Diagnosis not present

## 2017-11-17 DIAGNOSIS — Z298 Encounter for other specified prophylactic measures: Secondary | ICD-10-CM | POA: Diagnosis not present

## 2017-11-17 DIAGNOSIS — E785 Hyperlipidemia, unspecified: Secondary | ICD-10-CM | POA: Diagnosis not present

## 2017-11-17 DIAGNOSIS — J3489 Other specified disorders of nose and nasal sinuses: Secondary | ICD-10-CM | POA: Diagnosis not present

## 2017-11-17 DIAGNOSIS — I10 Essential (primary) hypertension: Secondary | ICD-10-CM | POA: Diagnosis not present

## 2017-11-17 DIAGNOSIS — Z139 Encounter for screening, unspecified: Secondary | ICD-10-CM | POA: Diagnosis not present

## 2017-11-20 DIAGNOSIS — D3A8 Other benign neuroendocrine tumors: Secondary | ICD-10-CM | POA: Diagnosis not present

## 2017-11-20 DIAGNOSIS — Z9889 Other specified postprocedural states: Secondary | ICD-10-CM | POA: Diagnosis not present

## 2018-01-20 DIAGNOSIS — E663 Overweight: Secondary | ICD-10-CM | POA: Diagnosis not present

## 2018-01-20 DIAGNOSIS — Z6827 Body mass index (BMI) 27.0-27.9, adult: Secondary | ICD-10-CM | POA: Diagnosis not present

## 2018-01-20 DIAGNOSIS — M545 Low back pain: Secondary | ICD-10-CM | POA: Diagnosis not present

## 2018-01-21 DIAGNOSIS — M545 Low back pain: Secondary | ICD-10-CM | POA: Diagnosis not present

## 2018-01-27 DIAGNOSIS — M545 Low back pain: Secondary | ICD-10-CM | POA: Diagnosis not present

## 2018-01-29 DIAGNOSIS — M545 Low back pain: Secondary | ICD-10-CM | POA: Diagnosis not present

## 2018-02-03 DIAGNOSIS — M545 Low back pain: Secondary | ICD-10-CM | POA: Diagnosis not present

## 2018-02-05 DIAGNOSIS — M545 Low back pain: Secondary | ICD-10-CM | POA: Diagnosis not present

## 2018-02-06 NOTE — Progress Notes (Signed)
HPI: FU AVR. Patient had a pacemaker placed in [place] previously. Had aortic valve replacement in December 2014 at [facility]. CTA December 2017 showed 4.2 cm thoracic aortic aneurysm unchanged from previous. There was a 13 mm abnormality in the pancreas and MRI recommended. Now followed by oncology.   Echocardiogram February 2018 showed normal LV systolic function, grade 2 diastolic dysfunction, mean aortic valve gradient 10 mmHg, mild mitral regurgitation and mild to moderate tricuspid regurgitation.  Carotid Dopplers February 2018 showed no significant stenosis.  Since last seen, the patient denies any dyspnea on exertion, orthopnea, PND, pedal edema, palpitations, syncope or chest pain.   Current Outpatient Medications  Medication Sig Dispense Refill  . amLODipine (NORVASC) 5 MG tablet TAKE ONE TABLET BY MOUTH DAILY 180 tablet 1  . aspirin 81 MG tablet Take 81 mg by mouth daily.    . cetirizine (ZYRTEC) 10 MG tablet Take 10 mg by mouth daily as needed for allergies or rhinitis.     . fluticasone (FLONASE) 50 MCG/ACT nasal spray Place 1 spray into both nostrils daily as needed for allergies or rhinitis.    . ranitidine (ZANTAC) 150 MG tablet Take 150 mg by mouth at bedtime.      No current facility-administered medications for this visit.      Past Medical History:  Diagnosis Date  . Arthritis   . Hypertension   . Pacemaker     Past Surgical History:  Procedure Laterality Date  . AORTIC VALVE REPLACEMENT    . EUS N/A 12/19/2016   Procedure: UPPER ENDOSCOPIC ULTRASOUND (EUS) RADIAL;  Surgeon: Milus Banister, MD;  [facility];  Service: Endoscopy;  Laterality: N/A;  . EUS N/A 12/19/2016   Procedure: UPPER ENDOSCOPIC ULTRASOUND (EUS) LINEAR;  Surgeon: Milus Banister, MD;  [facility];  Service: Endoscopy;  Laterality: N/A;  . EYE SURGERY     bilat cataracts  . HEMORRHOID SURGERY    . INSERT / REPLACE / REMOVE PACEMAKER    . PPM GENERATOR CHANGEOUT N/A  02/06/2017   Procedure: PPM Generator Changeout;  Surgeon: Will Meredith Leeds, MD;  Location: [place] CV LAB;  Service: Cardiovascular;  Laterality: N/A;  . TOTAL HIP ARTHROPLASTY  07/29/2012   Procedure: TOTAL HIP ARTHROPLASTY;  Surgeon: Gearlean Alf, MD;  [facility];  Service: Orthopedics;  Laterality: Right;    Social History   Socioeconomic History  . Marital status: Married    Spouse name: Not on file  . Number of children: 2  . Years of education: Not on file  . Highest education level: Not on file  Social Needs  . Financial resource strain: Not on file  . Food insecurity - worry: Not on file  . Food insecurity - inability: Not on file  . Transportation needs - medical: Not on file  . Transportation needs - non-medical: Not on file  Occupational History  . Not on file  Tobacco Use  . Smoking status: Never Smoker  . Smokeless tobacco: Never Used  Substance and Sexual Activity  . Alcohol use: No  . Drug use: No  . Sexual activity: Not on file  Other Topics Concern  . 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 Diabetes Mother   . Hypertension Mother   .  Dementia Mother   . Heart failure Father   . Hypertension Father     ROS: no fevers or chills, productive cough, hemoptysis, dysphasia, odynophagia, melena, hematochezia, dysuria, hematuria, rash, seizure activity, orthopnea, PND, pedal edema, claudication. Remaining systems are negative.  Physical Exam: Well-developed well-nourished in no acute distress.  Skin is warm and dry.  HEENT is normal.  Neck is supple.  Chest is clear to auscultation with normal expansion.  Cardiovascular exam is regular rate and rhythm.  2/6 systolic murmur left sternal border.  No diastolic murmur. Abdominal exam nontender or distended. No masses  palpated. Extremities show no edema. neuro grossly intact   A/P  1 status post aortic valve replacement-continue SBE prophylaxis.  2 thoracic aortic aneurysm-patient had a CTA of his thoracic aorta at [facility] in December that showed no dilatation.  We will likely plan to repeat one year from that date.  3 hypertension-blood pressure is elevated. However typically controlled. Continue present medications and follow.  4 prior pacemaker-followed by electrophysiology.  Kirk Ruths, MD

## 2018-02-10 DIAGNOSIS — M545 Low back pain: Secondary | ICD-10-CM | POA: Diagnosis not present

## 2018-02-11 ENCOUNTER — Ambulatory Visit (INDEPENDENT_AMBULATORY_CARE_PROVIDER_SITE_OTHER): Payer: PPO | Admitting: Cardiology

## 2018-02-11 ENCOUNTER — Encounter: Payer: Self-pay | Admitting: Cardiology

## 2018-02-11 VITALS — BP 145/94 | HR 74 | Ht 69.0 in | Wt 184.1 lb

## 2018-02-11 DIAGNOSIS — I712 Thoracic aortic aneurysm, without rupture, unspecified: Secondary | ICD-10-CM

## 2018-02-11 DIAGNOSIS — Z952 Presence of prosthetic heart valve: Secondary | ICD-10-CM | POA: Diagnosis not present

## 2018-02-11 DIAGNOSIS — I10 Essential (primary) hypertension: Secondary | ICD-10-CM | POA: Diagnosis not present

## 2018-02-11 DIAGNOSIS — Z950 Presence of cardiac pacemaker: Secondary | ICD-10-CM | POA: Diagnosis not present

## 2018-02-11 NOTE — Patient Instructions (Signed)
Medication Instructions:  Your physician recommends that you continue on your current medications as directed. Please refer to the Current Medication list given to you today.   Labwork: -None  Testing/Procedures: -None  Follow-Up: Your physician wants you to follow-up in: 1 year with Dr. Stanford Breed.  You will receive a reminder letter in the mail two months in advance. If you don't receive a letter, please call our office to schedule the follow-up appointment.  Your physician recommends that you keep your scheduled follow-up appointment with Dr. Curt Bears.    Any Other Special Instructions Will Be Listed Below (If Applicable).     If you need a refill on your cardiac medications before your next appointment, please call your pharmacy.

## 2018-02-12 ENCOUNTER — Other Ambulatory Visit: Payer: Self-pay | Admitting: Cardiology

## 2018-02-12 DIAGNOSIS — M545 Low back pain: Secondary | ICD-10-CM | POA: Diagnosis not present

## 2018-02-12 DIAGNOSIS — I10 Essential (primary) hypertension: Secondary | ICD-10-CM

## 2018-02-17 DIAGNOSIS — M545 Low back pain: Secondary | ICD-10-CM | POA: Diagnosis not present

## 2018-02-24 ENCOUNTER — Encounter: Payer: Self-pay | Admitting: Cardiology

## 2018-02-24 ENCOUNTER — Ambulatory Visit (INDEPENDENT_AMBULATORY_CARE_PROVIDER_SITE_OTHER): Payer: PPO | Admitting: Cardiology

## 2018-02-24 ENCOUNTER — Telehealth: Payer: Self-pay | Admitting: *Deleted

## 2018-02-24 VITALS — BP 124/78 | HR 81 | Ht 69.0 in | Wt 185.8 lb

## 2018-02-24 DIAGNOSIS — Z950 Presence of cardiac pacemaker: Secondary | ICD-10-CM

## 2018-02-24 DIAGNOSIS — Z952 Presence of prosthetic heart valve: Secondary | ICD-10-CM | POA: Diagnosis not present

## 2018-02-24 DIAGNOSIS — I10 Essential (primary) hypertension: Secondary | ICD-10-CM | POA: Diagnosis not present

## 2018-02-24 DIAGNOSIS — I442 Atrioventricular block, complete: Secondary | ICD-10-CM

## 2018-02-24 DIAGNOSIS — T82110A Breakdown (mechanical) of cardiac electrode, initial encounter: Secondary | ICD-10-CM

## 2018-02-24 NOTE — Patient Instructions (Signed)
Medication Instructions:  Your physician recommends that you continue on your current medications as directed. Please refer to the Current Medication list given to you today.  *If you need a refill on your cardiac medications before your next appointment, please call your pharmacy*  Labwork: None ordered  Testing/Procedures: None ordered  Follow-Up: Remote monitoring is used to monitor your Pacemaker or ICD from home. This monitoring reduces the number of office visits required to check your device to one time per year. It allows [place] to keep an eye on the functioning of your device to ensure it is working properly. You are scheduled for a device check from home on 05/26/2018. You may send your transmission at any time that day. If you have a wireless device, the transmission will be sent automatically. After your physician reviews your transmission, you will receive a postcard with your next transmission date.  Your physician wants you to follow-up in: 1 year with Dr. Curt Bears.  You will receive a reminder letter in the mail two months in advance. If you don't receive a letter, please call our office to schedule the follow-up appointment.  Thank you for choosing [facility]!!   Trinidad Curet, RN [phone]  Any Other Special Instructions Will Be Listed Below (If Applicable).

## 2018-02-24 NOTE — Telephone Encounter (Signed)
Spoke with patient and advised that [employer] engineers have finished their review of his PPM data and that it was forwarded to Dr. Curt Bears and our local [employer] rep.  Advised that we should have a plan by tomorrow after reviewing some additional information.  Patient is appreciative of update and agreeable to receiving return call tomorrow.

## 2018-02-24 NOTE — Progress Notes (Signed)
Electrophysiology Office Note   Date:  02/24/2018   ID:  Stein, Windhorst 1951-01-01, MRN [id number]  PCP:  Nicoletta Dress, MD  Cardiologist:  Stanford Breed Primary Electrophysiologist:  Jenaya Saar Meredith Leeds, MD    Chief Complaint  Patient presents with  . Pacemaker Check    Complete heart block     History of Present Illness: Nathaniel Richard is a 67 y.o. male who presents today for electrophysiology evaluation.   He has a history of pacemaker placed 8 years ago in [facility] by report.Patient had aortic valve replacement in December 2014 at [facility]. He was having extreme fatigue when his pacemaker was placed, with HR in the high 20s at times.  He otherwise has felt well.  Had a hip replacement in 2013 as well which was uncomplicated.  Had a Medtronic generator change 02/06/17.  On 03/05/17, had surgery for a pancreatic tumor.  Today, denies symptoms of palpitations, chest pain, shortness of breath, orthopnea, PND, lower extremity edema, claudication, dizziness, presyncope, syncope, bleeding, or neurologic sequela. The patient is tolerating medications without difficulties.  Overall he is feeling well without major complaint.  Past Medical History:  Diagnosis Date  . Arthritis   . Hypertension   . Pacemaker    Past Surgical History:  Procedure Laterality Date  . AORTIC VALVE REPLACEMENT    . EUS N/A 12/19/2016   Procedure: UPPER ENDOSCOPIC ULTRASOUND (EUS) RADIAL;  Surgeon: Milus Banister, MD;  [facility];  Service: Endoscopy;  Laterality: N/A;  . EUS N/A 12/19/2016   Procedure: UPPER ENDOSCOPIC ULTRASOUND (EUS) LINEAR;  Surgeon: Milus Banister, MD;  [facility];  Service: Endoscopy;  Laterality: N/A;  . EYE SURGERY     bilat cataracts  . HEMORRHOID SURGERY    . INSERT / REPLACE / REMOVE PACEMAKER    . PPM GENERATOR CHANGEOUT N/A 02/06/2017   Procedure: PPM Generator Changeout;  Surgeon: Kanika Bungert Meredith Leeds, MD;  Location: [place] CV LAB;  Service:  Cardiovascular;  Laterality: N/A;  . TOTAL HIP ARTHROPLASTY  07/29/2012   Procedure: TOTAL HIP ARTHROPLASTY;  Surgeon: Gearlean Alf, MD;  [facility];  Service: Orthopedics;  Laterality: Right;     Current Outpatient Medications  Medication Sig Dispense Refill  . amLODipine (NORVASC) 5 MG tablet TAKE ONE TABLET BY MOUTH DAILY 180 tablet 1  . aspirin 81 MG tablet Take 81 mg by mouth daily.    . cetirizine (ZYRTEC) 10 MG tablet Take 10 mg by mouth daily as needed for allergies or rhinitis.     . fluticasone (FLONASE) 50 MCG/ACT nasal spray Place 1 spray into both nostrils daily as needed for allergies or rhinitis.    . ranitidine (ZANTAC) 150 MG tablet Take 150 mg by mouth at bedtime.      No current facility-administered medications for this visit.     Allergies:   Methocarbamol   Social History:  The patient  reports that he has never smoked. He has never used smokeless tobacco. He reports that he does not drink alcohol or use drugs.   Family History:  The patient's family history includes Dementia in his mother; Diabetes in his mother; Heart failure in his father; Hypertension in his father and mother.   ROS:  Please see the history of present illness.   Otherwise, review of systems is positive for none.   All other systems are reviewed and negative.   PHYSICAL EXAM: VS:  BP 124/78   Pulse  81   Ht 5\' 9"  (1.753 m)   Wt 185 lb 12.8 oz (84.3 kg)   BMI 27.44 kg/m  , BMI Body mass index is 27.44 kg/m. GEN: Well nourished, well developed, in no acute distress  HEENT: normal  Neck: no JVD, carotid bruits, or masses Cardiac: RRR; no murmurs, rubs, or gallops,no edema  Respiratory:  clear to auscultation bilaterally, normal work of breathing GI: soft, nontender, nondistended, + BS MS: no deformity or atrophy  Skin: warm and dry, device site well healed Neuro:  Strength and sensation are intact Psych: euthymic mood, full affect  EKG:  EKG is ordered today. Personal review  of the ekg ordered shows A sense, V paced  Personal review of the device interrogation today. Results in [place]: No results found for requested labs within last 8760 hours.    Lipid Panel  No results found for: CHOL, TRIG, HDL, CHOLHDL, VLDL, LDLCALC, LDLDIRECT   Wt Readings from Last 3 Encounters:  02/24/18 185 lb 12.8 oz (84.3 kg)  02/11/18 184 lb 1.9 oz (83.5 kg)  05/13/17 181 lb (82.1 kg)      Other studies Reviewed: Additional studies/ records that were reviewed today include: TTE 01/29/17 Review of the above records today demonstrates:  - Left ventricle: The cavity size was normal. Wall thickness was   normal. Systolic function was normal. The estimated ejection   fraction was in the range of 60% to 65%. Wall motion was normal;   there were no regional wall motion abnormalities. Features are   consistent with a pseudonormal left ventricular filling pattern,   with concomitant abnormal relaxation and increased filling   pressure (grade 2 diastolic dysfunction). - Aortic valve: Mildly calcified annulus. Moderately thickened,   moderately calcified leaflets. There was mild stenosis. Valve   area (VTI): 0.99 cm^2. Valve area (Vmax): 0.83 cm^2. Valve area   (Vmean): 0.8 cm^2. - Mitral valve: There was mild regurgitation. - Tricuspid valve: There was mild-moderate regurgitation.   ASSESSMENT AND PLAN:  1.  Complete AV block: Medtronic dual-chamber pacemaker functioning appropriately.  Has had a unipolar impedance alert in the past.  We Hudson Majkowski call tech services to further determine if there is anything we need to do.  Had generator change 02/06/17.    2. Hypertension: Well-controlled on Norvasc  3. Aortic valve replacement: Feeling well without issues.  Continue with current management.  Current medicines are reviewed at length with the patient today.   The patient does not have concerns regarding his medicines.  The following changes were made today:  None  Labs/ tests ordered today include: CBC, BMP Orders Placed This Encounter  Procedures  . EKG 12-Lead     Disposition:   FU with Shannell Mikkelsen 12 months  Signed, Janiqua Friscia Meredith Leeds, MD  02/24/2018 12:16 PM     [place] [address] [phone] (office) [phone] (fax)

## 2018-02-25 DIAGNOSIS — M545 Low back pain: Secondary | ICD-10-CM | POA: Diagnosis not present

## 2018-02-25 NOTE — Telephone Encounter (Signed)
Spoke with patient and advised that I am still awaiting recommendations from Dr. Curt Bears and [employer].  Advised I will call him back as soon as I have an update.  Patient is appreciative and agreeable to plan.

## 2018-02-26 LAB — CUP PACEART INCLINIC DEVICE CHECK
Battery Voltage: 3.01 V
Brady Statistic AP VP Percent: 1.84 %
Brady Statistic AS VP Percent: 98.07 %
Brady Statistic AS VS Percent: 0.1 %
Implantable Lead Implant Date: 20080903
Implantable Lead Location: 753859
Implantable Lead Location: 753860
Implantable Lead Model: 5594
Implantable Pulse Generator Implant Date: 20180308
Lead Channel Impedance Value: 399 Ohm
Lead Channel Impedance Value: 437 Ohm
Lead Channel Impedance Value: 475 Ohm
Lead Channel Pacing Threshold Amplitude: 0.75 V
Lead Channel Pacing Threshold Amplitude: 1 V
Lead Channel Pacing Threshold Pulse Width: 0.4 ms
Lead Channel Sensing Intrinsic Amplitude: 4.25 mV
Lead Channel Setting Pacing Amplitude: 2.5 V
Lead Channel Setting Pacing Pulse Width: 0.4 ms
MDC IDC LEAD IMPLANT DT: 20080903
MDC IDC MSMT BATTERY REMAINING LONGEVITY: 123 mo
MDC IDC MSMT LEADCHNL RA PACING THRESHOLD PULSEWIDTH: 0.4 ms
MDC IDC MSMT LEADCHNL RV IMPEDANCE VALUE: 456 Ohm
MDC IDC MSMT LEADCHNL RV SENSING INTR AMPL: 13.5 mV
MDC IDC SESS DTM: 20190326160646
MDC IDC SET LEADCHNL RA PACING AMPLITUDE: 2 V
MDC IDC SET LEADCHNL RV SENSING SENSITIVITY: 5.6 mV
MDC IDC STAT BRADY AP VS PERCENT: 0 %
MDC IDC STAT BRADY RA PERCENT PACED: 1.84 %
MDC IDC STAT BRADY RV PERCENT PACED: 99.9 %

## 2018-02-27 NOTE — Telephone Encounter (Signed)
Advised patient to have CXR at [facility] hospital Tuesday morning before coming in to see device clinic at 10 am. Patient verbalized understanding and agreeable to plan.

## 2018-03-03 ENCOUNTER — Ambulatory Visit (HOSPITAL_COMMUNITY)
Admission: RE | Admit: 2018-03-03 | Discharge: 2018-03-03 | Disposition: A | Discharge status: Home / Self Care | Payer: PPO | Source: Ambulatory Visit | Attending: Cardiology | Admitting: Cardiology

## 2018-03-03 ENCOUNTER — Ambulatory Visit (INDEPENDENT_AMBULATORY_CARE_PROVIDER_SITE_OTHER): Payer: PPO | Admitting: *Deleted

## 2018-03-03 ENCOUNTER — Encounter: Payer: Self-pay | Admitting: *Deleted

## 2018-03-03 DIAGNOSIS — Z952 Presence of prosthetic heart valve: Secondary | ICD-10-CM | POA: Diagnosis not present

## 2018-03-03 DIAGNOSIS — T82110D Breakdown (mechanical) of cardiac electrode, subsequent encounter: Secondary | ICD-10-CM

## 2018-03-03 DIAGNOSIS — Z950 Presence of cardiac pacemaker: Secondary | ICD-10-CM | POA: Diagnosis not present

## 2018-03-03 DIAGNOSIS — Y839 Surgical procedure, unspecified as the cause of abnormal reaction of the patient, or of later complication, without mention of misadventure at the time of the procedure: Secondary | ICD-10-CM | POA: Diagnosis not present

## 2018-03-03 DIAGNOSIS — I442 Atrioventricular block, complete: Secondary | ICD-10-CM

## 2018-03-03 DIAGNOSIS — T82110A Breakdown (mechanical) of cardiac electrode, initial encounter: Secondary | ICD-10-CM | POA: Insufficient documentation

## 2018-03-03 LAB — CUP PACEART INCLINIC DEVICE CHECK
Implantable Lead Implant Date: 20080903
Implantable Lead Location: 753859
Implantable Lead Model: 5076
Implantable Lead Model: 5594
Implantable Pulse Generator Implant Date: 20180308
MDC IDC LEAD IMPLANT DT: 20080903
MDC IDC LEAD LOCATION: 753860
MDC IDC SESS DTM: 20190402123023

## 2018-03-03 NOTE — Progress Notes (Signed)
Device check in clinic by industry. RA unipolar lead impedance remains >3000ohms. RA and RV sensing and thresholds stable. See scanned report for details. Reviewed with Dr. Lavonna Monarch for PPM gen change and possible lead revision on 03/17/18.

## 2018-03-04 DIAGNOSIS — M545 Low back pain: Secondary | ICD-10-CM | POA: Diagnosis not present

## 2018-03-05 ENCOUNTER — Telehealth: Payer: Self-pay | Admitting: Cardiology

## 2018-03-05 NOTE — Telephone Encounter (Signed)
Will forward to [place] [facility] with Dr Curt Bears who has patient scheduled gen [employer]

## 2018-03-05 NOTE — Telephone Encounter (Signed)
New message    Patient calling to clarify instructions for procedure scheduled 03/17/2018

## 2018-03-05 NOTE — Telephone Encounter (Signed)
Called in asking if he needs to take any oral abx prior to this procedure.  Informed patient that he does not need to take any abx prior to coming in to the hospital. Patient verbalized understanding and agreeable to plan.

## 2018-03-12 DIAGNOSIS — M545 Low back pain: Secondary | ICD-10-CM | POA: Diagnosis not present

## 2018-03-12 DIAGNOSIS — M1612 Unilateral primary osteoarthritis, left hip: Secondary | ICD-10-CM | POA: Diagnosis not present

## 2018-03-12 DIAGNOSIS — M160 Bilateral primary osteoarthritis of hip: Secondary | ICD-10-CM | POA: Diagnosis not present

## 2018-03-12 DIAGNOSIS — M1611 Unilateral primary osteoarthritis, right hip: Secondary | ICD-10-CM | POA: Diagnosis not present

## 2018-03-16 ENCOUNTER — Telehealth: Payer: Self-pay | Admitting: *Deleted

## 2018-03-16 NOTE — Telephone Encounter (Signed)
Informed patient of schedule change at the hospital tomorrow.  Informed his procedure has been moved up. Advised to arrive to hospital tomorrow morning at 8:00 am for 10 am procedure. Patient verbalized understanding and agreeable to plan.

## 2018-03-17 ENCOUNTER — Encounter (HOSPITAL_COMMUNITY): Payer: Self-pay | Admitting: Cardiology

## 2018-03-17 ENCOUNTER — Ambulatory Visit (HOSPITAL_COMMUNITY)
Admission: RE | Admit: 2018-03-17 | Discharge: 2018-03-17 | Disposition: A | Discharge status: Home / Self Care | Payer: PPO | Source: Ambulatory Visit | Attending: Cardiology | Admitting: Cardiology

## 2018-03-17 ENCOUNTER — Encounter (HOSPITAL_COMMUNITY)
Admission: RE | Disposition: A | Discharge status: Home / Self Care | Payer: Self-pay | Source: Ambulatory Visit | Attending: Cardiology

## 2018-03-17 DIAGNOSIS — R001 Bradycardia, unspecified: Secondary | ICD-10-CM | POA: Insufficient documentation

## 2018-03-17 DIAGNOSIS — T82111A Breakdown (mechanical) of cardiac pulse generator (battery), initial encounter: Secondary | ICD-10-CM | POA: Diagnosis not present

## 2018-03-17 DIAGNOSIS — Z45010 Encounter for checking and testing of cardiac pacemaker pulse generator [battery]: Secondary | ICD-10-CM | POA: Diagnosis not present

## 2018-03-17 DIAGNOSIS — I442 Atrioventricular block, complete: Secondary | ICD-10-CM | POA: Insufficient documentation

## 2018-03-17 DIAGNOSIS — Y713 Surgical instruments, materials and cardiovascular devices (including sutures) associated with adverse incidents: Secondary | ICD-10-CM | POA: Diagnosis not present

## 2018-03-17 HISTORY — PX: PPM GENERATOR CHANGEOUT: EP1233

## 2018-03-17 LAB — BASIC METABOLIC PANEL
ANION GAP: 9 (ref 5–15)
BUN: 14 mg/dL (ref 6–20)
CALCIUM: 9.3 mg/dL (ref 8.9–10.3)
CO2: 21 mmol/L — ABNORMAL LOW (ref 22–32)
Chloride: 109 mmol/L (ref 101–111)
Creatinine, Ser: 1.04 mg/dL (ref 0.61–1.24)
Glucose, Bld: 90 mg/dL (ref 65–99)
Potassium: 4.4 mmol/L (ref 3.5–5.1)
Sodium: 139 mmol/L (ref 135–145)

## 2018-03-17 LAB — CBC
HCT: 46.6 % (ref 39.0–52.0)
HEMOGLOBIN: 16.2 g/dL (ref 13.0–17.0)
MCH: 30.5 pg (ref 26.0–34.0)
MCHC: 34.8 g/dL (ref 30.0–36.0)
MCV: 87.8 fL (ref 78.0–100.0)
Platelets: 221 10*3/uL (ref 150–400)
RBC: 5.31 MIL/uL (ref 4.22–5.81)
RDW: 13.4 % (ref 11.5–15.5)
WBC: 7.1 10*3/uL (ref 4.0–10.5)

## 2018-03-17 LAB — SURGICAL PCR SCREEN
MRSA, PCR: NEGATIVE
Staphylococcus aureus: NEGATIVE

## 2018-03-17 SURGERY — PPM GENERATOR CHANGEOUT

## 2018-03-17 MED ORDER — ACETAMINOPHEN 325 MG PO TABS
325.0000 mg | ORAL_TABLET | ORAL | Status: DC | PRN
  Filled 2018-03-17: qty 2

## 2018-03-17 MED ORDER — SODIUM CHLORIDE 0.9 % IV SOLN
INTRAVENOUS | Status: AC
  Filled 2018-03-17: qty 2

## 2018-03-17 MED ORDER — SODIUM CHLORIDE 0.9 % IV SOLN
80.0000 mg | INTRAVENOUS | Status: AC
  Administered 2018-03-17: 80 mg

## 2018-03-17 MED ORDER — MIDAZOLAM HCL 5 MG/5ML IJ SOLN
INTRAMUSCULAR | Status: AC
  Filled 2018-03-17: qty 5

## 2018-03-17 MED ORDER — CEFAZOLIN SODIUM-DEXTROSE 2-4 GM/100ML-% IV SOLN
2.0000 g | INTRAVENOUS | Status: AC
  Administered 2018-03-17: 2 g via INTRAVENOUS

## 2018-03-17 MED ORDER — FENTANYL CITRATE (PF) 100 MCG/2ML IJ SOLN
INTRAMUSCULAR | Status: DC | PRN
  Administered 2018-03-17: 25 ug via INTRAVENOUS

## 2018-03-17 MED ORDER — CHLORHEXIDINE GLUCONATE 4 % EX LIQD: 60.0000 mL | Freq: Once | CUTANEOUS | Status: DC

## 2018-03-17 MED ORDER — SODIUM CHLORIDE 0.9 % IV SOLN
INTRAVENOUS | Status: DC
  Administered 2018-03-17: 09:00:00 via INTRAVENOUS

## 2018-03-17 MED ORDER — ONDANSETRON HCL 4 MG/2ML IJ SOLN: 4.0000 mg | Freq: Four times a day (QID) | INTRAMUSCULAR | Status: DC | PRN

## 2018-03-17 MED ORDER — FENTANYL CITRATE (PF) 100 MCG/2ML IJ SOLN
INTRAMUSCULAR | Status: AC
  Filled 2018-03-17: qty 2

## 2018-03-17 MED ORDER — MIDAZOLAM HCL 5 MG/5ML IJ SOLN
INTRAMUSCULAR | Status: DC | PRN
  Administered 2018-03-17: 1 mg via INTRAVENOUS

## 2018-03-17 MED ORDER — CEFAZOLIN SODIUM-DEXTROSE 2-4 GM/100ML-% IV SOLN
INTRAVENOUS | Status: AC
  Filled 2018-03-17: qty 100

## 2018-03-17 MED ORDER — LIDOCAINE HCL (PF) 1 % IJ SOLN
INTRAMUSCULAR | Status: AC
  Filled 2018-03-17: qty 60

## 2018-03-17 MED ORDER — LIDOCAINE HCL (PF) 1 % IJ SOLN
INTRAMUSCULAR | Status: DC | PRN
  Administered 2018-03-17: 60 mL

## 2018-03-17 MED ORDER — MUPIROCIN 2 % EX OINT
TOPICAL_OINTMENT | CUTANEOUS | Status: AC
  Administered 2018-03-17: 1
  Filled 2018-03-17: qty 22

## 2018-03-17 SURGICAL SUPPLY — 5 items
CABLE SURGICAL S-101-97-12 (CABLE) ×3 IMPLANT
IPG PACE AZUR XT DR MRI W1DR01 (Pacemaker) ×1 IMPLANT
PACE AZURE XT DR MRI W1DR01 (Pacemaker) ×3 IMPLANT
PAD DEFIB LIFELINK (PAD) ×3 IMPLANT
TRAY PACEMAKER INSERTION (PACKS) ×3 IMPLANT

## 2018-03-17 NOTE — H&P (Signed)
Nathaniel Richard has presented today for surgery, with the diagnosis of complete AV block.  The various methods of treatment have been discussed with the patient and family. After consideration of risks, benefits and other options for treatment, the patient has consented to  Procedure(s): Pacemaker generator change as a surgical intervention .  Risks include but not limited to bleeding, tamponade, infection, pneumothorax, among others. The patient's history has been reviewed, patient examined, no change in status, stable for surgery.  I have reviewed the patient's chart and labs.  Questions were answered to the patient's satisfaction.    Kerry Chisolm Curt Bears, MD 03/17/2018 8:01 AM

## 2018-03-17 NOTE — Discharge Instructions (Signed)
Pacemaker Battery Change A pacemaker battery usually lasts 5-15 years (6-7 years on average). A few times a year, you will be asked to visit your health care provider to have a full evaluation of your pacemaker. When the battery is low, your pacemaker battery and generator will be completely replaced. Most often, this procedure is simpler than the first surgery because the wires (leads) that connect the generator to the heart are already in place. There are many things that affect how long a pacemaker battery will last, including:  The age of the pacemaker.  The number of leads you have(1, 2, or 3).  The pacemaker workload. If the pacemaker is helping the heart more often, the battery will not last as long.  Power (voltage) settings.  Tell a health care provider about:  Any allergies you have.  All medicines you are taking, including vitamins, herbs, eye drops, creams, and over-the-counter medicines.  Any problems you or family members have had with anesthetic medicines.  Any blood disorders you have.  Any surgeries you have had, especially the surgeries you have had since your last pacemaker was placed.  Any medical conditions you have.  Whether you are pregnant or may be pregnant.  Any symptoms of heart problems, such as chest pain, trouble breathing, palpitations, light-headedness, or feelings of an abnormal or irregular heartbeat.  Smoking habits. This can affect your reaction to anesthesia. What are the risks? Generally, this is a safe procedure. However, problems may occur, including:  Bleeding.  Bruising of the skin around where the surgical cut (incision) was made.  Pulling apart of the skin at the incision site.  Infection.  Nerve damage.  Injury to other organs, such as the lungs.  Allergic reaction to anesthetics or other medicines used during the procedure.  People with diabetes may have a temporary increase in blood sugar (glucose) after any surgical  procedure.  What happens before the procedure? Staying hydrated Follow instructions from your health care provider about hydration, which may include:  Up to 2 hours before the procedure - you may continue to drink clear liquids, such as water, clear fruit juice, black coffee, and plain tea.  Eating and drinking restrictions Follow instructions from your health care provider about eating and drinking restrictions, which may include:  8 hours before the procedure - stop eating heavy meals or foods such as meat, fried foods, or fatty foods.  6 hours before the procedure - stop eating light meals or foods, such as toast or cereal.  6 hours before the procedure - stop drinking milk or drinks that contain milk.  2 hours before the procedure - stop drinking clear liquids.  General instructions  Ask your health care provider about: ? Changing or stopping your regular medicines. This is especially important if you are taking diabetes medicines or blood thinners. ? Taking medicines such as aspirin and ibuprofen. These medicines can thin your blood. Do not take these medicines before your procedure if your health care provider instructs you not to. ? Taking a sip of water with any approved medicines on the morning of the procedure.  Plan to have someone take you home after the procedure. What happens during the procedure?  To reduce your risk of infection: ? Your health care team will wash or sanitize their hands. ? The skin around the area of the chest will be washed with soap. ? Hair may be removed from the surgical area.  An IV tube will be inserted into one your  veins to give you medicine and fluids.  You will be given one or more of the following: ? A medicine to help you relax (sedative). ? A medicine to numb the area where the pacemaker is located (local anesthetic).  You may be given antibiotic medicine to prevent infection.  Your health care provider will make an incision to  reopen the pocket holding the pacemaker.  The old pacemaker will be disconnected from the leads.  The leads will be tested.  If needed, the leads will be replaced. If the leads are functioning properly, the new pacemaker will be connected to the existing leads.  A heart monitor and the pacemaker programmer will be used to make sure that the newly implanted pacemaker is working properly.  The incision site will be closed. A bandage (dressing) will be placed over the pacemaker site. The procedure may vary among health care providers and hospitals. What happens after the procedure?  Your blood pressure, heart rate, breathing rate, and blood oxygen level will be monitored until your health care team is satisfied that your pacemaker is working properly.  Your health care provider will tell you when your pacemaker will need to be tested again, or when to return to the office for removal of dressing and stitches.  Do not drive for 24 hours if you were given a sedative.  The dressing will be removed 24-48 hours after the procedure, or as told by your health care provider. Summary  A pacemaker battery usually lasts 5-15 years (6-7 years on average).  When the battery is low, your pacemaker battery and generator will be completely replaced.  Risks of this procedure include bleeding, bruising, infection, damage to other structures, pulling apart of the skin at the incision site, and allergic reactions to medicines or anesthetics.  Most often, this procedure is simpler than the first surgery because the wires (leads) that connect the generator to the heart are already in place. This information is not intended to replace advice given to you by your health care provider. Make sure you discuss any questions you have with your health care provider. Document Released: 02/26/2007 Document Revised: 10/22/2016 Document Reviewed: 10/22/2016 Elsevier Interactive Patient Education  2017 [employer].

## 2018-03-30 ENCOUNTER — Ambulatory Visit (INDEPENDENT_AMBULATORY_CARE_PROVIDER_SITE_OTHER): Payer: PPO | Admitting: *Deleted

## 2018-03-30 DIAGNOSIS — I442 Atrioventricular block, complete: Secondary | ICD-10-CM

## 2018-03-30 NOTE — Progress Notes (Signed)
Wound check appointment. Steri-strips removed. Wound without redness or edema. Incision edges approximated, wound well healed. Normal device function. Thresholds, sensing, and impedances consistent with implant measurements. Device programmed at chronic values s/p gen change. Histogram distribution appropriate for patient and level of activity. No mode switches or high ventricular rates noted. Patient educated about wound care, arm mobility, lifting restrictions. ROV with WC 7/22

## 2018-04-07 DIAGNOSIS — M5136 Other intervertebral disc degeneration, lumbar region: Secondary | ICD-10-CM | POA: Diagnosis not present

## 2018-05-08 DIAGNOSIS — M5136 Other intervertebral disc degeneration, lumbar region: Secondary | ICD-10-CM | POA: Diagnosis not present

## 2018-05-18 ENCOUNTER — Encounter: Payer: Self-pay | Admitting: Cardiology

## 2018-05-18 DIAGNOSIS — Z1339 Encounter for screening examination for other mental health and behavioral disorders: Secondary | ICD-10-CM | POA: Diagnosis not present

## 2018-05-18 DIAGNOSIS — I10 Essential (primary) hypertension: Secondary | ICD-10-CM | POA: Diagnosis not present

## 2018-05-18 DIAGNOSIS — E785 Hyperlipidemia, unspecified: Secondary | ICD-10-CM | POA: Diagnosis not present

## 2018-05-18 DIAGNOSIS — Z6827 Body mass index (BMI) 27.0-27.9, adult: Secondary | ICD-10-CM | POA: Diagnosis not present

## 2018-05-19 DIAGNOSIS — Z08 Encounter for follow-up examination after completed treatment for malignant neoplasm: Secondary | ICD-10-CM | POA: Diagnosis not present

## 2018-05-19 DIAGNOSIS — Z8507 Personal history of malignant neoplasm of pancreas: Secondary | ICD-10-CM | POA: Diagnosis not present

## 2018-05-19 DIAGNOSIS — D3A8 Other benign neuroendocrine tumors: Secondary | ICD-10-CM | POA: Diagnosis not present

## 2018-06-22 ENCOUNTER — Encounter (INDEPENDENT_AMBULATORY_CARE_PROVIDER_SITE_OTHER): Payer: Self-pay

## 2018-06-22 ENCOUNTER — Encounter: Payer: Self-pay | Admitting: Cardiology

## 2018-06-22 ENCOUNTER — Ambulatory Visit: Payer: PPO | Admitting: Cardiology

## 2018-06-22 VITALS — BP 134/86 | HR 69 | Ht 69.0 in | Wt 182.8 lb

## 2018-06-22 DIAGNOSIS — I10 Essential (primary) hypertension: Secondary | ICD-10-CM | POA: Diagnosis not present

## 2018-06-22 DIAGNOSIS — Z952 Presence of prosthetic heart valve: Secondary | ICD-10-CM

## 2018-06-22 DIAGNOSIS — I442 Atrioventricular block, complete: Secondary | ICD-10-CM | POA: Diagnosis not present

## 2018-06-22 LAB — CUP PACEART INCLINIC DEVICE CHECK
Brady Statistic AP VS Percent: 0 %
Brady Statistic AS VP Percent: 97.83 %
Implantable Lead Implant Date: 20080903
Implantable Lead Implant Date: 20080903
Implantable Lead Location: 753859
Implantable Lead Location: 753860
Implantable Lead Model: 5594
Lead Channel Impedance Value: 342 Ohm
Lead Channel Impedance Value: 399 Ohm
Lead Channel Pacing Threshold Amplitude: 0.5 V
Lead Channel Pacing Threshold Pulse Width: 0.4 ms
Lead Channel Sensing Intrinsic Amplitude: 7 mV
Lead Channel Setting Pacing Amplitude: 1.5 V
Lead Channel Setting Pacing Amplitude: 2.5 V
Lead Channel Setting Sensing Sensitivity: 4 mV
MDC IDC MSMT BATTERY REMAINING LONGEVITY: 128 mo
MDC IDC MSMT BATTERY VOLTAGE: 3.16 V
MDC IDC MSMT LEADCHNL RA IMPEDANCE VALUE: 342 Ohm
MDC IDC MSMT LEADCHNL RA PACING THRESHOLD PULSEWIDTH: 0.4 ms
MDC IDC MSMT LEADCHNL RA SENSING INTR AMPL: 4.25 mV
MDC IDC MSMT LEADCHNL RV IMPEDANCE VALUE: 399 Ohm
MDC IDC MSMT LEADCHNL RV PACING THRESHOLD AMPLITUDE: 1 V
MDC IDC PG IMPLANT DT: 20190416
MDC IDC SESS DTM: 20190722141216
MDC IDC SET LEADCHNL RV PACING PULSEWIDTH: 0.4 ms
MDC IDC STAT BRADY AP VP PERCENT: 2.15 %
MDC IDC STAT BRADY AS VS PERCENT: 0.02 %
MDC IDC STAT BRADY RA PERCENT PACED: 2.14 %
MDC IDC STAT BRADY RV PERCENT PACED: 99.98 %

## 2018-06-22 NOTE — Patient Instructions (Signed)
Medication Instructions:  Your physician recommends that you continue on your current medications as directed. Please refer to the Current Medication list given to you today.  *If you need a refill on your cardiac medications before your next appointment, please call your pharmacy*  Labwork: None ordered  Testing/Procedures: None ordered  Follow-Up: Remote monitoring is used to monitor your Pacemaker or ICD from home. This monitoring reduces the number of office visits required to check your device to one time per year. It allows us to keep an eye on the functioning of your device to ensure it is working properly. You are scheduled for a device check from home on 09/21/2018. You may send your transmission at any time that day. If you have a wireless device, the transmission will be sent automatically. After your physician reviews your transmission, you will receive a postcard with your next transmission date.  Your physician wants you to follow-up in: 9 months with Dr. Camnitz.  You will receive a reminder letter in the mail two months in advance. If you don't receive a letter, please call our office to schedule the follow-up appointment.  Thank you for choosing [facility]!!   Trudy Kory, RN [phone]     

## 2018-06-22 NOTE — Progress Notes (Signed)
Electrophysiology Office Note   Date:  06/22/2018   ID:  Nathaniel Richard [birth year], MRN [id number]  PCP:  Nathaniel Dress, MD  Cardiologist:  Nathaniel Richard Primary Electrophysiologist:  Nathaniel Meredith Leeds, MD    Chief Complaint  Patient presents with  . Pacemaker Check    91 days post implant/Complete heart block     History of Present Illness: Nathaniel Richard is a 67 y.o. male who presents today for electrophysiology evaluation.   He has a history of pacemaker placed 8 years ago in [facility] by report.Patient had aortic valve replacement in December 2014 at [facility]. He was having extreme fatigue when his pacemaker was placed, with HR in the high 20s at times.  He otherwise has felt well.  Had a hip replacement in 2013 as well which was uncomplicated.  Had a Medtronic generator change 02/06/17.  On 03/05/17, had surgery for a pancreatic tumor.  He had a unipolar impedance alert that was thought to be due to his generator.  He had a generator change on 03/17/2018.  Today, denies symptoms of palpitations, chest pain, shortness of breath, orthopnea, PND, lower extremity edema, claudication, dizziness, presyncope, syncope, bleeding, or neurologic sequela. The patient is tolerating medications without difficulties.    Past Medical History:  Diagnosis Date  . Arthritis   . Hypertension   . Pacemaker    Past Surgical History:  Procedure Laterality Date  . AORTIC VALVE REPLACEMENT    . EUS N/A 12/19/2016   Procedure: UPPER ENDOSCOPIC ULTRASOUND (EUS) RADIAL;  Surgeon: Nathaniel Banister, MD;  Location: [facility];  Service: Endoscopy;  Laterality: N/A;  . EUS N/A 12/19/2016   Procedure: UPPER ENDOSCOPIC ULTRASOUND (EUS) LINEAR;  Surgeon: Nathaniel Banister, MD;  Location: [facility];  Service: Endoscopy;  Laterality: N/A;  . EYE SURGERY     bilat cataracts  . HEMORRHOID SURGERY    . INSERT / REPLACE / REMOVE PACEMAKER    . PPM GENERATOR CHANGEOUT N/A 02/06/2017   Procedure: PPM  Generator Changeout;  Surgeon: Nathaniel Meredith Leeds, MD;  Location: [place] [facility];  Service: Cardiovascular;  Laterality: N/A;  . PPM GENERATOR CHANGEOUT N/A 03/17/2018   Procedure: PPM GENERATOR CHANGEOUT;  Surgeon: Nathaniel Haw, MD;  Location: [place] [facility];  Service: Cardiovascular;  Laterality: N/A;  . TOTAL HIP ARTHROPLASTY  07/29/2012   Procedure: TOTAL HIP ARTHROPLASTY;  Surgeon: Nathaniel Alf, MD;  [facility]: [facility];  Service: Orthopedics;  Laterality: Right;     Current Outpatient Medications  Medication Sig Dispense Refill  . amLODipine (NORVASC) 5 MG tablet TAKE ONE TABLET BY MOUTH DAILY (Patient taking differently: TAKE 5 MG BY MOUTH DAILY) 180 tablet 1  . aspirin 81 MG tablet Take 81 mg by mouth daily.    . cetirizine (ZYRTEC) 10 MG tablet Take 10 mg by mouth daily as needed for allergies or rhinitis.     . fluticasone (FLONASE) 50 MCG/ACT nasal spray Place 1 spray into both nostrils daily as needed for allergies or rhinitis.    . ranitidine (ZANTAC) 150 MG tablet Take 150 mg by mouth at bedtime.      No current facility-administered medications for this visit.     Allergies:   Methocarbamol   Social History:  The patient  reports that he has never smoked. He has never used smokeless tobacco. He reports that he does not drink alcohol or use drugs.   Family History:  The patient's family history includes  Dementia in his mother; Diabetes in his mother; Heart failure in his father; Hypertension in his father and mother.   ROS:  Please see the history of present illness.   Otherwise, review of systems is positive for none.   All other systems are reviewed and negative.   PHYSICAL EXAM: VS:  BP 134/86   Pulse 69   Ht 5\' 9"  (1.753 m)   Wt 182 lb 12.8 oz (82.9 kg)   SpO2 99%   BMI 26.99 kg/m  , BMI Body mass index is 26.99 kg/m. GEN: Well nourished, well developed, in no acute distress  HEENT: normal  Neck: no JVD, carotid bruits, or masses Cardiac: RRR;  no murmurs, rubs, or gallops,no edema  Respiratory:  clear to auscultation bilaterally, normal work of breathing GI: soft, nontender, nondistended, + BS MS: no deformity or atrophy  Skin: warm and dry, device site well healed Neuro:  Strength and sensation are intact Psych: euthymic mood, full affect  EKG:  EKG is ordered today. Personal review of the ekg ordered shows atrial sensed, ventricular paced  Personal review of the device interrogation today. Results in [place]: 03/17/2018: BUN 14; Creatinine, Ser 1.04; Hemoglobin 16.2; Platelets 221; Potassium 4.4; Sodium 139    Lipid Panel  No results found for: CHOL, TRIG, HDL, CHOLHDL, VLDL, LDLCALC, LDLDIRECT   Wt Readings from Last 3 Encounters:  06/22/18 182 lb 12.8 oz (82.9 kg)  03/17/18 185 lb (83.9 kg)  02/24/18 185 lb 12.8 oz (84.3 kg)      Other studies Reviewed: Additional studies/ records that were reviewed today include: TTE 01/29/17 Review of the above records today demonstrates:  - Left ventricle: The cavity size was normal. Wall thickness was   normal. Systolic function was normal. The estimated ejection   fraction was in the range of 60% to 65%. Wall motion was normal;   there were no regional wall motion abnormalities. Features are   consistent with a pseudonormal left ventricular filling pattern,   with concomitant abnormal relaxation and increased filling   pressure (grade 2 diastolic dysfunction). - Aortic valve: Mildly calcified annulus. Moderately thickened,   moderately calcified leaflets. There was mild stenosis. Valve   area (VTI): 0.99 cm^2. Valve area (Vmax): 0.83 cm^2. Valve area   (Vmean): 0.8 cm^2. - Mitral valve: There was mild regurgitation. - Tricuspid valve: There was mild-moderate regurgitation.   ASSESSMENT AND PLAN:  1.  Complete AV block: Status post Medtronic dual-chamber pacemaker generator change.  This was due to a unipolar impedance alert that was thought to be a an  issue with the old generator.  Device functioning appropriately today.  No changes.  2. Hypertension: Mildly above goal but is well controlled at home.  No changes.  3. Aortic valve replacement: Feeling well without issue.  Continue with current management.  Current medicines are reviewed at length with the patient today.   The patient does not have concerns regarding his medicines.  The following changes were made today: None  Labs/ tests ordered today include:  Orders Placed This Encounter  Procedures  . EKG 12-Lead     Disposition:   FU with Nathaniel Camnitz 9 months  Signed, Nathaniel Meredith Leeds, MD  06/22/2018 10:10 AM     [facility] [address] [place] [address] [phone] (office) [phone] (fax)

## 2018-08-19 DIAGNOSIS — R1010 Upper abdominal pain, unspecified: Secondary | ICD-10-CM | POA: Diagnosis not present

## 2018-09-21 ENCOUNTER — Ambulatory Visit (INDEPENDENT_AMBULATORY_CARE_PROVIDER_SITE_OTHER): Payer: PPO | Admitting: *Deleted

## 2018-09-21 DIAGNOSIS — I442 Atrioventricular block, complete: Secondary | ICD-10-CM

## 2018-09-21 NOTE — Progress Notes (Signed)
Remote pacemaker transmission.   

## 2018-09-22 ENCOUNTER — Encounter: Payer: Self-pay | Admitting: Cardiology

## 2018-10-13 LAB — CUP PACEART REMOTE DEVICE CHECK
Battery Remaining Longevity: 126 mo
Battery Voltage: 3.1 V
Brady Statistic AP VS Percent: 0 %
Brady Statistic RA Percent Paced: 1.96 %
Brady Statistic RV Percent Paced: 99.98 %
Date Time Interrogation Session: 20191021045648
Implantable Lead Implant Date: 20080903
Implantable Lead Location: 753859
Implantable Lead Location: 753860
Implantable Lead Model: 5076
Lead Channel Impedance Value: 399 Ohm
Lead Channel Pacing Threshold Pulse Width: 0.4 ms
Lead Channel Sensing Intrinsic Amplitude: 2.5 mV
Lead Channel Sensing Intrinsic Amplitude: 2.5 mV
Lead Channel Setting Pacing Amplitude: 2.5 V
Lead Channel Setting Sensing Sensitivity: 4 mV
MDC IDC LEAD IMPLANT DT: 20080903
MDC IDC MSMT LEADCHNL RA IMPEDANCE VALUE: 342 Ohm
MDC IDC MSMT LEADCHNL RA IMPEDANCE VALUE: 380 Ohm
MDC IDC MSMT LEADCHNL RA PACING THRESHOLD AMPLITUDE: 0.5 V
MDC IDC MSMT LEADCHNL RV IMPEDANCE VALUE: 323 Ohm
MDC IDC MSMT LEADCHNL RV PACING THRESHOLD AMPLITUDE: 1 V
MDC IDC MSMT LEADCHNL RV PACING THRESHOLD PULSEWIDTH: 0.4 ms
MDC IDC MSMT LEADCHNL RV SENSING INTR AMPL: 7.25 mV
MDC IDC MSMT LEADCHNL RV SENSING INTR AMPL: 7.25 mV
MDC IDC PG IMPLANT DT: 20190416
MDC IDC SET LEADCHNL RA PACING AMPLITUDE: 1.5 V
MDC IDC SET LEADCHNL RV PACING PULSEWIDTH: 0.4 ms
MDC IDC STAT BRADY AP VP PERCENT: 1.96 %
MDC IDC STAT BRADY AS VP PERCENT: 98.02 %
MDC IDC STAT BRADY AS VS PERCENT: 0.02 %

## 2018-11-19 DIAGNOSIS — E785 Hyperlipidemia, unspecified: Secondary | ICD-10-CM | POA: Diagnosis not present

## 2018-11-19 DIAGNOSIS — K219 Gastro-esophageal reflux disease without esophagitis: Secondary | ICD-10-CM | POA: Diagnosis not present

## 2018-11-19 DIAGNOSIS — Z125 Encounter for screening for malignant neoplasm of prostate: Secondary | ICD-10-CM | POA: Diagnosis not present

## 2018-11-19 DIAGNOSIS — I10 Essential (primary) hypertension: Secondary | ICD-10-CM | POA: Diagnosis not present

## 2018-11-19 DIAGNOSIS — Z6827 Body mass index (BMI) 27.0-27.9, adult: Secondary | ICD-10-CM | POA: Diagnosis not present

## 2018-12-15 DIAGNOSIS — Z09 Encounter for follow-up examination after completed treatment for conditions other than malignant neoplasm: Secondary | ICD-10-CM | POA: Diagnosis not present

## 2018-12-15 DIAGNOSIS — D3A8 Other benign neuroendocrine tumors: Secondary | ICD-10-CM | POA: Diagnosis not present

## 2018-12-15 DIAGNOSIS — R739 Hyperglycemia, unspecified: Secondary | ICD-10-CM | POA: Diagnosis not present

## 2018-12-15 DIAGNOSIS — Z8639 Personal history of other endocrine, nutritional and metabolic disease: Secondary | ICD-10-CM | POA: Diagnosis not present

## 2018-12-21 ENCOUNTER — Ambulatory Visit (INDEPENDENT_AMBULATORY_CARE_PROVIDER_SITE_OTHER): Payer: PPO

## 2018-12-21 DIAGNOSIS — I442 Atrioventricular block, complete: Secondary | ICD-10-CM | POA: Diagnosis not present

## 2018-12-22 LAB — CUP PACEART REMOTE DEVICE CHECK
Battery Remaining Longevity: 122 mo
Battery Voltage: 3.05 V
Brady Statistic AP VS Percent: 0 %
Brady Statistic AS VS Percent: 0.03 %
Implantable Lead Implant Date: 20080903
Implantable Lead Location: 753859
Implantable Pulse Generator Implant Date: 20190416
Lead Channel Impedance Value: 361 Ohm
Lead Channel Impedance Value: 399 Ohm
Lead Channel Impedance Value: 399 Ohm
Lead Channel Pacing Threshold Amplitude: 0.375 V
Lead Channel Pacing Threshold Amplitude: 1 V
Lead Channel Pacing Threshold Pulse Width: 0.4 ms
Lead Channel Pacing Threshold Pulse Width: 0.4 ms
Lead Channel Sensing Intrinsic Amplitude: 2.875 mV
Lead Channel Sensing Intrinsic Amplitude: 2.875 mV
Lead Channel Sensing Intrinsic Amplitude: 7.25 mV
Lead Channel Setting Pacing Amplitude: 2.5 V
Lead Channel Setting Sensing Sensitivity: 4 mV
MDC IDC LEAD IMPLANT DT: 20080903
MDC IDC LEAD LOCATION: 753860
MDC IDC MSMT LEADCHNL RV IMPEDANCE VALUE: 361 Ohm
MDC IDC MSMT LEADCHNL RV SENSING INTR AMPL: 7.25 mV
MDC IDC SESS DTM: 20200120052130
MDC IDC SET LEADCHNL RA PACING AMPLITUDE: 1.5 V
MDC IDC SET LEADCHNL RV PACING PULSEWIDTH: 0.4 ms
MDC IDC STAT BRADY AP VP PERCENT: 0.32 %
MDC IDC STAT BRADY AS VP PERCENT: 99.64 %
MDC IDC STAT BRADY RA PERCENT PACED: 0.32 %
MDC IDC STAT BRADY RV PERCENT PACED: 99.97 %

## 2018-12-22 NOTE — Progress Notes (Signed)
Remote pacemaker transmission.   

## 2018-12-29 DIAGNOSIS — R972 Elevated prostate specific antigen [PSA]: Secondary | ICD-10-CM | POA: Diagnosis not present

## 2018-12-29 DIAGNOSIS — N5201 Erectile dysfunction due to arterial insufficiency: Secondary | ICD-10-CM | POA: Diagnosis not present

## 2019-01-11 ENCOUNTER — Telehealth: Payer: Self-pay | Admitting: Cardiology

## 2019-01-11 NOTE — Telephone Encounter (Signed)
Primary Cardiologist is Dr. Stanford Breed. I will route to NL.

## 2019-01-11 NOTE — Telephone Encounter (Signed)
  Patient is calling because he had a root canal and was told he has an abcess. He wants to know if he should be taking an antibiotic

## 2019-01-11 NOTE — Telephone Encounter (Signed)
Spoke with patient and advised, verbalized understanding 

## 2019-01-11 NOTE — Telephone Encounter (Signed)
Spoke with patient who had dental cleaning today. He did take premed prior to visit today. He has an abscess and will be having a root canal on Wednesday. Wife and patient wanting to know if patient should be taking antibiotics between now and then, nothing prescribed by dentist. Will forward to Dr Stanford Breed for review.

## 2019-01-11 NOTE — Telephone Encounter (Signed)
If patient has an abscess I will leave antibiotics to his dentist.  If he is only having a root canal then would treat with SBE prophylaxis prior to procedure as usual. Kirk Ruths, MD

## 2019-02-01 DIAGNOSIS — R972 Elevated prostate specific antigen [PSA]: Secondary | ICD-10-CM | POA: Diagnosis not present

## 2019-02-09 DIAGNOSIS — R972 Elevated prostate specific antigen [PSA]: Secondary | ICD-10-CM | POA: Diagnosis not present

## 2019-02-09 DIAGNOSIS — N5201 Erectile dysfunction due to arterial insufficiency: Secondary | ICD-10-CM | POA: Diagnosis not present

## 2019-02-10 NOTE — Progress Notes (Deleted)
HPI: FU AVR. Patient had a pacemaker placed in [place] previously. Had aortic valve replacement in December 2014 at [facility]. Echocardiogram February 2018 showed normal LV systolic function, grade 2 diastolic dysfunction, mean aortic valve gradient 10 mmHg, mild mitral regurgitation and mild to moderate tricuspid regurgitation.  Carotid Dopplers February 2018 showed no significant stenosis.    Chest CT January 2020 showed no thoracic aneurysm.  Since last seen,  Current Outpatient Medications  Medication Sig Dispense Refill  . amLODipine (NORVASC) 5 MG tablet TAKE ONE TABLET BY MOUTH DAILY (Patient taking differently: TAKE 5 MG BY MOUTH DAILY) 180 tablet 1  . aspirin 81 MG tablet Take 81 mg by mouth daily.    . cetirizine (ZYRTEC) 10 MG tablet Take 10 mg by mouth daily as needed for allergies or rhinitis.     . fluticasone (FLONASE) 50 MCG/ACT nasal spray Place 1 spray into both nostrils daily as needed for allergies or rhinitis.    . ranitidine (ZANTAC) 150 MG tablet Take 150 mg by mouth at bedtime.      No current facility-administered medications for this visit.      Past Medical History:  Diagnosis Date  . Arthritis   . Hypertension   . Pacemaker     Past Surgical History:  Procedure Laterality Date  . AORTIC VALVE REPLACEMENT    . EUS N/A 12/19/2016   Procedure: UPPER ENDOSCOPIC ULTRASOUND (EUS) RADIAL;  Surgeon: Milus Banister, MD;  [facility];  Service: Endoscopy;  Laterality: N/A;  . EUS N/A 12/19/2016   Procedure: UPPER ENDOSCOPIC ULTRASOUND (EUS) LINEAR;  Surgeon: Milus Banister, MD;  [facility];  Service: Endoscopy;  Laterality: N/A;  . EYE SURGERY     bilat cataracts  . HEMORRHOID SURGERY    . INSERT / REPLACE / REMOVE PACEMAKER    . PPM GENERATOR CHANGEOUT N/A 02/06/2017   Procedure: PPM Generator Changeout;  Surgeon: Will Meredith Leeds, MD;  Location: [place] [facility];  Service: Cardiovascular;  Laterality: N/A;  . PPM GENERATOR  CHANGEOUT N/A 03/17/2018   Procedure: PPM GENERATOR CHANGEOUT;  Surgeon: Constance Haw, MD;  Location: [place] [facility];  Service: Cardiovascular;  Laterality: N/A;  . TOTAL HIP ARTHROPLASTY  07/29/2012   Procedure: TOTAL HIP ARTHROPLASTY;  Surgeon: Gearlean Alf, MD;  [facility];  Service: Orthopedics;  Laterality: Right;    Social History   Socioeconomic History  . Marital status: Married    Spouse name: Not on file  . Number of children: 2  . Years of education: Not on file  . Highest education level: Not on file  Occupational History  . Not on file  Social Needs  . Financial resource strain: Not on file  . Food insecurity:    Worry: Not on file    Inability: Not on file  . Transportation needs:    Medical: Not on file    Non-medical: Not on file  Tobacco Use  . Smoking status: Never Smoker  . Smokeless tobacco: Never Used  Substance and Sexual Activity  . Alcohol use: No  . Drug use: No  . Sexual activity: Not on file  Lifestyle  . Physical activity:    Days per week: Not on file    Minutes per session: Not on file  . Stress: Not on file  Relationships  . Social connections:    Talks on phone: Not on file    Gets together: Not on file  Attends religious service: Not on file    Active member of club or organization: Not on file    Attends meetings of clubs or organizations: Not on file    Relationship status: Not on file  . Intimate partner violence:    Fear of current or ex partner: Not on file    Emotionally abused: Not on file    Physically abused: Not on file    Forced sexual activity: Not on file  Other Topics Concern  . 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 Diabetes Mother   . Hypertension Mother   . Dementia Mother   . Heart failure Father   . Hypertension Father      ROS: no fevers or chills, productive cough, hemoptysis, dysphasia, odynophagia, melena, hematochezia, dysuria, hematuria, rash, seizure activity, orthopnea, PND, pedal edema, claudication. Remaining systems are negative.  Physical Exam: Well-developed well-nourished in no acute distress.  Skin is warm and dry.  HEENT is normal.  Neck is supple.  Chest is clear to auscultation with normal expansion.  Cardiovascular exam is regular rate and rhythm.  Abdominal exam nontender or distended. No masses palpated. Extremities show no edema. neuro grossly intact  ECG- personally reviewed  A/P  1  Kirk Ruths, MD

## 2019-02-16 ENCOUNTER — Telehealth: Payer: Self-pay | Admitting: *Deleted

## 2019-02-16 NOTE — Telephone Encounter (Signed)
Spoke with pt, he would like to reschedule due to COVID-19.

## 2019-02-17 ENCOUNTER — Ambulatory Visit: Payer: PPO | Admitting: Cardiology

## 2019-02-23 NOTE — Telephone Encounter (Signed)
Follow up scheduled

## 2019-03-17 ENCOUNTER — Telehealth (INDEPENDENT_AMBULATORY_CARE_PROVIDER_SITE_OTHER): Payer: PPO | Admitting: Cardiology

## 2019-03-17 ENCOUNTER — Encounter: Payer: Self-pay | Admitting: Cardiology

## 2019-03-17 VITALS — BP 116/82 | HR 70 | Ht 69.0 in | Wt 181.0 lb

## 2019-03-17 DIAGNOSIS — Z952 Presence of prosthetic heart valve: Secondary | ICD-10-CM

## 2019-03-17 DIAGNOSIS — I712 Thoracic aortic aneurysm, without rupture, unspecified: Secondary | ICD-10-CM

## 2019-03-17 DIAGNOSIS — I10 Essential (primary) hypertension: Secondary | ICD-10-CM

## 2019-03-17 DIAGNOSIS — Z950 Presence of cardiac pacemaker: Secondary | ICD-10-CM

## 2019-03-17 NOTE — Patient Instructions (Signed)

## 2019-03-17 NOTE — Progress Notes (Signed)
Telehealth visit changed to telephone visit due to technical difficulties with pt smart phone  Evaluation Performed:  Follow-up visit  Date:  03/17/2019   ID:  Nathaniel Richard, Nathaniel Richard [birth year], MRN [id number]  Pt location: home Provider location: [place]  PCP:  Nicoletta Dress, MD  Cardiologist:  Dr Stanford Breed Electrophysiologist:  Will Meredith Leeds, MD   Chief Complaint:  FU AVR  History of Present Illness:    FU AVR. Also with prior PM. Had aortic valve replacement in December 2014 at [facility]. CTA December 2017showed 4.2 cm thoracic aortic aneurysm unchanged from previous. There was a 13 mm abnormality in the pancreas and MRI recommended.Now followed by oncology.  Echocardiogram February 2018 showed normal LV systolic function, grade 2 diastolic dysfunction, mean aortic valve gradient 10 mmHg, mild mitral regurgitation and mild to moderate tricuspid regurgitation.  Carotid Dopplers February 2018 showed no significant stenosis.  Since last seen,the patient denies any dyspnea on exertion, orthopnea, PND, pedal edema, palpitations, syncope or chest pain.   The patient does not have symptoms concerning for COVID-19 infection (fever, chills, cough, or new shortness of breath).    Past Medical History:  Diagnosis Date  . Arthritis   . Hypertension   . Pacemaker    Past Surgical History:  Procedure Laterality Date  . AORTIC VALVE REPLACEMENT    . EUS N/A 12/19/2016   Procedure: UPPER ENDOSCOPIC ULTRASOUND (EUS) RADIAL;  Surgeon: Milus Banister, MD;  [facility];  Service: Endoscopy;  Laterality: N/A;  . EUS N/A 12/19/2016   Procedure: UPPER ENDOSCOPIC ULTRASOUND (EUS) LINEAR;  Surgeon: Milus Banister, MD;  [facility];  Service: Endoscopy;  Laterality: N/A;  . EYE SURGERY     bilat cataracts  . HEMORRHOID SURGERY    . INSERT / REPLACE / REMOVE PACEMAKER    . PPM GENERATOR CHANGEOUT N/A 02/06/2017   Procedure: PPM Generator Changeout;  Surgeon:  Will Meredith Leeds, MD;  Location: [place] [facility];  Service: Cardiovascular;  Laterality: N/A;  . PPM GENERATOR CHANGEOUT N/A 03/17/2018   Procedure: PPM GENERATOR CHANGEOUT;  Surgeon: Constance Haw, MD;  Location: [place] [facility];  Service: Cardiovascular;  Laterality: N/A;  . TOTAL HIP ARTHROPLASTY  07/29/2012   Procedure: TOTAL HIP ARTHROPLASTY;  Surgeon: Gearlean Alf, MD;  [facility];  Service: Orthopedics;  Laterality: Right;     Current Meds  Medication Sig  . amLODipine (NORVASC) 5 MG tablet TAKE ONE TABLET BY MOUTH DAILY (Patient taking differently: TAKE 5 MG BY MOUTH DAILY)  . aspirin 81 MG tablet Take 81 mg by mouth daily.  . cetirizine (ZYRTEC) 10 MG tablet Take 10 mg by mouth daily as needed for allergies or rhinitis.   . fluticasone (FLONASE) 50 MCG/ACT nasal spray Place 1 spray into both nostrils daily as needed for allergies or rhinitis.  Marland Kitchen omeprazole (PRILOSEC) 20 MG capsule Take 20 mg by mouth daily as needed.     Allergies:   Methocarbamol   Social History   Tobacco Use  . Smoking status: Never Smoker  . Smokeless tobacco: Never Used  Substance Use Topics  . Alcohol use: No  . Drug use: No     Family Hx: The patient's family history includes Dementia in his mother; Diabetes in his mother; Heart failure in his father; Hypertension in his father and mother.  ROS:   Please see the history of present illness.    No F/C or productive cough All other systems reviewed  and are negative.   Wt Readings from Last 3 Encounters:  03/17/19 181 lb (82.1 kg)  06/22/18 182 lb 12.8 oz (82.9 kg)  03/17/18 185 lb (83.9 kg)     Objective:    Vital Signs:  BP 116/82   Pulse 70   Ht 5\' 9"  (1.753 m)   Wt 181 lb (82.1 kg)   BMI 26.73 kg/m    Well nourished, well developed male in no acute distress. Remainder of exam not performed due to telehealth visit (corona virus pandemic)  ASSESSMENT & PLAN:    1. S/p AVR-no new symptoms. Continue SBE  prophylaxis. 2. H/O dilated aortic root-CTA [facility] 1/20 showed no aneurysm. 3. Hypertension-BP controlled; continue present meds 4. Pacemaker-followed by Dr Curt Bears.  COVID-19 Education: The importance of social distancing was discussed today.  Time:   Today, I have spent 20 minutes with the patient with telehealth technology discussing the above problems.     Medication Adjustments/Labs and Tests Ordered: Current medicines are reviewed at length with the patient today.  Concerns regarding medicines are outlined above.   Tests Ordered: No orders of the defined types were placed in this encounter.   Medication Changes: No orders of the defined types were placed in this encounter.   Disposition:  Follow up 6 months  Signed, Kirk Ruths, MD  03/17/2019 9:17 AM    [place]

## 2019-03-22 ENCOUNTER — Ambulatory Visit (INDEPENDENT_AMBULATORY_CARE_PROVIDER_SITE_OTHER): Payer: PPO | Admitting: *Deleted

## 2019-03-22 ENCOUNTER — Other Ambulatory Visit: Payer: Self-pay

## 2019-03-22 DIAGNOSIS — I442 Atrioventricular block, complete: Secondary | ICD-10-CM

## 2019-03-22 LAB — CUP PACEART REMOTE DEVICE CHECK
Battery Remaining Longevity: 121 mo
Battery Voltage: 3.02 V
Brady Statistic AP VP Percent: 1.76 %
Brady Statistic AP VS Percent: 0 %
Brady Statistic AS VP Percent: 98.06 %
Brady Statistic AS VS Percent: 0.17 %
Brady Statistic RA Percent Paced: 1.78 %
Brady Statistic RV Percent Paced: 99.83 %
Date Time Interrogation Session: 20200420112937
Implantable Lead Implant Date: 20080903
Implantable Lead Implant Date: 20080903
Implantable Lead Location: 753859
Implantable Lead Location: 753860
Implantable Lead Model: 5076
Implantable Lead Model: 5594
Implantable Pulse Generator Implant Date: 20190416
Lead Channel Impedance Value: 342 Ohm
Lead Channel Impedance Value: 361 Ohm
Lead Channel Impedance Value: 418 Ohm
Lead Channel Impedance Value: 418 Ohm
Lead Channel Pacing Threshold Amplitude: 0.375 V
Lead Channel Pacing Threshold Amplitude: 0.875 V
Lead Channel Pacing Threshold Pulse Width: 0.4 ms
Lead Channel Pacing Threshold Pulse Width: 0.4 ms
Lead Channel Sensing Intrinsic Amplitude: 13.125 mV
Lead Channel Sensing Intrinsic Amplitude: 13.125 mV
Lead Channel Sensing Intrinsic Amplitude: 2.75 mV
Lead Channel Sensing Intrinsic Amplitude: 2.75 mV
Lead Channel Setting Pacing Amplitude: 1.5 V
Lead Channel Setting Pacing Amplitude: 2.5 V
Lead Channel Setting Pacing Pulse Width: 0.4 ms
Lead Channel Setting Sensing Sensitivity: 4 mV

## 2019-03-31 ENCOUNTER — Encounter: Payer: Self-pay | Admitting: Cardiology

## 2019-03-31 ENCOUNTER — Telehealth: Payer: PPO | Admitting: Cardiology

## 2019-03-31 NOTE — Progress Notes (Signed)
Remote pacemaker transmission.   

## 2019-05-25 DIAGNOSIS — Z1339 Encounter for screening examination for other mental health and behavioral disorders: Secondary | ICD-10-CM | POA: Diagnosis not present

## 2019-05-25 DIAGNOSIS — Z9181 History of falling: Secondary | ICD-10-CM | POA: Diagnosis not present

## 2019-05-25 DIAGNOSIS — R7301 Impaired fasting glucose: Secondary | ICD-10-CM | POA: Diagnosis not present

## 2019-05-25 DIAGNOSIS — Z1331 Encounter for screening for depression: Secondary | ICD-10-CM | POA: Diagnosis not present

## 2019-05-25 DIAGNOSIS — Z0000 Encounter for general adult medical examination without abnormal findings: Secondary | ICD-10-CM | POA: Diagnosis not present

## 2019-05-25 DIAGNOSIS — K219 Gastro-esophageal reflux disease without esophagitis: Secondary | ICD-10-CM | POA: Diagnosis not present

## 2019-05-25 DIAGNOSIS — I10 Essential (primary) hypertension: Secondary | ICD-10-CM | POA: Diagnosis not present

## 2019-05-25 DIAGNOSIS — Z139 Encounter for screening, unspecified: Secondary | ICD-10-CM | POA: Diagnosis not present

## 2019-05-25 DIAGNOSIS — E785 Hyperlipidemia, unspecified: Secondary | ICD-10-CM | POA: Diagnosis not present

## 2019-05-25 DIAGNOSIS — E663 Overweight: Secondary | ICD-10-CM | POA: Diagnosis not present

## 2019-05-25 DIAGNOSIS — Z6826 Body mass index (BMI) 26.0-26.9, adult: Secondary | ICD-10-CM | POA: Diagnosis not present

## 2019-05-25 DIAGNOSIS — Z125 Encounter for screening for malignant neoplasm of prostate: Secondary | ICD-10-CM | POA: Diagnosis not present

## 2019-06-21 ENCOUNTER — Ambulatory Visit (INDEPENDENT_AMBULATORY_CARE_PROVIDER_SITE_OTHER): Payer: PPO | Admitting: *Deleted

## 2019-06-21 DIAGNOSIS — I442 Atrioventricular block, complete: Secondary | ICD-10-CM

## 2019-06-21 LAB — CUP PACEART REMOTE DEVICE CHECK
Battery Remaining Longevity: 117 mo
Battery Voltage: 3.01 V
Brady Statistic AP VP Percent: 3.8 %
Brady Statistic AP VS Percent: 0 %
Brady Statistic AS VP Percent: 96.03 %
Brady Statistic AS VS Percent: 0.17 %
Brady Statistic RA Percent Paced: 3.81 %
Brady Statistic RV Percent Paced: 99.83 %
Date Time Interrogation Session: 20200720062439
Implantable Lead Implant Date: 20080903
Implantable Lead Implant Date: 20080903
Implantable Lead Location: 753859
Implantable Lead Location: 753860
Implantable Lead Model: 5076
Implantable Lead Model: 5594
Implantable Pulse Generator Implant Date: 20190416
Lead Channel Impedance Value: 342 Ohm
Lead Channel Impedance Value: 361 Ohm
Lead Channel Impedance Value: 399 Ohm
Lead Channel Impedance Value: 399 Ohm
Lead Channel Pacing Threshold Amplitude: 0.5 V
Lead Channel Pacing Threshold Amplitude: 1.125 V
Lead Channel Pacing Threshold Pulse Width: 0.4 ms
Lead Channel Pacing Threshold Pulse Width: 0.4 ms
Lead Channel Sensing Intrinsic Amplitude: 19 mV
Lead Channel Sensing Intrinsic Amplitude: 19 mV
Lead Channel Sensing Intrinsic Amplitude: 3.375 mV
Lead Channel Sensing Intrinsic Amplitude: 3.375 mV
Lead Channel Setting Pacing Amplitude: 1.5 V
Lead Channel Setting Pacing Amplitude: 2.5 V
Lead Channel Setting Pacing Pulse Width: 0.4 ms
Lead Channel Setting Sensing Sensitivity: 4 mV

## 2019-07-07 ENCOUNTER — Encounter: Payer: Self-pay | Admitting: Cardiology

## 2019-07-07 NOTE — Progress Notes (Signed)
Remote pacemaker transmission.   

## 2019-09-06 DIAGNOSIS — H00014 Hordeolum externum left upper eyelid: Secondary | ICD-10-CM | POA: Diagnosis not present

## 2019-09-20 ENCOUNTER — Ambulatory Visit (INDEPENDENT_AMBULATORY_CARE_PROVIDER_SITE_OTHER): Payer: PPO | Admitting: *Deleted

## 2019-09-20 DIAGNOSIS — I442 Atrioventricular block, complete: Secondary | ICD-10-CM | POA: Diagnosis not present

## 2019-09-21 LAB — CUP PACEART REMOTE DEVICE CHECK
Battery Remaining Longevity: 114 mo
Battery Voltage: 3.01 V
Brady Statistic AP VP Percent: 4.66 %
Brady Statistic AP VS Percent: 0 %
Brady Statistic AS VP Percent: 95.27 %
Brady Statistic AS VS Percent: 0.08 %
Brady Statistic RA Percent Paced: 4.65 %
Brady Statistic RV Percent Paced: 99.92 %
Date Time Interrogation Session: 20201019062502
Implantable Lead Implant Date: 20080903
Implantable Lead Implant Date: 20080903
Implantable Lead Location: 753859
Implantable Lead Location: 753860
Implantable Lead Model: 5076
Implantable Lead Model: 5594
Implantable Pulse Generator Implant Date: 20190416
Lead Channel Impedance Value: 323 Ohm
Lead Channel Impedance Value: 361 Ohm
Lead Channel Impedance Value: 399 Ohm
Lead Channel Impedance Value: 399 Ohm
Lead Channel Pacing Threshold Amplitude: 0.5 V
Lead Channel Pacing Threshold Amplitude: 1.125 V
Lead Channel Pacing Threshold Pulse Width: 0.4 ms
Lead Channel Pacing Threshold Pulse Width: 0.4 ms
Lead Channel Sensing Intrinsic Amplitude: 25.75 mV
Lead Channel Sensing Intrinsic Amplitude: 25.75 mV
Lead Channel Sensing Intrinsic Amplitude: 3.625 mV
Lead Channel Sensing Intrinsic Amplitude: 3.625 mV
Lead Channel Setting Pacing Amplitude: 1.5 V
Lead Channel Setting Pacing Amplitude: 2.5 V
Lead Channel Setting Pacing Pulse Width: 0.4 ms
Lead Channel Setting Sensing Sensitivity: 4 mV

## 2019-10-12 NOTE — Progress Notes (Signed)
Remote pacemaker transmission.   

## 2019-10-19 DIAGNOSIS — Z90411 Acquired partial absence of pancreas: Secondary | ICD-10-CM | POA: Diagnosis not present

## 2019-10-19 DIAGNOSIS — L739 Follicular disorder, unspecified: Secondary | ICD-10-CM | POA: Diagnosis not present

## 2019-11-12 NOTE — Progress Notes (Signed)
HPI: FU AVR. Also with prior PM. Had aortic valve replacement in December 2014 at [facility]. CTA December 2017showed 4.2 cm thoracic aortic aneurysm unchanged from previous. There was a 13 mm abnormality in the pancreas and MRI recommended. Now followed by oncology.Echocardiogram February 2018 showed normal LV systolic function, grade 2 diastolic dysfunction, mean aortic valve gradient 10 mmHg,mild mitral regurgitation and mildto moderatetricuspid regurgitation. Carotid Dopplers February 2018 showed no significant stenosis. Since last seen,the patient denies any dyspnea on exertion, orthopnea, PND, pedal edema, palpitations, syncope or chest pain.   Current Outpatient Medications  Medication Sig Dispense Refill  . amLODipine (NORVASC) 5 MG tablet TAKE ONE TABLET BY MOUTH DAILY (Patient taking differently: TAKE 5 MG BY MOUTH DAILY) 180 tablet 1  . aspirin 81 MG tablet Take 81 mg by mouth daily.    . cetirizine (ZYRTEC) 10 MG tablet Take 10 mg by mouth daily as needed for allergies or rhinitis.     . fluticasone (FLONASE) 50 MCG/ACT nasal spray Place 1 spray into both nostrils daily as needed for allergies or rhinitis.    Marland Kitchen omeprazole (PRILOSEC) 20 MG capsule Take 20 mg by mouth daily as needed.     No current facility-administered medications for this visit.     Past Medical History:  Diagnosis Date  . Arthritis   . Hypertension   . Pacemaker     Past Surgical History:  Procedure Laterality Date  . AORTIC VALVE REPLACEMENT    . EUS N/A 12/19/2016   Procedure: UPPER ENDOSCOPIC ULTRASOUND (EUS) RADIAL;  Surgeon: Milus Banister, MD;  [facility];  Service: Endoscopy;  Laterality: N/A;  . EUS N/A 12/19/2016   Procedure: UPPER ENDOSCOPIC ULTRASOUND (EUS) LINEAR;  Surgeon: Milus Banister, MD;  [facility];  Service: Endoscopy;  Laterality: N/A;  . EYE SURGERY     bilat cataracts  . HEMORRHOID SURGERY    . INSERT / REPLACE / REMOVE PACEMAKER    .  PPM GENERATOR CHANGEOUT N/A 02/06/2017   Procedure: PPM Generator Changeout;  Surgeon: Will Meredith Leeds, MD;  Location: [place] [facility];  Service: Cardiovascular;  Laterality: N/A;  . PPM GENERATOR CHANGEOUT N/A 03/17/2018   Procedure: PPM GENERATOR CHANGEOUT;  Surgeon: Constance Haw, MD;  Location: [place] [facility];  Service: Cardiovascular;  Laterality: N/A;  . TOTAL HIP ARTHROPLASTY  07/29/2012   Procedure: TOTAL HIP ARTHROPLASTY;  Surgeon: Gearlean Alf, MD;  [facility];  Service: Orthopedics;  Laterality: Right;    Social History   Socioeconomic History  . Marital status: Married    Spouse name: Not on file  . Number of children: 2  . Years of education: Not on file  . Highest education level: Not on file  Occupational History  . Not on file  Tobacco Use  . Smoking status: Never Smoker  . Smokeless tobacco: Never Used  Substance and Sexual Activity  . Alcohol use: No  . Drug use: No  . Sexual activity: Not on file  Other Topics Concern  . 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 Difficulty of Paying Living Expenses: Not on file  Food Insecurity:   . Worried About Charity fundraiser in the Last Year: Not on file  .  Ran Out of Food in the Last Year: Not on file  Transportation Needs:   . Lack of Transportation (Medical): Not on file  . Lack of Transportation (Non-Medical): Not on file  Physical Activity:   . Days of Exercise per Week: Not on file  . Minutes of Exercise per Session: Not on file  Stress:   . Feeling of Stress : Not on file  Social Connections:   . Frequency of Communication with Friends and Family: Not on file  . Frequency of Social Gatherings with Friends and Family: Not on file  . Attends Religious Services: Not on file  . Active Member of Clubs or  Organizations: Not on file  . Attends Archivist Meetings: Not on file  . Marital Status: Not on file  Intimate Partner Violence:   . Fear of Current or Ex-Partner: Not on file  . Emotionally Abused: Not on file  . Physically Abused: Not on file  . Sexually Abused: Not on file    Family History  Problem Relation Age of Onset  . Diabetes Mother   . Hypertension Mother   . Dementia Mother   . Heart failure Father   . Hypertension Father     ROS: no fevers or chills, productive cough, hemoptysis, dysphasia, odynophagia, melena, hematochezia, dysuria, hematuria, rash, seizure activity, orthopnea, PND, pedal edema, claudication. Remaining systems are negative.  Physical Exam: Well-developed well-nourished in no acute distress.  Skin is warm and dry.  HEENT is normal.  Neck is supple.  Chest is clear to auscultation with normal expansion.  Cardiovascular exam is regular rate and rhythm. 2/6 systolic murmur; no DM Abdominal exam nontender or distended. No masses palpated. Extremities show no edema. neuro grossly intact  ECG- Sinus with Vpaced; personally reviewed  A/P  1 prior aortic valve replacement-continue SBE prophylaxis.  2 history of dilated aortic root-CTA at [facility] January 2020 showed no aneurysm.  3 hypertension-patient's blood pressure is controlled.  Continue present medical regimen and follow.  4 prior pacemaker-managed by electrophysiology.  Kirk Ruths, MD

## 2019-11-17 ENCOUNTER — Encounter: Payer: Self-pay | Admitting: Cardiology

## 2019-11-17 ENCOUNTER — Other Ambulatory Visit: Payer: Self-pay

## 2019-11-17 ENCOUNTER — Ambulatory Visit (INDEPENDENT_AMBULATORY_CARE_PROVIDER_SITE_OTHER): Payer: PPO | Admitting: Cardiology

## 2019-11-17 VITALS — BP 130/78 | HR 82 | Ht 69.0 in | Wt 179.1 lb

## 2019-11-17 DIAGNOSIS — Z952 Presence of prosthetic heart valve: Secondary | ICD-10-CM

## 2019-11-17 DIAGNOSIS — Z950 Presence of cardiac pacemaker: Secondary | ICD-10-CM | POA: Diagnosis not present

## 2019-11-17 DIAGNOSIS — I10 Essential (primary) hypertension: Secondary | ICD-10-CM | POA: Diagnosis not present

## 2019-11-17 DIAGNOSIS — I712 Thoracic aortic aneurysm, without rupture, unspecified: Secondary | ICD-10-CM

## 2019-11-17 NOTE — Patient Instructions (Signed)

## 2019-11-24 DIAGNOSIS — K219 Gastro-esophageal reflux disease without esophagitis: Secondary | ICD-10-CM | POA: Diagnosis not present

## 2019-11-24 DIAGNOSIS — E785 Hyperlipidemia, unspecified: Secondary | ICD-10-CM | POA: Diagnosis not present

## 2019-11-24 DIAGNOSIS — R7301 Impaired fasting glucose: Secondary | ICD-10-CM | POA: Diagnosis not present

## 2019-11-24 DIAGNOSIS — Z125 Encounter for screening for malignant neoplasm of prostate: Secondary | ICD-10-CM | POA: Diagnosis not present

## 2019-11-24 DIAGNOSIS — I10 Essential (primary) hypertension: Secondary | ICD-10-CM | POA: Diagnosis not present

## 2019-11-24 DIAGNOSIS — Z6826 Body mass index (BMI) 26.0-26.9, adult: Secondary | ICD-10-CM | POA: Diagnosis not present

## 2019-12-08 DIAGNOSIS — H2703 Aphakia, bilateral: Secondary | ICD-10-CM | POA: Diagnosis not present

## 2019-12-14 DIAGNOSIS — D3A8 Other benign neuroendocrine tumors: Secondary | ICD-10-CM | POA: Diagnosis not present

## 2019-12-14 DIAGNOSIS — Z9889 Other specified postprocedural states: Secondary | ICD-10-CM | POA: Diagnosis not present

## 2019-12-14 DIAGNOSIS — Z952 Presence of prosthetic heart valve: Secondary | ICD-10-CM | POA: Diagnosis not present

## 2019-12-14 DIAGNOSIS — R972 Elevated prostate specific antigen [PSA]: Secondary | ICD-10-CM | POA: Diagnosis not present

## 2019-12-14 DIAGNOSIS — Z950 Presence of cardiac pacemaker: Secondary | ICD-10-CM | POA: Diagnosis not present

## 2019-12-20 ENCOUNTER — Ambulatory Visit (INDEPENDENT_AMBULATORY_CARE_PROVIDER_SITE_OTHER): Payer: PPO | Admitting: *Deleted

## 2019-12-20 DIAGNOSIS — Z950 Presence of cardiac pacemaker: Secondary | ICD-10-CM | POA: Diagnosis not present

## 2019-12-20 LAB — CUP PACEART REMOTE DEVICE CHECK
Battery Remaining Longevity: 111 mo
Battery Voltage: 3 V
Brady Statistic AP VP Percent: 3.11 %
Brady Statistic AP VS Percent: 0 %
Brady Statistic AS VP Percent: 96.83 %
Brady Statistic AS VS Percent: 0.06 %
Brady Statistic RA Percent Paced: 3.11 %
Brady Statistic RV Percent Paced: 99.94 %
Date Time Interrogation Session: 20210118012638
Implantable Lead Implant Date: 20080903
Implantable Lead Implant Date: 20080903
Implantable Lead Location: 753859
Implantable Lead Location: 753860
Implantable Lead Model: 5076
Implantable Lead Model: 5594
Implantable Pulse Generator Implant Date: 20190416
Lead Channel Impedance Value: 323 Ohm
Lead Channel Impedance Value: 361 Ohm
Lead Channel Impedance Value: 399 Ohm
Lead Channel Impedance Value: 399 Ohm
Lead Channel Pacing Threshold Amplitude: 0.5 V
Lead Channel Pacing Threshold Amplitude: 1.125 V
Lead Channel Pacing Threshold Pulse Width: 0.4 ms
Lead Channel Pacing Threshold Pulse Width: 0.4 ms
Lead Channel Sensing Intrinsic Amplitude: 13.625 mV
Lead Channel Sensing Intrinsic Amplitude: 13.625 mV
Lead Channel Sensing Intrinsic Amplitude: 4 mV
Lead Channel Sensing Intrinsic Amplitude: 4 mV
Lead Channel Setting Pacing Amplitude: 1.5 V
Lead Channel Setting Pacing Amplitude: 2.5 V
Lead Channel Setting Pacing Pulse Width: 0.4 ms
Lead Channel Setting Sensing Sensitivity: 4 mV

## 2019-12-21 NOTE — Progress Notes (Signed)
PPM Remote  

## 2020-03-08 DIAGNOSIS — R972 Elevated prostate specific antigen [PSA]: Secondary | ICD-10-CM | POA: Diagnosis not present

## 2020-03-14 DIAGNOSIS — N5201 Erectile dysfunction due to arterial insufficiency: Secondary | ICD-10-CM | POA: Diagnosis not present

## 2020-03-14 DIAGNOSIS — R972 Elevated prostate specific antigen [PSA]: Secondary | ICD-10-CM | POA: Diagnosis not present

## 2020-03-20 ENCOUNTER — Ambulatory Visit (INDEPENDENT_AMBULATORY_CARE_PROVIDER_SITE_OTHER): Payer: PPO | Admitting: *Deleted

## 2020-03-20 DIAGNOSIS — Z950 Presence of cardiac pacemaker: Secondary | ICD-10-CM | POA: Diagnosis not present

## 2020-03-20 LAB — CUP PACEART REMOTE DEVICE CHECK
Battery Remaining Longevity: 108 mo
Battery Voltage: 3 V
Brady Statistic AP VP Percent: 4.36 %
Brady Statistic AP VS Percent: 0 %
Brady Statistic AS VP Percent: 95.39 %
Brady Statistic AS VS Percent: 0.25 %
Brady Statistic RA Percent Paced: 4.36 %
Brady Statistic RV Percent Paced: 99.75 %
Date Time Interrogation Session: 20210419022540
Implantable Lead Implant Date: 20080903
Implantable Lead Implant Date: 20080903
Implantable Lead Location: 753859
Implantable Lead Location: 753860
Implantable Lead Model: 5076
Implantable Lead Model: 5594
Implantable Pulse Generator Implant Date: 20190416
Lead Channel Impedance Value: 323 Ohm
Lead Channel Impedance Value: 361 Ohm
Lead Channel Impedance Value: 399 Ohm
Lead Channel Impedance Value: 399 Ohm
Lead Channel Pacing Threshold Amplitude: 0.5 V
Lead Channel Pacing Threshold Amplitude: 1.25 V
Lead Channel Pacing Threshold Pulse Width: 0.4 ms
Lead Channel Pacing Threshold Pulse Width: 0.4 ms
Lead Channel Sensing Intrinsic Amplitude: 15.375 mV
Lead Channel Sensing Intrinsic Amplitude: 15.375 mV
Lead Channel Sensing Intrinsic Amplitude: 3.375 mV
Lead Channel Sensing Intrinsic Amplitude: 3.375 mV
Lead Channel Setting Pacing Amplitude: 1.5 V
Lead Channel Setting Pacing Amplitude: 2.5 V
Lead Channel Setting Pacing Pulse Width: 0.4 ms
Lead Channel Setting Sensing Sensitivity: 4 mV

## 2020-03-21 NOTE — Progress Notes (Signed)
PPM Remote  

## 2020-04-11 DIAGNOSIS — N5201 Erectile dysfunction due to arterial insufficiency: Secondary | ICD-10-CM | POA: Diagnosis not present

## 2020-05-17 DIAGNOSIS — E538 Deficiency of other specified B group vitamins: Secondary | ICD-10-CM | POA: Diagnosis not present

## 2020-05-17 DIAGNOSIS — E785 Hyperlipidemia, unspecified: Secondary | ICD-10-CM | POA: Diagnosis not present

## 2020-05-17 DIAGNOSIS — R7301 Impaired fasting glucose: Secondary | ICD-10-CM | POA: Diagnosis not present

## 2020-05-17 DIAGNOSIS — I10 Essential (primary) hypertension: Secondary | ICD-10-CM | POA: Diagnosis not present

## 2020-05-17 DIAGNOSIS — K219 Gastro-esophageal reflux disease without esophagitis: Secondary | ICD-10-CM | POA: Diagnosis not present

## 2020-05-17 DIAGNOSIS — R413 Other amnesia: Secondary | ICD-10-CM | POA: Diagnosis not present

## 2020-05-23 DIAGNOSIS — R413 Other amnesia: Secondary | ICD-10-CM | POA: Diagnosis not present

## 2020-05-24 DIAGNOSIS — E538 Deficiency of other specified B group vitamins: Secondary | ICD-10-CM | POA: Diagnosis not present

## 2020-05-25 DIAGNOSIS — Z1331 Encounter for screening for depression: Secondary | ICD-10-CM | POA: Diagnosis not present

## 2020-05-25 DIAGNOSIS — Z0000 Encounter for general adult medical examination without abnormal findings: Secondary | ICD-10-CM | POA: Diagnosis not present

## 2020-05-25 DIAGNOSIS — Z9181 History of falling: Secondary | ICD-10-CM | POA: Diagnosis not present

## 2020-05-25 DIAGNOSIS — E785 Hyperlipidemia, unspecified: Secondary | ICD-10-CM | POA: Diagnosis not present

## 2020-05-31 ENCOUNTER — Telehealth: Payer: Self-pay

## 2020-05-31 NOTE — Telephone Encounter (Signed)
The pt is receiving a new monitor. He will receive it on near July 19th.

## 2020-06-09 DIAGNOSIS — I10 Essential (primary) hypertension: Secondary | ICD-10-CM | POA: Diagnosis not present

## 2020-06-09 DIAGNOSIS — R413 Other amnesia: Secondary | ICD-10-CM | POA: Diagnosis not present

## 2020-06-09 DIAGNOSIS — D519 Vitamin B12 deficiency anemia, unspecified: Secondary | ICD-10-CM | POA: Diagnosis not present

## 2020-06-19 ENCOUNTER — Ambulatory Visit (INDEPENDENT_AMBULATORY_CARE_PROVIDER_SITE_OTHER): Payer: PPO | Admitting: *Deleted

## 2020-06-19 DIAGNOSIS — I442 Atrioventricular block, complete: Secondary | ICD-10-CM

## 2020-06-22 LAB — CUP PACEART REMOTE DEVICE CHECK
Battery Remaining Longevity: 102 mo
Battery Voltage: 3 V
Brady Statistic AP VP Percent: 0.22 %
Brady Statistic AP VS Percent: 0 %
Brady Statistic AS VP Percent: 99.78 %
Brady Statistic AS VS Percent: 0 %
Brady Statistic RA Percent Paced: 0.22 %
Brady Statistic RV Percent Paced: 100 %
Date Time Interrogation Session: 20210721180549
Implantable Lead Implant Date: 20080903
Implantable Lead Implant Date: 20080903
Implantable Lead Location: 753859
Implantable Lead Location: 753860
Implantable Lead Model: 5076
Implantable Lead Model: 5594
Implantable Pulse Generator Implant Date: 20190416
Lead Channel Impedance Value: 304 Ohm
Lead Channel Impedance Value: 323 Ohm
Lead Channel Impedance Value: 361 Ohm
Lead Channel Impedance Value: 361 Ohm
Lead Channel Pacing Threshold Amplitude: 0.5 V
Lead Channel Pacing Threshold Amplitude: 1.25 V
Lead Channel Pacing Threshold Pulse Width: 0.4 ms
Lead Channel Pacing Threshold Pulse Width: 0.4 ms
Lead Channel Sensing Intrinsic Amplitude: 14.625 mV
Lead Channel Sensing Intrinsic Amplitude: 14.625 mV
Lead Channel Sensing Intrinsic Amplitude: 2.25 mV
Lead Channel Sensing Intrinsic Amplitude: 2.25 mV
Lead Channel Setting Pacing Amplitude: 1.5 V
Lead Channel Setting Pacing Amplitude: 2.5 V
Lead Channel Setting Pacing Pulse Width: 0.4 ms
Lead Channel Setting Sensing Sensitivity: 4 mV

## 2020-06-26 NOTE — Progress Notes (Signed)
Remote pacemaker transmission.   

## 2020-08-18 DIAGNOSIS — D519 Vitamin B12 deficiency anemia, unspecified: Secondary | ICD-10-CM | POA: Diagnosis not present

## 2020-08-18 DIAGNOSIS — I10 Essential (primary) hypertension: Secondary | ICD-10-CM | POA: Diagnosis not present

## 2020-08-18 DIAGNOSIS — K219 Gastro-esophageal reflux disease without esophagitis: Secondary | ICD-10-CM | POA: Diagnosis not present

## 2020-08-18 DIAGNOSIS — Z139 Encounter for screening, unspecified: Secondary | ICD-10-CM | POA: Diagnosis not present

## 2020-08-18 DIAGNOSIS — R7301 Impaired fasting glucose: Secondary | ICD-10-CM | POA: Diagnosis not present

## 2020-08-18 DIAGNOSIS — E785 Hyperlipidemia, unspecified: Secondary | ICD-10-CM | POA: Diagnosis not present

## 2020-08-23 DIAGNOSIS — E875 Hyperkalemia: Secondary | ICD-10-CM | POA: Diagnosis not present

## 2020-09-18 ENCOUNTER — Ambulatory Visit (INDEPENDENT_AMBULATORY_CARE_PROVIDER_SITE_OTHER): Payer: PPO

## 2020-09-18 DIAGNOSIS — I442 Atrioventricular block, complete: Secondary | ICD-10-CM | POA: Diagnosis not present

## 2020-09-19 LAB — CUP PACEART REMOTE DEVICE CHECK
Battery Remaining Longevity: 102 mo
Battery Voltage: 3 V
Brady Statistic AP VP Percent: 9.58 %
Brady Statistic AP VS Percent: 0.01 %
Brady Statistic AS VP Percent: 90.04 %
Brady Statistic AS VS Percent: 0.38 %
Brady Statistic RA Percent Paced: 9.61 %
Brady Statistic RV Percent Paced: 99.61 %
Date Time Interrogation Session: 20211018022451
Implantable Lead Implant Date: 20080903
Implantable Lead Implant Date: 20080903
Implantable Lead Location: 753859
Implantable Lead Location: 753860
Implantable Lead Model: 5076
Implantable Lead Model: 5594
Implantable Pulse Generator Implant Date: 20190416
Lead Channel Impedance Value: 323 Ohm
Lead Channel Impedance Value: 361 Ohm
Lead Channel Impedance Value: 399 Ohm
Lead Channel Impedance Value: 399 Ohm
Lead Channel Pacing Threshold Amplitude: 0.5 V
Lead Channel Pacing Threshold Amplitude: 1.125 V
Lead Channel Pacing Threshold Pulse Width: 0.4 ms
Lead Channel Pacing Threshold Pulse Width: 0.4 ms
Lead Channel Sensing Intrinsic Amplitude: 16 mV
Lead Channel Sensing Intrinsic Amplitude: 16 mV
Lead Channel Sensing Intrinsic Amplitude: 3.125 mV
Lead Channel Sensing Intrinsic Amplitude: 3.125 mV
Lead Channel Setting Pacing Amplitude: 1.5 V
Lead Channel Setting Pacing Amplitude: 2.5 V
Lead Channel Setting Pacing Pulse Width: 0.4 ms
Lead Channel Setting Sensing Sensitivity: 4 mV

## 2020-09-25 NOTE — Progress Notes (Signed)
Remote pacemaker transmission.   

## 2020-11-28 NOTE — Progress Notes (Deleted)
HPI: FU AVR.Also with prior PM.Had aortic valve replacement in December 2014 at [facility]. CTA December 2017showed 4.2 cm thoracic aortic aneurysm unchanged from previous. There was a 13 mm abnormality in the pancreas and MRI recommended. Now followed by oncology.Echocardiogram February 2018 showed normal LV systolic function, grade 2 diastolic dysfunction, mean aortic valve gradient 10 mmHg,mild mitral regurgitation and mildto moderate tricuspid regurgitation. Carotid Dopplers February 2018 showed no significant stenosis.  Follow-up CT of the liver in January 2021.  The thoracic aorta was measured at 4.3 cm unchanged compared to previous.  Since last seen,  Current Outpatient Medications  Medication Sig Dispense Refill  . amLODipine (NORVASC) 5 MG tablet TAKE ONE TABLET BY MOUTH DAILY (Patient taking differently: TAKE 5 MG BY MOUTH DAILY) 180 tablet 1  . aspirin 81 MG tablet Take 81 mg by mouth daily.    . cetirizine (ZYRTEC) 10 MG tablet Take 10 mg by mouth daily as needed for allergies or rhinitis.     . fluticasone (FLONASE) 50 MCG/ACT nasal spray Place 1 spray into both nostrils daily as needed for allergies or rhinitis.    Marland Kitchen omeprazole (PRILOSEC) 20 MG capsule Take 20 mg by mouth daily as needed.     No current facility-administered medications for this visit.     Past Medical History:  Diagnosis Date  . Arthritis   . Hypertension   . Pacemaker     Past Surgical History:  Procedure Laterality Date  . AORTIC VALVE REPLACEMENT    . EUS N/A 12/19/2016   Procedure: UPPER ENDOSCOPIC ULTRASOUND (EUS) RADIAL;  Surgeon: Milus Banister, MD;  [facility];  Service: Endoscopy;  Laterality: N/A;  . EUS N/A 12/19/2016   Procedure: UPPER ENDOSCOPIC ULTRASOUND (EUS) LINEAR;  Surgeon: Milus Banister, MD;  [facility];  Service: Endoscopy;  Laterality: N/A;  . EYE SURGERY     bilat cataracts  . HEMORRHOID SURGERY    . INSERT / REPLACE / REMOVE  PACEMAKER    . PPM GENERATOR CHANGEOUT N/A 02/06/2017   Procedure: PPM Generator Changeout;  Surgeon: Will Meredith Leeds, MD;  Location: [place] [facility];  Service: Cardiovascular;  Laterality: N/A;  . PPM GENERATOR CHANGEOUT N/A 03/17/2018   Procedure: PPM GENERATOR CHANGEOUT;  Surgeon: Constance Haw, MD;  Location: [place] [facility];  Service: Cardiovascular;  Laterality: N/A;  . TOTAL HIP ARTHROPLASTY  07/29/2012   Procedure: TOTAL HIP ARTHROPLASTY;  Surgeon: Gearlean Alf, MD;  [facility];  Service: Orthopedics;  Laterality: Right;    Social History   Socioeconomic History  . Marital status: Married    Spouse name: Not on file  . Number of children: 2  . Years of education: Not on file  . Highest education level: Not on file  Occupational History  . Not on file  Tobacco Use  . Smoking status: Never Smoker  . Smokeless tobacco: Never Used  Vaping Use  . Vaping Use: Never used  Substance and Sexual Activity  . Alcohol use: No  . Drug use: No  . Sexual activity: Not on file  Other Topics Concern  . 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 Diabetes Mother   . Hypertension Mother   . Dementia Mother   . Heart failure Father   . Hypertension Father     ROS: no fevers or chills, productive cough, hemoptysis, dysphasia, odynophagia, melena, hematochezia, dysuria, hematuria, rash, seizure activity, orthopnea, PND, pedal edema, claudication. Remaining systems are negative.  Physical Exam: Well-developed well-nourished in no acute  distress.  Skin is warm and dry.  HEENT is normal.  Neck is supple.  Chest is clear to auscultation with normal expansion.  Cardiovascular exam is regular rate and rhythm.  Abdominal exam nontender or distended. No masses palpated. Extremities show no edema. neuro grossly intact  ECG- personally reviewed  A/P  1 status post aortic valve replacement-continue SBE prophylaxis.  2 dilated aortic root-most recent CTA measured 4.3 cm which was unchanged.  Will need follow-up studies in the future.  3 hypertension-blood pressure controlled.  Continue present medications and follow.  4 history of pacemaker-Per electrophysiology.  Olga Millers, MD

## 2020-12-05 ENCOUNTER — Telehealth: Payer: Self-pay | Admitting: Cardiology

## 2020-12-05 NOTE — Telephone Encounter (Signed)
Ok for virtual visit Nathaniel Richard

## 2020-12-05 NOTE — Telephone Encounter (Signed)
Spoke with pt, Follow up scheduled as my chart visit

## 2020-12-05 NOTE — Telephone Encounter (Signed)
Patient may have been exposed to COVID Saturday 12/02/20. Patient wanted to know if he could do a Virtual Visit. Please advise

## 2020-12-06 ENCOUNTER — Ambulatory Visit: Payer: PPO | Admitting: Cardiology

## 2020-12-06 ENCOUNTER — Encounter: Payer: Self-pay | Admitting: Cardiology

## 2020-12-06 ENCOUNTER — Telehealth (INDEPENDENT_AMBULATORY_CARE_PROVIDER_SITE_OTHER): Payer: PPO | Admitting: Cardiology

## 2020-12-06 VITALS — BP 132/79 | HR 71 | Ht 69.0 in | Wt 182.0 lb

## 2020-12-06 DIAGNOSIS — I712 Thoracic aortic aneurysm, without rupture, unspecified: Secondary | ICD-10-CM

## 2020-12-06 DIAGNOSIS — Z950 Presence of cardiac pacemaker: Secondary | ICD-10-CM

## 2020-12-06 DIAGNOSIS — I10 Essential (primary) hypertension: Secondary | ICD-10-CM

## 2020-12-06 DIAGNOSIS — Z952 Presence of prosthetic heart valve: Secondary | ICD-10-CM

## 2020-12-06 NOTE — Patient Instructions (Signed)
  Testing/Procedures:  Your physician has requested that you have an echocardiogram. Echocardiography is a painless test that uses sound waves to create images of your heart. It provides your doctor with information about the size and shape of your heart and how well your heart's chambers and valves are working. This procedure takes approximately one hour. There are no restrictions for this procedure.[address] [place]     Follow-Up: At [facility], you and your health needs are our priority.  As part of our continuing mission to provide you with exceptional heart care, we have created designated Provider Care Teams.  These Care Teams include your primary Cardiologist (physician) and Advanced Practice Providers (APPs -  Physician Assistants and Nurse Practitioners) who all work together to provide you with the care you need, when you need it.  We recommend signing up for the patient portal called "MyChart".  Sign up information is provided on this After Visit Summary.  MyChart is used to connect with patients for Virtual Visits (Telemedicine).  Patients are able to view lab/test results, encounter notes, upcoming appointments, etc.  Non-urgent messages can be sent to your provider as well.   To learn more about what you can do with MyChart, go to [email or web address].    Your next appointment:   12 month(s)  The format for your next appointment:   In Person  Provider:   Olga Millers, MD

## 2020-12-06 NOTE — Progress Notes (Signed)
Virtual Visit via Video Note   This visit type was conducted due to national recommendations for restrictions regarding the COVID-19 Pandemic (e.g. social distancing) in an effort to limit this patient's exposure and mitigate transmission in our community.  Due to his co-morbid illnesses, this patient is at least at moderate risk for complications without adequate follow up.  This format is felt to be most appropriate for this patient at this time.  All issues noted in this document were discussed and addressed.  A limited physical exam was performed with this format.  Please refer to the patient's chart for his consent to telehealth for [facility].  Patient Location:Home Provider Location: Home   HPI: FU AVR.Also with prior PM.Had aortic valve replacement in December 2014 at [facility]. CTA December 2017showed 4.2 cm thoracic aortic aneurysm unchanged from previous. There was a 13 mm abnormality in the pancreas and MRI recommended. Now followed by oncology.Echocardiogram February 2018 showed normal LV systolic function, grade 2 diastolic dysfunction, mean aortic valve gradient 10 mmHg,mild mitral regurgitation and mildto moderate tricuspid regurgitation. Carotid Dopplers February 2018 showed no significant stenosis.  Follow-up CT of the liver in January 2021.  The thoracic aorta was measured at 4.3 cm unchanged compared to previous.  Since last seen,the patient denies any dyspnea on exertion, orthopnea, PND, pedal edema, palpitations, syncope or chest pain.   Current Outpatient Medications  Medication Sig Dispense Refill  . amLODipine (NORVASC) 5 MG tablet TAKE ONE TABLET BY MOUTH DAILY 180 tablet 1  . amoxicillin (AMOXIL) 500 MG tablet Take 2,000 mg by mouth as directed. Before dental appointments    . aspirin 81 MG tablet Take 81 mg by mouth daily.    . cetirizine (ZYRTEC) 10 MG tablet Take 10 mg by mouth daily as needed for allergies or rhinitis.     . fluticasone (FLONASE)  50 MCG/ACT nasal spray Place 1 spray into both nostrils daily as needed for allergies or rhinitis.    Marland Kitchen omeprazole (PRILOSEC) 20 MG capsule Take 20 mg by mouth daily as needed.     No current facility-administered medications for this visit.     Past Medical History:  Diagnosis Date  . Arthritis   . Hypertension   . Pacemaker     Past Surgical History:  Procedure Laterality Date  . AORTIC VALVE REPLACEMENT    . EUS N/A 12/19/2016   Procedure: UPPER ENDOSCOPIC ULTRASOUND (EUS) RADIAL;  Surgeon: Rachael Fee, MD;  [facility];  Service: Endoscopy;  Laterality: N/A;  . EUS N/A 12/19/2016   Procedure: UPPER ENDOSCOPIC ULTRASOUND (EUS) LINEAR;  Surgeon: Rachael Fee, MD;  [facility];  Service: Endoscopy;  Laterality: N/A;  . EYE SURGERY     bilat cataracts  . HEMORRHOID SURGERY    . INSERT / REPLACE / REMOVE PACEMAKER    . PPM GENERATOR CHANGEOUT N/A 02/06/2017   Procedure: PPM Generator Changeout;  Surgeon: Will Jorja Loa, MD;  [facility]: [facility];  Service: Cardiovascular;  Laterality: N/A;  . PPM GENERATOR CHANGEOUT N/A 03/17/2018   Procedure: PPM GENERATOR CHANGEOUT;  Surgeon: Regan Lemming, MD;  Location: [facility];  Service: Cardiovascular;  Laterality: N/A;  . TOTAL HIP ARTHROPLASTY  07/29/2012   Procedure: TOTAL HIP ARTHROPLASTY;  Surgeon: Loanne Drilling, MD;  [facility];  Service: Orthopedics;  Laterality: Right;    Social History   Socioeconomic History  . Marital status: Married    Spouse name: Not on  file  . Number of children: 2  . Years of education: Not on file  . Highest education level: Not on file  Occupational History  . Not on file  Tobacco Use  . Smoking status: Never Smoker  . Smokeless tobacco: Never Used  Vaping Use  . Vaping Use: Never used  Substance and Sexual Activity  . Alcohol use: No  . Drug use: No  . Sexual activity: Not on file  Other Topics Concern  . 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 Diabetes Mother   . Hypertension Mother   . Dementia Mother   . Heart failure Father   . Hypertension Father     ROS: no fevers or chills, productive cough, hemoptysis, dysphasia, odynophagia, melena, hematochezia, dysuria, hematuria, rash, seizure activity, orthopnea, PND, pedal edema, claudication. Remaining systems are negative.  Physical Exam: No acute distress Answers questions appropriately Normal affect Remainder physical examination not performed (coronavirus pandemic; telehealth visit).   A/P  1 status post aortic valve replacement-continue SBE prophylaxis.  Repeat echocardiogram.  2 dilated aortic root-most recent CTA measured 4.3 cm which was unchanged.  He is scheduled to have a CT [facility] his [facility] at [facility] next week.  Hopefully we can include his thoracic aorta to make sure that size is unchanged.  3 hypertension-Continue present medications and follow.  4 history of pacemaker-Per electrophysiology.  COVID-19 Education: The importance of social distancing was discussed today.  Time:   Today, I have spent 16 minutes with the patient with telehealth technology discussing the above problems.     Medication Adjustments/Labs and Tests Ordered: Current medicines are reviewed at length with the patient today.  Concerns regarding medicines are outlined above.   Tests Ordered: No orders of the defined types were placed in this encounter.   Medication Changes: No orders of the defined types were placed in this  encounter.   Follow Up:  In Person in 12 month(s)  Signed, Kirk Ruths, MD  12/05/2020 1:36 PM    [facility]

## 2020-12-18 ENCOUNTER — Ambulatory Visit (INDEPENDENT_AMBULATORY_CARE_PROVIDER_SITE_OTHER): Payer: Medicare Other

## 2020-12-18 DIAGNOSIS — I442 Atrioventricular block, complete: Secondary | ICD-10-CM | POA: Diagnosis not present

## 2020-12-20 LAB — CUP PACEART REMOTE DEVICE CHECK
Battery Remaining Longevity: 99 mo
Battery Voltage: 2.99 V
Brady Statistic AP VP Percent: 5.16 %
Brady Statistic AP VS Percent: 0 %
Brady Statistic AS VP Percent: 94.49 %
Brady Statistic AS VS Percent: 0.34 %
Brady Statistic RA Percent Paced: 5.19 %
Brady Statistic RV Percent Paced: 99.65 %
Date Time Interrogation Session: 20220118134605
Implantable Lead Implant Date: 20080903
Implantable Lead Implant Date: 20080903
Implantable Lead Location: 753859
Implantable Lead Location: 753860
Implantable Lead Model: 5076
Implantable Lead Model: 5594
Implantable Pulse Generator Implant Date: 20190416
Lead Channel Impedance Value: 323 Ohm
Lead Channel Impedance Value: 361 Ohm
Lead Channel Impedance Value: 399 Ohm
Lead Channel Impedance Value: 399 Ohm
Lead Channel Pacing Threshold Amplitude: 0.625 V
Lead Channel Pacing Threshold Amplitude: 1 V
Lead Channel Pacing Threshold Pulse Width: 0.4 ms
Lead Channel Pacing Threshold Pulse Width: 0.4 ms
Lead Channel Sensing Intrinsic Amplitude: 14.5 mV
Lead Channel Sensing Intrinsic Amplitude: 14.5 mV
Lead Channel Sensing Intrinsic Amplitude: 3.5 mV
Lead Channel Sensing Intrinsic Amplitude: 3.5 mV
Lead Channel Setting Pacing Amplitude: 1.5 V
Lead Channel Setting Pacing Amplitude: 2.5 V
Lead Channel Setting Pacing Pulse Width: 0.4 ms
Lead Channel Setting Sensing Sensitivity: 4 mV

## 2021-01-01 NOTE — Progress Notes (Signed)
Remote pacemaker transmission.   

## 2021-01-02 ENCOUNTER — Other Ambulatory Visit: Payer: Medicare Other

## 2021-01-30 ENCOUNTER — Other Ambulatory Visit: Payer: Self-pay

## 2021-01-30 ENCOUNTER — Ambulatory Visit (INDEPENDENT_AMBULATORY_CARE_PROVIDER_SITE_OTHER): Payer: PPO

## 2021-01-30 DIAGNOSIS — Z952 Presence of prosthetic heart valve: Secondary | ICD-10-CM

## 2021-01-30 LAB — ECHOCARDIOGRAM COMPLETE
AR max vel: 0.93 cm2
AV Area VTI: 0.84 cm2
AV Area mean vel: 0.96 cm2
AV Mean grad: 18 mmHg
AV Peak grad: 31.5 mmHg
Ao pk vel: 2.81 m/s
Area-P 1/2: 5.38 cm2
S' Lateral: 2.5 cm

## 2021-01-30 NOTE — Progress Notes (Signed)
Complete echocardiogram performed.  Jimmy Tymere Depuy RDCS, RVT  

## 2021-02-16 DIAGNOSIS — R7301 Impaired fasting glucose: Secondary | ICD-10-CM | POA: Diagnosis not present

## 2021-02-16 DIAGNOSIS — E785 Hyperlipidemia, unspecified: Secondary | ICD-10-CM | POA: Diagnosis not present

## 2021-02-16 DIAGNOSIS — I10 Essential (primary) hypertension: Secondary | ICD-10-CM | POA: Diagnosis not present

## 2021-02-16 DIAGNOSIS — K219 Gastro-esophageal reflux disease without esophagitis: Secondary | ICD-10-CM | POA: Diagnosis not present

## 2021-02-16 DIAGNOSIS — Z6826 Body mass index (BMI) 26.0-26.9, adult: Secondary | ICD-10-CM | POA: Diagnosis not present

## 2021-02-16 DIAGNOSIS — D519 Vitamin B12 deficiency anemia, unspecified: Secondary | ICD-10-CM | POA: Diagnosis not present

## 2021-02-16 DIAGNOSIS — Z125 Encounter for screening for malignant neoplasm of prostate: Secondary | ICD-10-CM | POA: Diagnosis not present

## 2021-03-19 ENCOUNTER — Ambulatory Visit (INDEPENDENT_AMBULATORY_CARE_PROVIDER_SITE_OTHER): Payer: PPO

## 2021-03-19 DIAGNOSIS — I442 Atrioventricular block, complete: Secondary | ICD-10-CM | POA: Diagnosis not present

## 2021-03-21 LAB — CUP PACEART REMOTE DEVICE CHECK
Battery Remaining Longevity: 96 mo
Battery Voltage: 2.99 V
Brady Statistic AP VP Percent: 3.26 %
Brady Statistic AP VS Percent: 0.01 %
Brady Statistic AS VP Percent: 96.41 %
Brady Statistic AS VS Percent: 0.33 %
Brady Statistic RA Percent Paced: 3.27 %
Brady Statistic RV Percent Paced: 99.67 %
Date Time Interrogation Session: 20220419111804
Implantable Lead Implant Date: 20080903
Implantable Lead Implant Date: 20080903
Implantable Lead Location: 753859
Implantable Lead Location: 753860
Implantable Lead Model: 5076
Implantable Lead Model: 5594
Implantable Pulse Generator Implant Date: 20190416
Lead Channel Impedance Value: 342 Ohm
Lead Channel Impedance Value: 361 Ohm
Lead Channel Impedance Value: 399 Ohm
Lead Channel Impedance Value: 418 Ohm
Lead Channel Pacing Threshold Amplitude: 0.625 V
Lead Channel Pacing Threshold Amplitude: 1 V
Lead Channel Pacing Threshold Pulse Width: 0.4 ms
Lead Channel Pacing Threshold Pulse Width: 0.4 ms
Lead Channel Sensing Intrinsic Amplitude: 18.25 mV
Lead Channel Sensing Intrinsic Amplitude: 18.25 mV
Lead Channel Sensing Intrinsic Amplitude: 3.125 mV
Lead Channel Sensing Intrinsic Amplitude: 3.125 mV
Lead Channel Setting Pacing Amplitude: 1.5 V
Lead Channel Setting Pacing Amplitude: 2.5 V
Lead Channel Setting Pacing Pulse Width: 0.4 ms
Lead Channel Setting Sensing Sensitivity: 4 mV

## 2021-04-04 NOTE — Progress Notes (Signed)
Remote pacemaker transmission.   

## 2021-04-05 DIAGNOSIS — J301 Allergic rhinitis due to pollen: Secondary | ICD-10-CM | POA: Diagnosis not present

## 2021-04-10 ENCOUNTER — Other Ambulatory Visit: Payer: Self-pay

## 2021-04-10 ENCOUNTER — Encounter: Payer: Self-pay | Admitting: Cardiology

## 2021-04-10 ENCOUNTER — Ambulatory Visit: Payer: PPO | Admitting: Cardiology

## 2021-04-10 VITALS — BP 138/84 | HR 75 | Ht 69.0 in | Wt 182.0 lb

## 2021-04-10 DIAGNOSIS — I442 Atrioventricular block, complete: Secondary | ICD-10-CM

## 2021-04-10 NOTE — Patient Instructions (Signed)
Medication Instructions:  Your physician recommends that you continue on your current medications as directed. Please refer to the Current Medication list given to you today.  *If you need a refill on your cardiac medications before your next appointment, please call your pharmacy*   Lab Work: None ordered If you have labs (blood work) drawn today and your tests are completely normal, you will receive your results only by: Marland Kitchen MyChart Message (if you have MyChart) OR . A paper copy in the mail If you have any lab test that is abnormal or we need to change your treatment, we will call you to review the results.   Testing/Procedures: None ordered   Follow-Up: At [facility], you and your health needs are our priority.  As part of our continuing mission to provide you with exceptional heart care, we have created designated Provider Care Teams.  These Care Teams include your primary Cardiologist (physician) and Advanced Practice Providers (APPs -  Physician Assistants and Nurse Practitioners) who all work together to provide you with the care you need, when you need it.  We recommend signing up for the patient portal called "MyChart".  Sign up information is provided on this After Visit Summary.  MyChart is used to connect with patients for Virtual Visits (Telemedicine).  Patients are able to view lab/test results, encounter notes, upcoming appointments, etc.  Non-urgent messages can be sent to your provider as well.   To learn more about what you can do with MyChart, go to [email or web address].    Remote monitoring is used to monitor your Pacemaker or ICD from home. This monitoring reduces the number of office visits required to check your device to one time per year. It allows [place] to keep an eye on the functioning of your device to ensure it is working properly. You are scheduled for a device check from home on 06/18/21. You may send your transmission at any time that day. If you have a  wireless device, the transmission will be sent automatically. After your physician reviews your transmission, you will receive a postcard with your next transmission date.  Your next appointment:   1 year(s)  The format for your next appointment:   In Person  Provider:   Allegra Lai, MD   Thank you for choosing [facility]!!   Trinidad Curet, RN [phone]

## 2021-04-10 NOTE — Progress Notes (Signed)
Electrophysiology Office Note   Date:  04/10/2021   ID:  Jaren, Vanetten 07-Jul-1951, MRN [id number]  PCP:  Nicoletta Dress, MD  Cardiologist:  Stanford Breed Primary Electrophysiologist:  Arash Karstens Meredith Leeds, MD    No chief complaint on file.    History of Present Illness: Nathaniel Richard is a 70 y.o. male who presents today for electrophysiology evaluation.     He has a pacemaker implanted 8 years ago at [facility].  He also has aortic valve replacement December 2014 at [facility].  He is having extreme fatigue and has pacemaker in place.  He had heart rates in the high 20s at times.  He had a hip replacement in 2013 which was uncomplicated.  He had a Medtronic generator change on 02/06/2017.  On 03/05/2017 he had surgery for a pancreatic tumor.  At the unipolar impedance alert that was thought due to his generator.  He had a generator change again on 03/17/2018.  Today, denies symptoms of palpitations, chest pain, shortness of breath, orthopnea, PND, lower extremity edema, claudication, dizziness, presyncope, syncope, bleeding, or neurologic sequela. The patient is tolerating medications without difficulties.  Since being seen he has done well.  He has no chest pain or shortness of breath.  He does have a little bit of dizziness, but it does not occur when he stands up it is not related to arrhythmia.  Past Medical History:  Diagnosis Date  . Arthritis   . Hypertension   . Pacemaker    Past Surgical History:  Procedure Laterality Date  . AORTIC VALVE REPLACEMENT    . EUS N/A 12/19/2016   Procedure: UPPER ENDOSCOPIC ULTRASOUND (EUS) RADIAL;  Surgeon: Milus Banister, MD;  Location: [facility];  Service: Endoscopy;  Laterality: N/A;  . EUS N/A 12/19/2016   Procedure: UPPER ENDOSCOPIC ULTRASOUND (EUS) LINEAR;  Surgeon: Milus Banister, MD;  [facility];  Service: Endoscopy;  Laterality: N/A;  . EYE SURGERY     bilat cataracts  . HEMORRHOID SURGERY    . INSERT /  REPLACE / REMOVE PACEMAKER    . PPM GENERATOR CHANGEOUT N/A 02/06/2017   Procedure: PPM Generator Changeout;  Surgeon: Kazden Largo Meredith Leeds, MD;  Location: [facility];  Service: Cardiovascular;  Laterality: N/A;  . PPM GENERATOR CHANGEOUT N/A 03/17/2018   Procedure: PPM GENERATOR CHANGEOUT;  Surgeon: Constance Haw, MD;  Location: [place] [facility];  Service: Cardiovascular;  Laterality: N/A;  . TOTAL HIP ARTHROPLASTY  07/29/2012   Procedure: TOTAL HIP ARTHROPLASTY;  Surgeon: Gearlean Alf, MD;  Location: [facility];  Service: Orthopedics;  Laterality: Right;     Current Outpatient Medications  Medication Sig Dispense Refill  . amLODipine (NORVASC) 5 MG tablet TAKE ONE TABLET BY MOUTH DAILY 180 tablet 1  . amoxicillin (AMOXIL) 500 MG tablet Take 2,000 mg by mouth as directed. Before dental appointments    . aspirin 81 MG tablet Take 81 mg by mouth daily.    . cetirizine (ZYRTEC) 10 MG tablet Take 10 mg by mouth daily as needed for allergies or rhinitis.     Marland Kitchen omeprazole (PRILOSEC) 20 MG capsule Take 20 mg by mouth daily as needed.     No current facility-administered medications for this visit.    Allergies:   Methocarbamol   Social History:  The patient  reports that he has never smoked. He has never used smokeless tobacco. He reports that he does not drink alcohol and does not  use drugs.   Family History:  The patient's family history includes Dementia in his mother; Diabetes in his mother; Heart failure in his father; Hypertension in his father and mother.   ROS:  Please see the history of present illness.   Otherwise, review of systems is positive for none.   All other systems are reviewed and negative.   PHYSICAL EXAM: VS:  BP 138/84   Pulse 75   Ht 5\' 9"  (1.753 m)   Wt 182 lb (82.6 kg)   BMI 26.88 kg/m  , BMI Body mass index is 26.88 kg/m. GEN: Well nourished, well developed, in no acute distress  HEENT: normal  Neck: no JVD, carotid bruits, or masses Cardiac:  RRR; no murmurs, rubs, or gallops,no edema  Respiratory:  clear to auscultation bilaterally, normal work of breathing GI: soft, nontender, nondistended, + BS MS: no deformity or atrophy  Skin: warm and dry, device site well healed Neuro:  Strength and sensation are intact Psych: euthymic mood, full affect  EKG:  EKG is ordered today. Personal review of the ekg ordered shows sinus rhythm, ventricular paced, rate 73  Personal review of the device interrogation today. Results in [place]: No results found for requested labs within last 8760 hours.    Lipid Panel  No results found for: CHOL, TRIG, HDL, CHOLHDL, VLDL, LDLCALC, LDLDIRECT   Wt Readings from Last 3 Encounters:  04/10/21 182 lb (82.6 kg)  12/06/20 182 lb (82.6 kg)  11/17/19 179 lb 1.9 oz (81.2 kg)      Other studies Reviewed: Additional studies/ records that were reviewed today include: TTE 01/29/17 Review of the above records today demonstrates:  - Left ventricle: The cavity size was normal. Wall thickness was   normal. Systolic function was normal. The estimated ejection   fraction was in the range of 60% to 65%. Wall motion was normal;   there were no regional wall motion abnormalities. Features are   consistent with a pseudonormal left ventricular filling pattern,   with concomitant abnormal relaxation and increased filling   pressure (grade 2 diastolic dysfunction). - Aortic valve: Mildly calcified annulus. Moderately thickened,   moderately calcified leaflets. There was mild stenosis. Valve   area (VTI): 0.99 cm^2. Valve area (Vmax): 0.83 cm^2. Valve area   (Vmean): 0.8 cm^2. - Mitral valve: There was mild regurgitation. - Tricuspid valve: There was mild-moderate regurgitation.   ASSESSMENT AND PLAN:  1.  Complete AV block: Status post Medtronic dual-chamber pacemaker.  Device functioning appropriately.  No changes at this time.    2.  Hypertension: Currently well controlled  3.  Aortic  valve replacement: Feeling well without issue.  With current management.  Current medicines are reviewed at length with the patient today.   The patient does not have concerns regarding his medicines.  The following changes were made today: None  Labs/ tests ordered today include:  Orders Placed This Encounter  Procedures  . EKG 12-Lead     Disposition:   FU with Dynver [place] 12 months  Signed, Jolene Guyett Meredith Leeds, MD  04/10/2021 3:22 PM     [facility] [address] [place] [address] [phone] (office) [phone] (fax)

## 2021-06-01 DIAGNOSIS — E785 Hyperlipidemia, unspecified: Secondary | ICD-10-CM | POA: Diagnosis not present

## 2021-06-01 DIAGNOSIS — Z1331 Encounter for screening for depression: Secondary | ICD-10-CM | POA: Diagnosis not present

## 2021-06-01 DIAGNOSIS — Z9181 History of falling: Secondary | ICD-10-CM | POA: Diagnosis not present

## 2021-06-01 DIAGNOSIS — Z0000 Encounter for general adult medical examination without abnormal findings: Secondary | ICD-10-CM | POA: Diagnosis not present

## 2021-06-18 ENCOUNTER — Ambulatory Visit (INDEPENDENT_AMBULATORY_CARE_PROVIDER_SITE_OTHER): Payer: PPO

## 2021-06-18 DIAGNOSIS — I442 Atrioventricular block, complete: Secondary | ICD-10-CM

## 2021-06-20 LAB — CUP PACEART REMOTE DEVICE CHECK
Battery Remaining Longevity: 91 mo
Battery Voltage: 2.99 V
Brady Statistic AP VP Percent: 4.06 %
Brady Statistic AP VS Percent: 0 %
Brady Statistic AS VP Percent: 95.82 %
Brady Statistic AS VS Percent: 0.12 %
Brady Statistic RA Percent Paced: 4.05 %
Brady Statistic RV Percent Paced: 99.88 %
Date Time Interrogation Session: 20220720155018
Implantable Lead Implant Date: 20080903
Implantable Lead Implant Date: 20080903
Implantable Lead Location: 753859
Implantable Lead Location: 753860
Implantable Lead Model: 5076
Implantable Lead Model: 5594
Implantable Pulse Generator Implant Date: 20190416
Lead Channel Impedance Value: 323 Ohm
Lead Channel Impedance Value: 361 Ohm
Lead Channel Impedance Value: 380 Ohm
Lead Channel Impedance Value: 380 Ohm
Lead Channel Pacing Threshold Amplitude: 0.5 V
Lead Channel Pacing Threshold Amplitude: 1.125 V
Lead Channel Pacing Threshold Pulse Width: 0.4 ms
Lead Channel Pacing Threshold Pulse Width: 0.4 ms
Lead Channel Sensing Intrinsic Amplitude: 16.25 mV
Lead Channel Sensing Intrinsic Amplitude: 16.25 mV
Lead Channel Sensing Intrinsic Amplitude: 2.375 mV
Lead Channel Sensing Intrinsic Amplitude: 2.375 mV
Lead Channel Setting Pacing Amplitude: 1.5 V
Lead Channel Setting Pacing Amplitude: 2.5 V
Lead Channel Setting Pacing Pulse Width: 0.4 ms
Lead Channel Setting Sensing Sensitivity: 4 mV

## 2021-06-21 DIAGNOSIS — M7989 Other specified soft tissue disorders: Secondary | ICD-10-CM | POA: Diagnosis not present

## 2021-06-21 DIAGNOSIS — M25571 Pain in right ankle and joints of right foot: Secondary | ICD-10-CM | POA: Diagnosis not present

## 2021-06-21 DIAGNOSIS — M7731 Calcaneal spur, right foot: Secondary | ICD-10-CM | POA: Diagnosis not present

## 2021-06-21 DIAGNOSIS — M19071 Primary osteoarthritis, right ankle and foot: Secondary | ICD-10-CM | POA: Diagnosis not present

## 2021-06-26 DIAGNOSIS — H6123 Impacted cerumen, bilateral: Secondary | ICD-10-CM | POA: Diagnosis not present

## 2021-06-26 DIAGNOSIS — I10 Essential (primary) hypertension: Secondary | ICD-10-CM | POA: Diagnosis not present

## 2021-06-26 DIAGNOSIS — D519 Vitamin B12 deficiency anemia, unspecified: Secondary | ICD-10-CM | POA: Diagnosis not present

## 2021-07-10 NOTE — Progress Notes (Signed)
Remote pacemaker transmission.   

## 2021-07-25 DIAGNOSIS — R2689 Other abnormalities of gait and mobility: Secondary | ICD-10-CM | POA: Diagnosis not present

## 2021-07-25 DIAGNOSIS — R269 Unspecified abnormalities of gait and mobility: Secondary | ICD-10-CM | POA: Diagnosis not present

## 2021-07-25 DIAGNOSIS — R4189 Other symptoms and signs involving cognitive functions and awareness: Secondary | ICD-10-CM | POA: Diagnosis not present

## 2021-07-31 DIAGNOSIS — Z8507 Personal history of malignant neoplasm of pancreas: Secondary | ICD-10-CM | POA: Diagnosis not present

## 2021-07-31 DIAGNOSIS — R2689 Other abnormalities of gait and mobility: Secondary | ICD-10-CM | POA: Diagnosis not present

## 2021-07-31 DIAGNOSIS — G4700 Insomnia, unspecified: Secondary | ICD-10-CM | POA: Diagnosis not present

## 2021-07-31 DIAGNOSIS — R251 Tremor, unspecified: Secondary | ICD-10-CM | POA: Diagnosis not present

## 2021-07-31 DIAGNOSIS — R4189 Other symptoms and signs involving cognitive functions and awareness: Secondary | ICD-10-CM | POA: Diagnosis not present

## 2021-08-21 DIAGNOSIS — R269 Unspecified abnormalities of gait and mobility: Secondary | ICD-10-CM | POA: Diagnosis not present

## 2021-08-21 DIAGNOSIS — R7301 Impaired fasting glucose: Secondary | ICD-10-CM | POA: Diagnosis not present

## 2021-08-21 DIAGNOSIS — Z6826 Body mass index (BMI) 26.0-26.9, adult: Secondary | ICD-10-CM | POA: Diagnosis not present

## 2021-08-21 DIAGNOSIS — K219 Gastro-esophageal reflux disease without esophagitis: Secondary | ICD-10-CM | POA: Diagnosis not present

## 2021-08-21 DIAGNOSIS — E785 Hyperlipidemia, unspecified: Secondary | ICD-10-CM | POA: Diagnosis not present

## 2021-08-21 DIAGNOSIS — R413 Other amnesia: Secondary | ICD-10-CM | POA: Diagnosis not present

## 2021-08-21 DIAGNOSIS — R2689 Other abnormalities of gait and mobility: Secondary | ICD-10-CM | POA: Diagnosis not present

## 2021-08-21 DIAGNOSIS — I10 Essential (primary) hypertension: Secondary | ICD-10-CM | POA: Diagnosis not present

## 2021-08-21 DIAGNOSIS — D519 Vitamin B12 deficiency anemia, unspecified: Secondary | ICD-10-CM | POA: Diagnosis not present

## 2021-09-17 ENCOUNTER — Ambulatory Visit (INDEPENDENT_AMBULATORY_CARE_PROVIDER_SITE_OTHER): Payer: PPO

## 2021-09-17 DIAGNOSIS — I442 Atrioventricular block, complete: Secondary | ICD-10-CM | POA: Diagnosis not present

## 2021-09-19 LAB — CUP PACEART REMOTE DEVICE CHECK
Battery Remaining Longevity: 90 mo
Battery Voltage: 2.99 V
Brady Statistic AP VP Percent: 6.11 %
Brady Statistic AP VS Percent: 0 %
Brady Statistic AS VP Percent: 93.26 %
Brady Statistic AS VS Percent: 0.62 %
Brady Statistic RA Percent Paced: 6.23 %
Brady Statistic RV Percent Paced: 99.38 %
Date Time Interrogation Session: 20221017022814
Implantable Lead Implant Date: 20080903
Implantable Lead Implant Date: 20080903
Implantable Lead Location: 753859
Implantable Lead Location: 753860
Implantable Lead Model: 5076
Implantable Lead Model: 5594
Implantable Pulse Generator Implant Date: 20190416
Lead Channel Impedance Value: 323 Ohm
Lead Channel Impedance Value: 361 Ohm
Lead Channel Impedance Value: 399 Ohm
Lead Channel Impedance Value: 399 Ohm
Lead Channel Pacing Threshold Amplitude: 0.5 V
Lead Channel Pacing Threshold Amplitude: 1 V
Lead Channel Pacing Threshold Pulse Width: 0.4 ms
Lead Channel Pacing Threshold Pulse Width: 0.4 ms
Lead Channel Sensing Intrinsic Amplitude: 17.375 mV
Lead Channel Sensing Intrinsic Amplitude: 17.375 mV
Lead Channel Sensing Intrinsic Amplitude: 2.875 mV
Lead Channel Sensing Intrinsic Amplitude: 2.875 mV
Lead Channel Setting Pacing Amplitude: 1.5 V
Lead Channel Setting Pacing Amplitude: 2.5 V
Lead Channel Setting Pacing Pulse Width: 0.4 ms
Lead Channel Setting Sensing Sensitivity: 4 mV

## 2021-09-26 NOTE — Progress Notes (Signed)
Remote pacemaker transmission.   

## 2021-10-04 DIAGNOSIS — R21 Rash and other nonspecific skin eruption: Secondary | ICD-10-CM | POA: Diagnosis not present

## 2021-10-04 DIAGNOSIS — T63421A Toxic effect of venom of ants, accidental (unintentional), initial encounter: Secondary | ICD-10-CM | POA: Diagnosis not present

## 2021-10-09 DIAGNOSIS — G4700 Insomnia, unspecified: Secondary | ICD-10-CM | POA: Diagnosis not present

## 2021-10-09 DIAGNOSIS — Z72821 Inadequate sleep hygiene: Secondary | ICD-10-CM | POA: Diagnosis not present

## 2021-10-09 DIAGNOSIS — G4709 Other insomnia: Secondary | ICD-10-CM | POA: Diagnosis not present

## 2021-10-16 DIAGNOSIS — N529 Male erectile dysfunction, unspecified: Secondary | ICD-10-CM | POA: Diagnosis not present

## 2021-10-16 DIAGNOSIS — R270 Ataxia, unspecified: Secondary | ICD-10-CM | POA: Diagnosis not present

## 2021-10-16 DIAGNOSIS — Z8507 Personal history of malignant neoplasm of pancreas: Secondary | ICD-10-CM | POA: Diagnosis not present

## 2021-10-16 DIAGNOSIS — Z950 Presence of cardiac pacemaker: Secondary | ICD-10-CM | POA: Diagnosis not present

## 2021-10-16 DIAGNOSIS — R4189 Other symptoms and signs involving cognitive functions and awareness: Secondary | ICD-10-CM | POA: Diagnosis not present

## 2021-10-16 DIAGNOSIS — G4700 Insomnia, unspecified: Secondary | ICD-10-CM | POA: Diagnosis not present

## 2021-10-16 DIAGNOSIS — H518 Other specified disorders of binocular movement: Secondary | ICD-10-CM | POA: Diagnosis not present

## 2021-10-16 DIAGNOSIS — Z8603 Personal history of neoplasm of uncertain behavior: Secondary | ICD-10-CM | POA: Diagnosis not present

## 2021-10-16 DIAGNOSIS — R258 Other abnormal involuntary movements: Secondary | ICD-10-CM | POA: Diagnosis not present

## 2021-10-16 DIAGNOSIS — R2689 Other abnormalities of gait and mobility: Secondary | ICD-10-CM | POA: Diagnosis not present

## 2021-10-16 DIAGNOSIS — F419 Anxiety disorder, unspecified: Secondary | ICD-10-CM | POA: Diagnosis not present

## 2021-10-16 DIAGNOSIS — R251 Tremor, unspecified: Secondary | ICD-10-CM | POA: Diagnosis not present

## 2021-11-01 DIAGNOSIS — M62830 Muscle spasm of back: Secondary | ICD-10-CM | POA: Diagnosis not present

## 2021-11-21 DIAGNOSIS — R413 Other amnesia: Secondary | ICD-10-CM | POA: Diagnosis not present

## 2021-11-21 DIAGNOSIS — K219 Gastro-esophageal reflux disease without esophagitis: Secondary | ICD-10-CM | POA: Diagnosis not present

## 2021-11-21 DIAGNOSIS — R269 Unspecified abnormalities of gait and mobility: Secondary | ICD-10-CM | POA: Diagnosis not present

## 2021-11-21 DIAGNOSIS — R7301 Impaired fasting glucose: Secondary | ICD-10-CM | POA: Diagnosis not present

## 2021-11-21 DIAGNOSIS — E785 Hyperlipidemia, unspecified: Secondary | ICD-10-CM | POA: Diagnosis not present

## 2021-11-21 DIAGNOSIS — D519 Vitamin B12 deficiency anemia, unspecified: Secondary | ICD-10-CM | POA: Diagnosis not present

## 2021-11-21 DIAGNOSIS — R2689 Other abnormalities of gait and mobility: Secondary | ICD-10-CM | POA: Diagnosis not present

## 2021-11-21 DIAGNOSIS — I10 Essential (primary) hypertension: Secondary | ICD-10-CM | POA: Diagnosis not present

## 2021-11-23 DIAGNOSIS — E875 Hyperkalemia: Secondary | ICD-10-CM | POA: Diagnosis not present

## 2021-12-17 ENCOUNTER — Ambulatory Visit (INDEPENDENT_AMBULATORY_CARE_PROVIDER_SITE_OTHER): Payer: Medicare Other

## 2021-12-17 DIAGNOSIS — I442 Atrioventricular block, complete: Secondary | ICD-10-CM | POA: Diagnosis not present

## 2021-12-18 LAB — CUP PACEART REMOTE DEVICE CHECK
Battery Remaining Longevity: 87 mo
Battery Voltage: 2.98 V
Brady Statistic AP VP Percent: 3.86 %
Brady Statistic AP VS Percent: 0 %
Brady Statistic AS VP Percent: 95.88 %
Brady Statistic AS VS Percent: 0.25 %
Brady Statistic RA Percent Paced: 3.88 %
Brady Statistic RV Percent Paced: 99.74 %
Date Time Interrogation Session: 20230116012830
Implantable Lead Implant Date: 20080903
Implantable Lead Implant Date: 20080903
Implantable Lead Location: 753859
Implantable Lead Location: 753860
Implantable Lead Model: 5076
Implantable Lead Model: 5594
Implantable Pulse Generator Implant Date: 20190416
Lead Channel Impedance Value: 361 Ohm
Lead Channel Impedance Value: 380 Ohm
Lead Channel Impedance Value: 399 Ohm
Lead Channel Impedance Value: 418 Ohm
Lead Channel Pacing Threshold Amplitude: 0.5 V
Lead Channel Pacing Threshold Amplitude: 1.25 V
Lead Channel Pacing Threshold Pulse Width: 0.4 ms
Lead Channel Pacing Threshold Pulse Width: 0.4 ms
Lead Channel Sensing Intrinsic Amplitude: 16.625 mV
Lead Channel Sensing Intrinsic Amplitude: 16.625 mV
Lead Channel Sensing Intrinsic Amplitude: 3.25 mV
Lead Channel Sensing Intrinsic Amplitude: 3.25 mV
Lead Channel Setting Pacing Amplitude: 1.5 V
Lead Channel Setting Pacing Amplitude: 2.5 V
Lead Channel Setting Pacing Pulse Width: 0.4 ms
Lead Channel Setting Sensing Sensitivity: 4 mV

## 2021-12-27 NOTE — Progress Notes (Signed)
Remote pacemaker transmission.   

## 2022-01-10 NOTE — Progress Notes (Signed)
HPI: FU AVR. Also with prior PM. Had aortic valve replacement in December 2014 at [facility]. CTA December 2017 showed 4.2 cm thoracic aortic aneurysm unchanged from previous. There was a 13 mm abnormality in the pancreas and MRI recommended. Now followed by oncology.  Carotid Dopplers February 2018 showed no significant stenosis.  Follow-up CT of the liver in January 2021.  The thoracic aorta was measured at 4.3 cm unchanged compared to previous.  Echocardiogram March 2022 showed normal LV function, severe hypertrophy of the basal septum, grade 2 diastolic dysfunction, status post AVR with mean gradient 18 mmHg no aortic insufficiency, dilated ascending aorta at 44 mm.  Since last seen, he has noticed some increased dyspnea on exertion.  He denies orthopnea, PND, pedal edema, chest pain or syncope.  Current Outpatient Medications  Medication Sig Dispense Refill   Amantadine HCl 100 MG tablet Take 1 tablet by mouth in the morning and at bedtime.     amLODipine (NORVASC) 5 MG tablet TAKE ONE TABLET BY MOUTH DAILY 180 tablet 1   amoxicillin (AMOXIL) 500 MG tablet Take 2,000 mg by mouth as directed. Before dental appointments     aspirin 81 MG tablet Take 81 mg by mouth daily.     cetirizine (ZYRTEC) 10 MG tablet Take 10 mg by mouth daily as needed for allergies or rhinitis.      cyanocobalamin (,VITAMIN B-12,) 1000 MCG/ML injection Inject 1 mL into the muscle every 30 (thirty) days.     omeprazole (PRILOSEC) 20 MG capsule Take 20 mg by mouth daily as needed.     traZODone (DESYREL) 50 MG tablet Take 25 mg by mouth at bedtime.     No current facility-administered medications for this visit.     Past Medical History:  Diagnosis Date   Arthritis    Hypertension    Pacemaker     Past Surgical History:  Procedure Laterality Date   AORTIC VALVE REPLACEMENT     EUS N/A 12/19/2016   Procedure: UPPER ENDOSCOPIC ULTRASOUND (EUS) RADIAL;  Surgeon: Milus Banister, MD;  [facility];  Service: Endoscopy;  Laterality: N/A;   EUS N/A 12/19/2016   Procedure: UPPER ENDOSCOPIC ULTRASOUND (EUS) LINEAR;  Surgeon: Milus Banister, MD;  [facility];  Service: Endoscopy;  Laterality: N/A;   EYE SURGERY     bilat cataracts   HEMORRHOID SURGERY     INSERT / REPLACE / [place] N/A 02/06/2017   Procedure: PPM Generator Changeout;  Surgeon: Will Meredith Leeds, MD;  Location: [facility];  Service: Cardiovascular;  Laterality: N/A;   PPM GENERATOR CHANGEOUT N/A 03/17/2018   Procedure: PPM GENERATOR CHANGEOUT;  Surgeon: Constance Haw, MD;  Location: [facility];  Service: Cardiovascular;  Laterality: N/A;   TOTAL HIP ARTHROPLASTY  07/29/2012   Procedure: TOTAL HIP ARTHROPLASTY;  Surgeon: Gearlean Alf, MD;  [facility];  Service: Orthopedics;  Laterality: Right;    Social History   Socioeconomic History   Marital status: Married    Spouse name: Not on file   Number of children: 2   Years of education: Not on file   Highest education level: Not on file  Occupational History   Not on file  Tobacco Use   Smoking status: Never   Smokeless tobacco: Never  Vaping Use   Vaping Use: Never used  Substance and Sexual Activity   Alcohol use: No  Drug use: No   Sexual activity: Not on file  Other Topics Concern   Not on file  Social History Narrative   Married, wife Jeani Hawking   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   Goes to [employer] daily and enjoys golf   Social Determinants of Radio broadcast assistant Strain: Not on [employer] Insecurity: Not on file  Transportation Needs: Not on file  Physical Activity: Not on file  Stress: Not on file  Social Connections: Not on file  Intimate Partner Violence: Not on file    Family History  Problem Relation Age of Onset   Diabetes Mother    Hypertension Mother    Dementia Mother    Heart failure Father    Hypertension Father     ROS:  Some difficulties with memory and balance but no fevers or chills, productive cough, hemoptysis, dysphasia, odynophagia, melena, hematochezia, dysuria, hematuria, rash, seizure activity, orthopnea, PND, pedal edema, claudication. Remaining systems are negative.  Physical Exam: Well-developed well-nourished in no acute distress.  Skin is warm and dry.  HEENT is normal.  Neck is supple.  Chest is clear to auscultation with normal expansion.  Cardiovascular exam is regular rate and rhythm.  2/6 systolic murmur left sternal border.  No diastolic murmur. Abdominal exam nontender or distended. No masses palpated. Extremities show no edema. neuro grossly intact  ECG-normal sinus rhythm with ventricular pacing.  Personally reviewed  A/P  1 status post aortic valve replacement-Continue SBE prophylaxis.  He is describing some increased dyspnea on exertion.  We will repeat echocardiogram.  2 dilated thoracic aorta-we will arrange follow-up CTA.  3 hypertension-blood pressure controlled.  Given small thoracic aortic aneurysm I will discontinue amlodipine and instead treat with Avapro 150 mg daily.  Check potassium and renal function in 1 week.  4 status post pacemaker-followed by electrophysiology.  Kirk Ruths, MD

## 2022-01-22 DIAGNOSIS — R270 Ataxia, unspecified: Secondary | ICD-10-CM | POA: Diagnosis not present

## 2022-01-22 DIAGNOSIS — G4700 Insomnia, unspecified: Secondary | ICD-10-CM | POA: Diagnosis not present

## 2022-01-22 DIAGNOSIS — G319 Degenerative disease of nervous system, unspecified: Secondary | ICD-10-CM | POA: Diagnosis not present

## 2022-01-22 DIAGNOSIS — R4189 Other symptoms and signs involving cognitive functions and awareness: Secondary | ICD-10-CM | POA: Diagnosis not present

## 2022-01-23 ENCOUNTER — Encounter: Payer: Self-pay | Admitting: Cardiology

## 2022-01-23 ENCOUNTER — Other Ambulatory Visit: Payer: Self-pay

## 2022-01-23 ENCOUNTER — Ambulatory Visit: Payer: PPO | Admitting: Cardiology

## 2022-01-23 VITALS — BP 132/86 | HR 70 | Ht 69.0 in | Wt 176.1 lb

## 2022-01-23 DIAGNOSIS — Z952 Presence of prosthetic heart valve: Secondary | ICD-10-CM | POA: Diagnosis not present

## 2022-01-23 DIAGNOSIS — Z950 Presence of cardiac pacemaker: Secondary | ICD-10-CM

## 2022-01-23 DIAGNOSIS — I10 Essential (primary) hypertension: Secondary | ICD-10-CM | POA: Diagnosis not present

## 2022-01-23 DIAGNOSIS — I712 Thoracic aortic aneurysm, without rupture, unspecified: Secondary | ICD-10-CM | POA: Diagnosis not present

## 2022-01-23 MED ORDER — IRBESARTAN 150 MG PO TABS: 150.0000 mg | ORAL_TABLET | Freq: Every day | ORAL | 3 refills | Status: DC

## 2022-01-23 NOTE — Patient Instructions (Signed)
Medication Instructions:   STOP AMLODIPINE   START IRBESARTAN 150 MG ONCE DAILY  *If you need a refill on your cardiac medications before your next appointment, please call your pharmacy*   Lab Work:  Your physician recommends that you return for lab work in: ONE WEEK-DO NOT NEED TO FAST  If you have labs (blood work) drawn today and your tests are completely normal, you will receive your results only by: [place] (if you have MyChart) OR A paper copy in the mail If you have any lab test that is abnormal or we need to change your treatment, we will call you to review the results.   Testing/Procedures:  Your physician has requested that you have an echocardiogram. Echocardiography is a painless test that uses sound waves to create images of your heart. It provides your doctor with information about the size and shape of your heart and how well your hearts chambers and valves are working. This procedure takes approximately one hour. There are no restrictions for this procedure. [facility] [facility]-1 ST FLOOR IMAGING DEPARTMENT  CTA OF THE CHEST TO FOLLOW UP ON THORACIC ANEURYSM AT THE [facility] [facility]-1 ST FLOOR IMAGING    Follow-Up: At [facility], you and your health needs are our priority.  As part of our continuing mission to provide you with exceptional heart care, we have created designated Provider Care Teams.  These Care Teams include your primary Cardiologist (physician) and Advanced Practice Providers (APPs -  Physician Assistants and Nurse Practitioners) who all work together to provide you with the care you need, when you need it.  We recommend signing up for the patient portal called "MyChart".  Sign up information is provided on this After Visit Summary.  MyChart is used to connect with patients for Virtual Visits (Telemedicine).  Patients are able to view lab/test results, encounter notes, upcoming appointments, etc.  Non-urgent messages can be sent to your  provider as well.   To learn more about what you can do with MyChart, go to [email or web address].    Your next appointment:   12 month(s)  The format for your next appointment:   In Person  Provider:   Kirk Ruths, MD

## 2022-01-30 DIAGNOSIS — I10 Essential (primary) hypertension: Secondary | ICD-10-CM | POA: Diagnosis not present

## 2022-01-31 ENCOUNTER — Telehealth: Payer: Self-pay | Admitting: *Deleted

## 2022-01-31 DIAGNOSIS — I10 Essential (primary) hypertension: Secondary | ICD-10-CM

## 2022-01-31 LAB — BASIC METABOLIC PANEL
BUN/Creatinine Ratio: 13 (ref 10–24)
BUN: 17 mg/dL (ref 8–27)
CO2: 23 mmol/L (ref 20–29)
Calcium: 9.6 mg/dL (ref 8.6–10.2)
Chloride: 100 mmol/L (ref 96–106)
Creatinine, Ser: 1.29 mg/dL — ABNORMAL HIGH (ref 0.76–1.27)
Glucose: 111 mg/dL — ABNORMAL HIGH (ref 70–99)
Potassium: 5.6 mmol/L — ABNORMAL HIGH (ref 3.5–5.2)
Sodium: 136 mmol/L (ref 134–144)
eGFR: 59 mL/min/{1.73_m2} — ABNORMAL LOW (ref >59)

## 2022-01-31 NOTE — Telephone Encounter (Signed)
-----   Message from Lelon Perla, MD sent at 01/31/2022  7:24 AM EST ----- ?Low k diet; repeat bmet ?Kirk Ruths ? ?

## 2022-01-31 NOTE — Telephone Encounter (Signed)
Spoke with pt, Aware of dr crenshaw's recommendations.  Lab orders mailed to the pt  

## 2022-02-06 DIAGNOSIS — I10 Essential (primary) hypertension: Secondary | ICD-10-CM | POA: Diagnosis not present

## 2022-02-06 LAB — BASIC METABOLIC PANEL
BUN/Creatinine Ratio: 14 (ref 10–24)
BUN: 20 mg/dL (ref 8–27)
CO2: 18 mmol/L — ABNORMAL LOW (ref 20–29)
Calcium: 9.8 mg/dL (ref 8.6–10.2)
Chloride: 100 mmol/L (ref 96–106)
Creatinine, Ser: 1.46 mg/dL — ABNORMAL HIGH (ref 0.76–1.27)
Glucose: 136 mg/dL — ABNORMAL HIGH (ref 70–99)
Potassium: 4.8 mmol/L (ref 3.5–5.2)
Sodium: 136 mmol/L (ref 134–144)
eGFR: 51 mL/min/{1.73_m2} — ABNORMAL LOW (ref >59)

## 2022-02-07 ENCOUNTER — Other Ambulatory Visit: Payer: Self-pay

## 2022-02-07 DIAGNOSIS — I10 Essential (primary) hypertension: Secondary | ICD-10-CM

## 2022-02-13 ENCOUNTER — Other Ambulatory Visit: Payer: Self-pay

## 2022-02-13 ENCOUNTER — Ambulatory Visit (HOSPITAL_BASED_OUTPATIENT_CLINIC_OR_DEPARTMENT_OTHER)
Admission: RE | Admit: 2022-02-13 | Discharge: 2022-02-13 | Disposition: A | Discharge status: Home / Self Care | Payer: PPO | Source: Ambulatory Visit | Attending: Cardiology | Admitting: Cardiology

## 2022-02-13 ENCOUNTER — Encounter (HOSPITAL_BASED_OUTPATIENT_CLINIC_OR_DEPARTMENT_OTHER): Payer: Self-pay

## 2022-02-13 DIAGNOSIS — Z952 Presence of prosthetic heart valve: Secondary | ICD-10-CM | POA: Diagnosis not present

## 2022-02-13 DIAGNOSIS — I712 Thoracic aortic aneurysm, without rupture, unspecified: Secondary | ICD-10-CM | POA: Insufficient documentation

## 2022-02-13 DIAGNOSIS — I82B22 Chronic embolism and thrombosis of left subclavian vein: Secondary | ICD-10-CM | POA: Diagnosis not present

## 2022-02-13 LAB — ECHOCARDIOGRAM COMPLETE
AV Mean grad: 13 mmHg
AV Peak grad: 22.8 mmHg
Ao pk vel: 2.39 m/s
Area-P 1/2: 4.83 cm2
S' Lateral: 2.6 cm

## 2022-02-13 MED ORDER — IOHEXOL 350 MG/ML SOLN
100.0000 mL | Freq: Once | INTRAVENOUS | Status: AC | PRN
  Administered 2022-02-13: 100 mL via INTRAVENOUS

## 2022-02-13 NOTE — Progress Notes (Signed)
?  Echocardiogram ?2D Echocardiogram has been performed. ? ?Nathaniel Richard ?02/13/2022, 11:47 AM ?

## 2022-02-21 DIAGNOSIS — E785 Hyperlipidemia, unspecified: Secondary | ICD-10-CM | POA: Diagnosis not present

## 2022-02-21 DIAGNOSIS — R413 Other amnesia: Secondary | ICD-10-CM | POA: Diagnosis not present

## 2022-02-21 DIAGNOSIS — I10 Essential (primary) hypertension: Secondary | ICD-10-CM | POA: Diagnosis not present

## 2022-02-21 DIAGNOSIS — K219 Gastro-esophageal reflux disease without esophagitis: Secondary | ICD-10-CM | POA: Diagnosis not present

## 2022-02-21 DIAGNOSIS — Z125 Encounter for screening for malignant neoplasm of prostate: Secondary | ICD-10-CM | POA: Diagnosis not present

## 2022-02-21 DIAGNOSIS — D519 Vitamin B12 deficiency anemia, unspecified: Secondary | ICD-10-CM | POA: Diagnosis not present

## 2022-02-21 DIAGNOSIS — R269 Unspecified abnormalities of gait and mobility: Secondary | ICD-10-CM | POA: Diagnosis not present

## 2022-02-21 DIAGNOSIS — K900 Celiac disease: Secondary | ICD-10-CM | POA: Diagnosis not present

## 2022-02-21 DIAGNOSIS — R2689 Other abnormalities of gait and mobility: Secondary | ICD-10-CM | POA: Diagnosis not present

## 2022-02-21 DIAGNOSIS — D3A8 Other benign neuroendocrine tumors: Secondary | ICD-10-CM | POA: Diagnosis not present

## 2022-02-21 DIAGNOSIS — R7301 Impaired fasting glucose: Secondary | ICD-10-CM | POA: Diagnosis not present

## 2022-03-05 ENCOUNTER — Other Ambulatory Visit: Payer: Self-pay | Admitting: *Deleted

## 2022-03-05 ENCOUNTER — Encounter: Payer: Self-pay | Admitting: *Deleted

## 2022-03-05 DIAGNOSIS — I10 Essential (primary) hypertension: Secondary | ICD-10-CM

## 2022-03-15 ENCOUNTER — Encounter: Payer: Self-pay | Admitting: Cardiology

## 2022-03-18 ENCOUNTER — Ambulatory Visit (INDEPENDENT_AMBULATORY_CARE_PROVIDER_SITE_OTHER): Payer: PPO

## 2022-03-18 DIAGNOSIS — I442 Atrioventricular block, complete: Secondary | ICD-10-CM

## 2022-03-20 LAB — CUP PACEART REMOTE DEVICE CHECK
Battery Remaining Longevity: 85 mo
Battery Voltage: 2.98 V
Brady Statistic AP VP Percent: 7.42 %
Brady Statistic AP VS Percent: 0.01 %
Brady Statistic AS VP Percent: 91.32 %
Brady Statistic AS VS Percent: 1.25 %
Brady Statistic RA Percent Paced: 7.78 %
Brady Statistic RV Percent Paced: 98.74 %
Date Time Interrogation Session: 20230417022926
Implantable Lead Implant Date: 20080903
Implantable Lead Implant Date: 20080903
Implantable Lead Location: 753859
Implantable Lead Location: 753860
Implantable Lead Model: 5076
Implantable Lead Model: 5594
Implantable Pulse Generator Implant Date: 20190416
Lead Channel Impedance Value: 361 Ohm
Lead Channel Impedance Value: 380 Ohm
Lead Channel Impedance Value: 418 Ohm
Lead Channel Impedance Value: 418 Ohm
Lead Channel Pacing Threshold Amplitude: 0.5 V
Lead Channel Pacing Threshold Amplitude: 1.125 V
Lead Channel Pacing Threshold Pulse Width: 0.4 ms
Lead Channel Pacing Threshold Pulse Width: 0.4 ms
Lead Channel Sensing Intrinsic Amplitude: 18.125 mV
Lead Channel Sensing Intrinsic Amplitude: 18.125 mV
Lead Channel Sensing Intrinsic Amplitude: 3.125 mV
Lead Channel Sensing Intrinsic Amplitude: 3.125 mV
Lead Channel Setting Pacing Amplitude: 1.5 V
Lead Channel Setting Pacing Amplitude: 2.5 V
Lead Channel Setting Pacing Pulse Width: 0.4 ms
Lead Channel Setting Sensing Sensitivity: 4 mV

## 2022-04-05 NOTE — Progress Notes (Signed)
Remote pacemaker transmission.   

## 2022-05-08 ENCOUNTER — Encounter: Payer: Self-pay | Admitting: Cardiology

## 2022-05-13 ENCOUNTER — Encounter: Payer: Self-pay | Admitting: Cardiology

## 2022-05-27 DIAGNOSIS — R413 Other amnesia: Secondary | ICD-10-CM | POA: Diagnosis not present

## 2022-05-27 DIAGNOSIS — R269 Unspecified abnormalities of gait and mobility: Secondary | ICD-10-CM | POA: Diagnosis not present

## 2022-05-27 DIAGNOSIS — E785 Hyperlipidemia, unspecified: Secondary | ICD-10-CM | POA: Diagnosis not present

## 2022-05-27 DIAGNOSIS — D519 Vitamin B12 deficiency anemia, unspecified: Secondary | ICD-10-CM | POA: Diagnosis not present

## 2022-05-27 DIAGNOSIS — R2689 Other abnormalities of gait and mobility: Secondary | ICD-10-CM | POA: Diagnosis not present

## 2022-05-27 DIAGNOSIS — D3A8 Other benign neuroendocrine tumors: Secondary | ICD-10-CM | POA: Diagnosis not present

## 2022-05-27 DIAGNOSIS — K219 Gastro-esophageal reflux disease without esophagitis: Secondary | ICD-10-CM | POA: Diagnosis not present

## 2022-05-27 DIAGNOSIS — R7301 Impaired fasting glucose: Secondary | ICD-10-CM | POA: Diagnosis not present

## 2022-05-27 DIAGNOSIS — I10 Essential (primary) hypertension: Secondary | ICD-10-CM | POA: Diagnosis not present

## 2022-05-28 DIAGNOSIS — E875 Hyperkalemia: Secondary | ICD-10-CM | POA: Diagnosis not present

## 2022-06-03 ENCOUNTER — Encounter: Payer: Self-pay | Admitting: Cardiology

## 2022-06-03 ENCOUNTER — Ambulatory Visit (INDEPENDENT_AMBULATORY_CARE_PROVIDER_SITE_OTHER): Payer: PPO | Admitting: Cardiology

## 2022-06-03 VITALS — BP 126/80 | HR 78 | Ht 69.0 in | Wt 178.8 lb

## 2022-06-03 DIAGNOSIS — Z950 Presence of cardiac pacemaker: Secondary | ICD-10-CM

## 2022-06-03 DIAGNOSIS — I442 Atrioventricular block, complete: Secondary | ICD-10-CM | POA: Diagnosis not present

## 2022-06-03 NOTE — Progress Notes (Signed)
Electrophysiology Office Note   Date:  06/03/2022   ID:  Nathaniel Richard, Nathaniel Richard 1951-06-08, MRN [id number]  PCP:  Nicoletta Dress, MD  Cardiologist:  Stanford Breed Primary Electrophysiologist:  Lamark Schue Meredith Leeds, MD    No chief complaint on file.    History of Present Illness: Nathaniel Richard is a 71 y.o. male who presents today for electrophysiology evaluation.     He has a history of complete heart block status post Medtronic pacemaker.  He also has aortic valve replacement December 2014 at [facility].  He was having fatigue prior to his pacemaker implant.  He had a hip replacement in 2013 which was uncomplicated.  He had a Medtronic generator change 02/06/2017.  On 03/09/2017 he had surgery for pancreatic tumors.  He had unipolar impedance alert and was thought due to his generator.  He had a generator change again 03/17/2018.  Today, denies symptoms of palpitations, chest pain, shortness of breath, orthopnea, PND, lower extremity edema, claudication, dizziness, presyncope, syncope, bleeding, or neurologic sequela. The patient is tolerating medications without difficulties.    Past Medical History:  Diagnosis Date   Arthritis    Hypertension    Pacemaker    Past Surgical History:  Procedure Laterality Date   AORTIC VALVE REPLACEMENT     EUS N/A 12/19/2016   Procedure: UPPER ENDOSCOPIC ULTRASOUND (EUS) RADIAL;  Surgeon: Milus Banister, MD;  [facility];  Service: Endoscopy;  Laterality: N/A;   EUS N/A 12/19/2016   Procedure: UPPER ENDOSCOPIC ULTRASOUND (EUS) LINEAR;  Surgeon: Milus Banister, MD;  [facility];  Service: Endoscopy;  Laterality: N/A;   EYE SURGERY     bilat cataracts   HEMORRHOID SURGERY     INSERT / REPLACE / [place] N/A 02/06/2017   Procedure: PPM Generator Changeout;  Surgeon: Dai Apel Meredith Leeds, MD;  Location: [place] [facility];  Service: Cardiovascular;  Laterality: N/A;   PPM GENERATOR CHANGEOUT N/A  03/17/2018   Procedure: PPM GENERATOR CHANGEOUT;  Surgeon: Constance Haw, MD;  Location: [place] [facility];  Service: Cardiovascular;  Laterality: N/A;   TOTAL HIP ARTHROPLASTY  07/29/2012   Procedure: TOTAL HIP ARTHROPLASTY;  Surgeon: Gearlean Alf, MD;  [facility];  Service: Orthopedics;  Laterality: Right;     Current Outpatient Medications  Medication Sig Dispense Refill   Amantadine HCl 100 MG tablet Take 1 tablet by mouth in the morning and at bedtime.     amLODipine (NORVASC) 5 MG tablet Take 5 mg by mouth daily.     amoxicillin (AMOXIL) 500 MG tablet Take 2,000 mg by mouth as directed. Before dental appointments     aspirin 81 MG tablet Take 81 mg by mouth daily.     cetirizine (ZYRTEC) 10 MG tablet Take 10 mg by mouth daily as needed for allergies or rhinitis.      cyanocobalamin (,VITAMIN B-12,) 1000 MCG/ML injection Inject 1 mL into the muscle every 30 (thirty) days.     omeprazole (PRILOSEC) 20 MG capsule Take 20 mg by mouth daily as needed.     traZODone (DESYREL) 50 MG tablet Take 25 mg by mouth at bedtime.     No current facility-administered medications for this visit.    Allergies:   Methocarbamol   Social History:  The patient  reports that he has never smoked. He has never used smokeless tobacco. He reports that he does not drink alcohol and does not  use drugs.   Family History:  The patient's family history includes Dementia in his mother; Diabetes in his mother; Heart failure in his father; Hypertension in his father and mother.   ROS:  Please see the history of present illness.   Otherwise, review of systems is positive for none.   All other systems are reviewed and negative.   PHYSICAL EXAM: VS:  BP 126/80   Pulse 78   Ht '5\' 9"'$  (1.753 m)   Wt 178 lb 12.8 oz (81.1 kg)   SpO2 98%   BMI 26.40 kg/m  , BMI Body mass index is 26.4 kg/m. GEN: Well nourished, well developed, in no acute distress  HEENT: normal  Neck: no JVD, carotid bruits, or  masses Cardiac: RRR; no murmurs, rubs, or gallops,no edema  Respiratory:  clear to auscultation bilaterally, normal work of breathing GI: soft, nontender, nondistended, + BS MS: no deformity or atrophy  Skin: warm and dry, device site well healed Neuro:  Strength and sensation are intact Psych: euthymic mood, full affect  EKG:  EKG is not ordered today. Personal review of the ekg ordered 01/23/22 shows A sense, V pace  Personal review of the device interrogation today. Results in [place]: 02/06/2022: BUN 20; Creatinine, Ser 1.46; Potassium 4.8; Sodium 136    Lipid Panel  No results found for: "CHOL", "TRIG", "HDL", "CHOLHDL", "VLDL", "LDLCALC", "LDLDIRECT"   Wt Readings from Last 3 Encounters:  06/03/22 178 lb 12.8 oz (81.1 kg)  01/23/22 176 lb 1.9 oz (79.9 kg)  04/10/21 182 lb (82.6 kg)      Other studies Reviewed: Additional studies/ records that were reviewed today include: TTE 01/29/17 Review of the above records today demonstrates:  - Left ventricle: The cavity size was normal. Wall thickness was   normal. Systolic function was normal. The estimated ejection   fraction was in the range of 60% to 65%. Wall motion was normal;   there were no regional wall motion abnormalities. Features are   consistent with a pseudonormal left ventricular filling pattern,   with concomitant abnormal relaxation and increased filling   pressure (grade 2 diastolic dysfunction). - Aortic valve: Mildly calcified annulus. Moderately thickened,   moderately calcified leaflets. There was mild stenosis. Valve   area (VTI): 0.99 cm^2. Valve area (Vmax): 0.83 cm^2. Valve area   (Vmean): 0.8 cm^2. - Mitral valve: There was mild regurgitation. - Tricuspid valve: There was mild-moderate regurgitation.   ASSESSMENT AND PLAN:  1.  Complete heart block: Status post Medtronic dual-chamber pacemaker.  Device function appropriately.  No changes at this time.  2.  Hypertension: Currently  well controlled.  No changes.  3.  Aortic valve replacement: Currently feeling well without issue.  Plan per primary cardiology.   Current medicines are reviewed at length with the patient today.   The patient does not have concerns regarding his medicines.  The following changes were made today: none  Labs/ tests ordered today include:  No orders of the defined types were placed in this encounter.    Disposition:   FU with Asli Tokarski 12 months  Signed, Coco Sharpnack Meredith Leeds, MD  06/03/2022 9:07 AM     [facility] [address] [place] [address] [phone] (office) [phone] (fax)

## 2022-06-03 NOTE — Patient Instructions (Addendum)
Medication Instructions:  Your physician recommends that you continue on your current medications as directed. Please refer to the Current Medication list given to you today.  *If you need a refill on your cardiac medications before your next appointment, please call your pharmacy*   Lab Work: None ordered   Testing/Procedures: None ordered   Follow-Up: At [facility], you and your health needs are our priority.  As part of our continuing mission to provide you with exceptional heart care, we have created designated Provider Care Teams.  These Care Teams include your primary Cardiologist (physician) and Advanced Practice Providers (APPs -  Physician Assistants and Nurse Practitioners) who all work together to provide you with the care you need, when you need it.  Remote monitoring is used to monitor your Pacemaker or ICD from home. This monitoring reduces the number of office visits required to check your device to one time per year. It allows [place] to keep an eye on the functioning of your device to ensure it is working properly. You are scheduled for a device check from home on 06/17/22. You may send your transmission at any time that day. If you have a wireless device, the transmission will be sent automatically. After your physician reviews your transmission, you will receive a postcard with your next transmission date.  Your next appointment:   1 Year  The format for your next appointment:   In Person  Provider:   Allegra Lai, MD    Thank you for choosing [facility]!!   Trinidad Curet, RN [phone]    Other Instructions   Important Information About Sugar

## 2022-06-05 DIAGNOSIS — Z9181 History of falling: Secondary | ICD-10-CM | POA: Diagnosis not present

## 2022-06-05 DIAGNOSIS — Z1331 Encounter for screening for depression: Secondary | ICD-10-CM | POA: Diagnosis not present

## 2022-06-05 DIAGNOSIS — Z0000 Encounter for general adult medical examination without abnormal findings: Secondary | ICD-10-CM | POA: Diagnosis not present

## 2022-06-05 DIAGNOSIS — Z139 Encounter for screening, unspecified: Secondary | ICD-10-CM | POA: Diagnosis not present

## 2022-06-05 DIAGNOSIS — E785 Hyperlipidemia, unspecified: Secondary | ICD-10-CM | POA: Diagnosis not present

## 2022-06-11 DIAGNOSIS — E875 Hyperkalemia: Secondary | ICD-10-CM | POA: Diagnosis not present

## 2022-06-24 DIAGNOSIS — R270 Ataxia, unspecified: Secondary | ICD-10-CM | POA: Diagnosis not present

## 2022-06-24 DIAGNOSIS — R4189 Other symptoms and signs involving cognitive functions and awareness: Secondary | ICD-10-CM | POA: Diagnosis not present

## 2022-06-24 DIAGNOSIS — G4700 Insomnia, unspecified: Secondary | ICD-10-CM | POA: Diagnosis not present

## 2022-06-24 DIAGNOSIS — G319 Degenerative disease of nervous system, unspecified: Secondary | ICD-10-CM | POA: Diagnosis not present

## 2022-06-27 ENCOUNTER — Ambulatory Visit: Payer: PPO

## 2022-06-27 DIAGNOSIS — M109 Gout, unspecified: Secondary | ICD-10-CM | POA: Diagnosis not present

## 2022-07-01 LAB — CUP PACEART REMOTE DEVICE CHECK
Battery Remaining Longevity: 78 mo
Battery Voltage: 2.98 V
Brady Statistic AP VP Percent: 0.85 %
Brady Statistic AP VS Percent: 0 %
Brady Statistic AS VP Percent: 99.05 %
Brady Statistic AS VS Percent: 0.1 %
Brady Statistic RA Percent Paced: 0.85 %
Brady Statistic RV Percent Paced: 99.9 %
Date Time Interrogation Session: 20230728111740
Implantable Lead Implant Date: 20080903
Implantable Lead Implant Date: 20080903
Implantable Lead Location: 753859
Implantable Lead Location: 753860
Implantable Lead Model: 5076
Implantable Lead Model: 5594
Implantable Pulse Generator Implant Date: 20190416
Lead Channel Impedance Value: 323 Ohm
Lead Channel Impedance Value: 342 Ohm
Lead Channel Impedance Value: 380 Ohm
Lead Channel Impedance Value: 380 Ohm
Lead Channel Pacing Threshold Amplitude: 0.625 V
Lead Channel Pacing Threshold Amplitude: 1.25 V
Lead Channel Pacing Threshold Pulse Width: 0.4 ms
Lead Channel Pacing Threshold Pulse Width: 0.4 ms
Lead Channel Sensing Intrinsic Amplitude: 2.875 mV
Lead Channel Sensing Intrinsic Amplitude: 2.875 mV
Lead Channel Sensing Intrinsic Amplitude: 5.625 mV
Lead Channel Sensing Intrinsic Amplitude: 5.625 mV
Lead Channel Setting Pacing Amplitude: 1.5 V
Lead Channel Setting Pacing Amplitude: 2.5 V
Lead Channel Setting Pacing Pulse Width: 0.4 ms
Lead Channel Setting Sensing Sensitivity: 4 mV

## 2022-07-05 DIAGNOSIS — G3184 Mild cognitive impairment, so stated: Secondary | ICD-10-CM | POA: Diagnosis not present

## 2022-07-05 DIAGNOSIS — R270 Ataxia, unspecified: Secondary | ICD-10-CM | POA: Diagnosis not present

## 2022-07-05 DIAGNOSIS — G3281 Cerebellar ataxia in diseases classified elsewhere: Secondary | ICD-10-CM | POA: Diagnosis not present

## 2022-07-08 DIAGNOSIS — R4189 Other symptoms and signs involving cognitive functions and awareness: Secondary | ICD-10-CM | POA: Diagnosis not present

## 2022-07-08 DIAGNOSIS — R41844 Frontal lobe and executive function deficit: Secondary | ICD-10-CM | POA: Diagnosis not present

## 2022-07-08 DIAGNOSIS — F419 Anxiety disorder, unspecified: Secondary | ICD-10-CM | POA: Diagnosis not present

## 2022-07-08 DIAGNOSIS — G119 Hereditary ataxia, unspecified: Secondary | ICD-10-CM | POA: Diagnosis not present

## 2022-07-19 DIAGNOSIS — K029 Dental caries, unspecified: Secondary | ICD-10-CM | POA: Diagnosis not present

## 2022-07-19 DIAGNOSIS — Z7982 Long term (current) use of aspirin: Secondary | ICD-10-CM | POA: Diagnosis not present

## 2022-07-19 DIAGNOSIS — G63 Polyneuropathy in diseases classified elsewhere: Secondary | ICD-10-CM | POA: Diagnosis not present

## 2022-07-19 DIAGNOSIS — J309 Allergic rhinitis, unspecified: Secondary | ICD-10-CM | POA: Diagnosis not present

## 2022-07-19 DIAGNOSIS — E538 Deficiency of other specified B group vitamins: Secondary | ICD-10-CM | POA: Diagnosis not present

## 2022-07-19 DIAGNOSIS — K219 Gastro-esophageal reflux disease without esophagitis: Secondary | ICD-10-CM | POA: Diagnosis not present

## 2022-07-19 DIAGNOSIS — G4700 Insomnia, unspecified: Secondary | ICD-10-CM | POA: Diagnosis not present

## 2022-07-19 DIAGNOSIS — E663 Overweight: Secondary | ICD-10-CM | POA: Diagnosis not present

## 2022-07-19 DIAGNOSIS — I10 Essential (primary) hypertension: Secondary | ICD-10-CM | POA: Diagnosis not present

## 2022-07-19 DIAGNOSIS — F419 Anxiety disorder, unspecified: Secondary | ICD-10-CM | POA: Diagnosis not present

## 2022-07-22 DIAGNOSIS — R4189 Other symptoms and signs involving cognitive functions and awareness: Secondary | ICD-10-CM | POA: Diagnosis not present

## 2022-07-22 DIAGNOSIS — R41844 Frontal lobe and executive function deficit: Secondary | ICD-10-CM | POA: Diagnosis not present

## 2022-08-19 DIAGNOSIS — G319 Degenerative disease of nervous system, unspecified: Secondary | ICD-10-CM | POA: Diagnosis not present

## 2022-08-19 DIAGNOSIS — E8849 Other mitochondrial metabolism disorders: Secondary | ICD-10-CM | POA: Diagnosis not present

## 2022-08-19 DIAGNOSIS — I10 Essential (primary) hypertension: Secondary | ICD-10-CM | POA: Diagnosis not present

## 2022-08-19 DIAGNOSIS — Z952 Presence of prosthetic heart valve: Secondary | ICD-10-CM | POA: Diagnosis not present

## 2022-08-19 DIAGNOSIS — Z950 Presence of cardiac pacemaker: Secondary | ICD-10-CM | POA: Diagnosis not present

## 2022-08-19 DIAGNOSIS — R7989 Other specified abnormal findings of blood chemistry: Secondary | ICD-10-CM | POA: Diagnosis not present

## 2022-08-19 DIAGNOSIS — R7303 Prediabetes: Secondary | ICD-10-CM | POA: Diagnosis not present

## 2022-08-27 DIAGNOSIS — R269 Unspecified abnormalities of gait and mobility: Secondary | ICD-10-CM | POA: Diagnosis not present

## 2022-08-27 DIAGNOSIS — D519 Vitamin B12 deficiency anemia, unspecified: Secondary | ICD-10-CM | POA: Diagnosis not present

## 2022-08-27 DIAGNOSIS — I10 Essential (primary) hypertension: Secondary | ICD-10-CM | POA: Diagnosis not present

## 2022-08-27 DIAGNOSIS — Z1211 Encounter for screening for malignant neoplasm of colon: Secondary | ICD-10-CM | POA: Diagnosis not present

## 2022-08-27 DIAGNOSIS — R413 Other amnesia: Secondary | ICD-10-CM | POA: Diagnosis not present

## 2022-08-27 DIAGNOSIS — K219 Gastro-esophageal reflux disease without esophagitis: Secondary | ICD-10-CM | POA: Diagnosis not present

## 2022-08-27 DIAGNOSIS — R2689 Other abnormalities of gait and mobility: Secondary | ICD-10-CM | POA: Diagnosis not present

## 2022-08-27 DIAGNOSIS — E785 Hyperlipidemia, unspecified: Secondary | ICD-10-CM | POA: Diagnosis not present

## 2022-08-27 DIAGNOSIS — D3A8 Other benign neuroendocrine tumors: Secondary | ICD-10-CM | POA: Diagnosis not present

## 2022-08-27 DIAGNOSIS — R7301 Impaired fasting glucose: Secondary | ICD-10-CM | POA: Diagnosis not present

## 2022-09-23 ENCOUNTER — Telehealth: Payer: Self-pay

## 2022-09-23 NOTE — Patient Outreach (Signed)
  Care Coordination   Initial Visit Note   09/23/2022 Name: Nathaniel Richard MRN: [id number] DOB: 1950/12/26  Nathaniel Richard is a 71 y.o. year old male who sees Nicoletta Dress, MD for primary care. I spoke with  Prince Solian by phone today.  What matters to the patients health and wellness today?  Placed call to patient to review, explain and offer [facility] care coordination program.  Patient declines needs   SDOH assessments and interventions completed:  No     Care Coordination Interventions Activated:  No  Care Coordination Interventions:  No, not indicated   Follow up plan: No further intervention required.   Encounter Outcome:  Pt. Refused   Tomasa Rand, RN, BSN, CEN [facility] [employer] [phone]

## 2022-09-26 ENCOUNTER — Ambulatory Visit (INDEPENDENT_AMBULATORY_CARE_PROVIDER_SITE_OTHER): Payer: PPO

## 2022-09-26 DIAGNOSIS — I442 Atrioventricular block, complete: Secondary | ICD-10-CM

## 2022-09-27 LAB — CUP PACEART REMOTE DEVICE CHECK
Battery Remaining Longevity: 75 mo
Battery Voltage: 2.97 V
Brady Statistic AP VP Percent: 2.3 %
Brady Statistic AP VS Percent: 0 %
Brady Statistic AS VP Percent: 97.57 %
Brady Statistic AS VS Percent: 0.12 %
Brady Statistic RA Percent Paced: 2.31 %
Brady Statistic RV Percent Paced: 99.88 %
Date Time Interrogation Session: 20231025191841
Implantable Lead Connection Status: 753985
Implantable Lead Connection Status: 753985
Implantable Lead Implant Date: 20080903
Implantable Lead Implant Date: 20080903
Implantable Lead Location: 753859
Implantable Lead Location: 753860
Implantable Lead Model: 5076
Implantable Lead Model: 5594
Implantable Pulse Generator Implant Date: 20190416
Lead Channel Impedance Value: 323 Ohm
Lead Channel Impedance Value: 342 Ohm
Lead Channel Impedance Value: 380 Ohm
Lead Channel Impedance Value: 399 Ohm
Lead Channel Pacing Threshold Amplitude: 0.625 V
Lead Channel Pacing Threshold Amplitude: 1.25 V
Lead Channel Pacing Threshold Pulse Width: 0.4 ms
Lead Channel Pacing Threshold Pulse Width: 0.4 ms
Lead Channel Sensing Intrinsic Amplitude: 2.875 mV
Lead Channel Sensing Intrinsic Amplitude: 2.875 mV
Lead Channel Sensing Intrinsic Amplitude: 9.75 mV
Lead Channel Sensing Intrinsic Amplitude: 9.75 mV
Lead Channel Setting Pacing Amplitude: 1.5 V
Lead Channel Setting Pacing Amplitude: 2.5 V
Lead Channel Setting Pacing Pulse Width: 0.4 ms
Lead Channel Setting Sensing Sensitivity: 4 mV
Zone Setting Status: 755011
Zone Setting Status: 755011

## 2022-09-27 NOTE — Telephone Encounter (Signed)
Erroneous encounter

## 2022-10-03 DIAGNOSIS — D3A8 Other benign neuroendocrine tumors: Secondary | ICD-10-CM | POA: Diagnosis not present

## 2022-10-03 DIAGNOSIS — K219 Gastro-esophageal reflux disease without esophagitis: Secondary | ICD-10-CM | POA: Diagnosis not present

## 2022-10-03 NOTE — Progress Notes (Signed)
Remote pacemaker transmission.   

## 2022-10-09 DIAGNOSIS — S76211A Strain of adductor muscle, fascia and tendon of right thigh, initial encounter: Secondary | ICD-10-CM | POA: Diagnosis not present

## 2022-10-14 DIAGNOSIS — Z1589 Genetic susceptibility to other disease: Secondary | ICD-10-CM | POA: Diagnosis not present

## 2022-10-14 DIAGNOSIS — E8849 Other mitochondrial metabolism disorders: Secondary | ICD-10-CM | POA: Diagnosis not present

## 2022-10-14 DIAGNOSIS — R7989 Other specified abnormal findings of blood chemistry: Secondary | ICD-10-CM | POA: Diagnosis not present

## 2022-10-14 DIAGNOSIS — G319 Degenerative disease of nervous system, unspecified: Secondary | ICD-10-CM | POA: Diagnosis not present

## 2022-11-06 ENCOUNTER — Encounter: Payer: Self-pay | Admitting: Cardiology

## 2022-11-08 DIAGNOSIS — K635 Polyp of colon: Secondary | ICD-10-CM | POA: Diagnosis not present

## 2022-11-08 DIAGNOSIS — Z1211 Encounter for screening for malignant neoplasm of colon: Secondary | ICD-10-CM | POA: Diagnosis not present

## 2022-11-08 DIAGNOSIS — Z950 Presence of cardiac pacemaker: Secondary | ICD-10-CM | POA: Diagnosis not present

## 2022-11-08 DIAGNOSIS — D126 Benign neoplasm of colon, unspecified: Secondary | ICD-10-CM | POA: Diagnosis not present

## 2022-11-08 DIAGNOSIS — K5730 Diverticulosis of large intestine without perforation or abscess without bleeding: Secondary | ICD-10-CM | POA: Diagnosis not present

## 2022-11-08 DIAGNOSIS — I10 Essential (primary) hypertension: Secondary | ICD-10-CM | POA: Diagnosis not present

## 2022-11-08 DIAGNOSIS — D124 Benign neoplasm of descending colon: Secondary | ICD-10-CM | POA: Diagnosis not present

## 2022-11-08 DIAGNOSIS — K644 Residual hemorrhoidal skin tags: Secondary | ICD-10-CM | POA: Diagnosis not present

## 2022-11-29 DIAGNOSIS — I10 Essential (primary) hypertension: Secondary | ICD-10-CM | POA: Diagnosis not present

## 2022-11-29 DIAGNOSIS — R269 Unspecified abnormalities of gait and mobility: Secondary | ICD-10-CM | POA: Diagnosis not present

## 2022-11-29 DIAGNOSIS — R7301 Impaired fasting glucose: Secondary | ICD-10-CM | POA: Diagnosis not present

## 2022-11-29 DIAGNOSIS — D519 Vitamin B12 deficiency anemia, unspecified: Secondary | ICD-10-CM | POA: Diagnosis not present

## 2022-11-29 DIAGNOSIS — K219 Gastro-esophageal reflux disease without esophagitis: Secondary | ICD-10-CM | POA: Diagnosis not present

## 2022-11-29 DIAGNOSIS — E785 Hyperlipidemia, unspecified: Secondary | ICD-10-CM | POA: Diagnosis not present

## 2022-12-09 DIAGNOSIS — G4700 Insomnia, unspecified: Secondary | ICD-10-CM | POA: Diagnosis not present

## 2022-12-09 DIAGNOSIS — R270 Ataxia, unspecified: Secondary | ICD-10-CM | POA: Diagnosis not present

## 2022-12-09 DIAGNOSIS — E8849 Other mitochondrial metabolism disorders: Secondary | ICD-10-CM | POA: Diagnosis not present

## 2022-12-09 DIAGNOSIS — Z888 Allergy status to other drugs, medicaments and biological substances status: Secondary | ICD-10-CM | POA: Diagnosis not present

## 2022-12-09 DIAGNOSIS — G319 Degenerative disease of nervous system, unspecified: Secondary | ICD-10-CM | POA: Diagnosis not present

## 2022-12-09 DIAGNOSIS — E560 Deficiency of vitamin E: Secondary | ICD-10-CM | POA: Diagnosis not present

## 2022-12-10 DIAGNOSIS — Z961 Presence of intraocular lens: Secondary | ICD-10-CM | POA: Diagnosis not present

## 2022-12-10 DIAGNOSIS — E8840 Mitochondrial metabolism disorder, unspecified: Secondary | ICD-10-CM | POA: Diagnosis not present

## 2022-12-10 DIAGNOSIS — K4090 Unilateral inguinal hernia, without obstruction or gangrene, not specified as recurrent: Secondary | ICD-10-CM | POA: Diagnosis not present

## 2022-12-10 DIAGNOSIS — K8020 Calculus of gallbladder without cholecystitis without obstruction: Secondary | ICD-10-CM | POA: Diagnosis not present

## 2022-12-10 DIAGNOSIS — Z09 Encounter for follow-up examination after completed treatment for conditions other than malignant neoplasm: Secondary | ICD-10-CM | POA: Diagnosis not present

## 2022-12-10 DIAGNOSIS — H02402 Unspecified ptosis of left eyelid: Secondary | ICD-10-CM | POA: Diagnosis not present

## 2022-12-10 DIAGNOSIS — Z79899 Other long term (current) drug therapy: Secondary | ICD-10-CM | POA: Diagnosis not present

## 2022-12-10 DIAGNOSIS — H18603 Keratoconus, unspecified, bilateral: Secondary | ICD-10-CM | POA: Diagnosis not present

## 2022-12-10 DIAGNOSIS — D3A8 Other benign neuroendocrine tumors: Secondary | ICD-10-CM | POA: Diagnosis not present

## 2022-12-10 DIAGNOSIS — Z86018 Personal history of other benign neoplasm: Secondary | ICD-10-CM | POA: Diagnosis not present

## 2022-12-23 NOTE — Progress Notes (Signed)
HPI: FU AVR. Also with prior PM. Had aortic valve replacement in December 2014 at [facility]. CTA December 2017 showed 4.2 cm thoracic aortic aneurysm unchanged from previous. There was a 13 mm abnormality in the pancreas and MRI recommended. Now followed by oncology.  Carotid Dopplers February 2018 showed no significant stenosis.  Follow-up CT of the liver in January 2021.  The thoracic aorta was measured at 4.3 cm unchanged compared to previous.  Echocardiogram March 2023 showed normal LV function, moderate asymmetric septal hypertrophy, mild left atrial enlargement, well-seated prosthetic aortic valve and dilated ascending aorta at 43 mm.  CTA March 2023 showed 42 mm ascending thoracic aortic aneurysm.  Since last seen, the patient denies any dyspnea on exertion, orthopnea, PND, pedal edema, palpitations, syncope or chest pain.   Current Outpatient Medications  Medication Sig Dispense Refill   amLODipine (NORVASC) 5 MG tablet Take 5 mg by mouth daily.     amoxicillin (AMOXIL) 500 MG tablet Take 2,000 mg by mouth as directed. Before dental appointments     aspirin 81 MG tablet Take 81 mg by mouth daily.     cetirizine (ZYRTEC) 10 MG tablet Take 10 mg by mouth daily as needed for allergies or rhinitis.      cyanocobalamin (,VITAMIN B-12,) 1000 MCG/ML injection Inject 1 mL into the muscle every 30 (thirty) days.     omeprazole (PRILOSEC) 20 MG capsule Take 20 mg by mouth daily as needed.     No current facility-administered medications for this visit.     Past Medical History:  Diagnosis Date   Arthritis    Hypertension    Pacemaker     Past Surgical History:  Procedure Laterality Date   AORTIC VALVE REPLACEMENT     EUS N/A 12/19/2016   Procedure: UPPER ENDOSCOPIC ULTRASOUND (EUS) RADIAL;  Surgeon: Milus Banister, MD;  Location: [facility];  Service: Endoscopy;  Laterality: N/A;   EUS N/A 12/19/2016   Procedure: UPPER ENDOSCOPIC ULTRASOUND (EUS) LINEAR;  Surgeon: Milus Banister, MD;  Location: [facility];  Service: Endoscopy;  Laterality: N/A;   EYE SURGERY     bilat cataracts   HEMORRHOID SURGERY     INSERT / REPLACE / [place] N/A 02/06/2017   Procedure: PPM Generator Changeout;  Surgeon: Will Meredith Leeds, MD;  Location: [place] CV [facility];  Service: Cardiovascular;  Laterality: N/A;   PPM GENERATOR CHANGEOUT N/A 03/17/2018   Procedure: PPM GENERATOR CHANGEOUT;  Surgeon: Constance Haw, MD;  Location: [place] CV LAB;  Service: Cardiovascular;  Laterality: N/A;   TOTAL HIP ARTHROPLASTY  07/29/2012   Procedure: TOTAL HIP ARTHROPLASTY;  Surgeon: Gearlean Alf, MD;  Location: [facility];  Service: Orthopedics;  Laterality: Right;    Social History   Socioeconomic History   Marital status: Married    Spouse name: Not on file   Number of children: 2   Years of education: Not on file   Highest education level: Not on file  Occupational History   Not on file  Tobacco Use   Smoking status: Never   Smokeless tobacco: Never  Vaping Use   Vaping Use: Never used  Substance and Sexual Activity   Alcohol use: No   Drug use: No   Sexual activity: Not on file  Other Topics Concern   Not on file  Social History Narrative   Married, wife Jeani Hawking   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   Goes to [employer] daily and enjoys golf   Social Determinants of Radio broadcast assistant Strain: Not on file  Food Insecurity: Not on file  Transportation Needs: Not on file  Physical Activity: Not on file  Stress: Not on file  Social Connections: Not on file  Intimate Partner Violence: Not on file    Family History  Problem Relation Age of Onset   Diabetes Mother    Hypertension Mother    Dementia Mother    Heart failure Father    Hypertension Father     ROS: no fevers or chills, productive cough, hemoptysis, dysphasia, odynophagia, melena, hematochezia, dysuria, hematuria, rash, seizure activity,  orthopnea, PND, pedal edema, claudication. Remaining systems are negative.  Physical Exam: Well-developed well-nourished in no acute distress.  Skin is warm and dry.  HEENT is normal.  Neck is supple.  Chest is clear to auscultation with normal expansion.  Cardiovascular exam is regular rate and rhythm.  2/6 systolic murmur left sternal border.  No diastolic murmur noted. Abdominal exam nontender or distended. No masses palpated. Extremities show no edema. neuro grossly intact  ECG-sinus rhythm with ventricular pacing.  Personally reviewed  A/P  1 status post aortic valve replacement-continue SBE prophylaxis.  2 thoracic aortic aneurysm-plan follow-up CTA March 2024.  3 hypertension-patient's blood pressure is controlled.  Continue present medications.  4 pacemaker-Per electrophysiology.  5 cerebellar ataxia-recently diagnosed.  Follow-up neurology.  Kirk Ruths, MD

## 2022-12-26 ENCOUNTER — Encounter: Payer: Self-pay | Admitting: Cardiology

## 2022-12-26 ENCOUNTER — Ambulatory Visit (INDEPENDENT_AMBULATORY_CARE_PROVIDER_SITE_OTHER): Payer: Medicare Other

## 2022-12-26 DIAGNOSIS — I442 Atrioventricular block, complete: Secondary | ICD-10-CM | POA: Diagnosis not present

## 2022-12-26 LAB — CUP PACEART REMOTE DEVICE CHECK
Battery Remaining Longevity: 73 mo
Battery Voltage: 2.97 V
Brady Statistic AP VP Percent: 3.31 %
Brady Statistic AP VS Percent: 0 %
Brady Statistic AS VP Percent: 96.3 %
Brady Statistic AS VS Percent: 0.39 %
Brady Statistic RA Percent Paced: 3.4 %
Brady Statistic RV Percent Paced: 99.6 %
Date Time Interrogation Session: 20240125013133
Implantable Lead Connection Status: 753985
Implantable Lead Connection Status: 753985
Implantable Lead Implant Date: 20080903
Implantable Lead Implant Date: 20080903
Implantable Lead Location: 753859
Implantable Lead Location: 753860
Implantable Lead Model: 5076
Implantable Lead Model: 5594
Implantable Pulse Generator Implant Date: 20190416
Lead Channel Impedance Value: 342 Ohm
Lead Channel Impedance Value: 361 Ohm
Lead Channel Impedance Value: 399 Ohm
Lead Channel Impedance Value: 418 Ohm
Lead Channel Pacing Threshold Amplitude: 0.625 V
Lead Channel Pacing Threshold Amplitude: 1.125 V
Lead Channel Pacing Threshold Pulse Width: 0.4 ms
Lead Channel Pacing Threshold Pulse Width: 0.4 ms
Lead Channel Sensing Intrinsic Amplitude: 16.625 mV
Lead Channel Sensing Intrinsic Amplitude: 16.625 mV
Lead Channel Sensing Intrinsic Amplitude: 3.25 mV
Lead Channel Sensing Intrinsic Amplitude: 3.25 mV
Lead Channel Setting Pacing Amplitude: 1.5 V
Lead Channel Setting Pacing Amplitude: 2.5 V
Lead Channel Setting Pacing Pulse Width: 0.4 ms
Lead Channel Setting Sensing Sensitivity: 4 mV
Zone Setting Status: 755011
Zone Setting Status: 755011

## 2023-01-06 ENCOUNTER — Encounter: Payer: Self-pay | Admitting: Cardiology

## 2023-01-06 ENCOUNTER — Ambulatory Visit: Payer: Medicare Other | Attending: Cardiology | Admitting: Cardiology

## 2023-01-06 VITALS — BP 138/86 | HR 69 | Ht 69.0 in | Wt 191.4 lb

## 2023-01-06 DIAGNOSIS — Z950 Presence of cardiac pacemaker: Secondary | ICD-10-CM

## 2023-01-06 DIAGNOSIS — I10 Essential (primary) hypertension: Secondary | ICD-10-CM | POA: Diagnosis not present

## 2023-01-06 DIAGNOSIS — I712 Thoracic aortic aneurysm, without rupture, unspecified: Secondary | ICD-10-CM | POA: Diagnosis not present

## 2023-01-06 DIAGNOSIS — Z952 Presence of prosthetic heart valve: Secondary | ICD-10-CM

## 2023-01-06 NOTE — Patient Instructions (Signed)
    Follow-Up: At [facility], you and your health needs are our priority.  As part of our continuing mission to provide you with exceptional heart care, we have created designated Provider Care Teams.  These Care Teams include your primary Cardiologist (physician) and Advanced Practice Providers (APPs -  Physician Assistants and Nurse Practitioners) who all work together to provide you with the care you need, when you need it.  We recommend signing up for the patient portal called "MyChart".  Sign up information is provided on this After Visit Summary.  MyChart is used to connect with patients for Virtual Visits (Telemedicine).  Patients are able to view lab/test results, encounter notes, upcoming appointments, etc.  Non-urgent messages can be sent to your provider as well.   To learn more about what you can do with MyChart, go to [email or web address].    Your next appointment:   12 month(s)  Provider:   Brian Crenshaw, MD     

## 2023-01-15 NOTE — Progress Notes (Signed)
Remote pacemaker transmission.   

## 2023-01-18 ENCOUNTER — Encounter: Payer: Self-pay | Admitting: Cardiology

## 2023-02-14 ENCOUNTER — Telehealth: Payer: Self-pay

## 2023-02-14 NOTE — Patient Outreach (Signed)
  Care Coordination   Initial Visit Note   02/14/2023 Name: Nathaniel Richard MRN: [id number] DOB: 17-Jan-1951  Nathaniel Richard is a 72 y.o. year old male who sees Nicoletta Dress, MD for primary care. I spoke with  Prince Solian by phone today.  What matters to the patients health and wellness today?  Placed call to patient to review and offer [facility] care coordination program. Patient reports that he is doing well and denies any needs at this time.      SDOH assessments and interventions completed:  No     Care Coordination Interventions:  No, not indicated   Follow up plan: No further intervention required.   Encounter Outcome:  Pt. Refused   Tomasa Rand, RN, BSN, CEN [facility] [employer] [phone]

## 2023-03-04 DIAGNOSIS — E785 Hyperlipidemia, unspecified: Secondary | ICD-10-CM | POA: Diagnosis not present

## 2023-03-04 DIAGNOSIS — D519 Vitamin B12 deficiency anemia, unspecified: Secondary | ICD-10-CM | POA: Diagnosis not present

## 2023-03-04 DIAGNOSIS — Z125 Encounter for screening for malignant neoplasm of prostate: Secondary | ICD-10-CM | POA: Diagnosis not present

## 2023-03-04 DIAGNOSIS — I10 Essential (primary) hypertension: Secondary | ICD-10-CM | POA: Diagnosis not present

## 2023-03-04 DIAGNOSIS — R7301 Impaired fasting glucose: Secondary | ICD-10-CM | POA: Diagnosis not present

## 2023-03-04 DIAGNOSIS — K219 Gastro-esophageal reflux disease without esophagitis: Secondary | ICD-10-CM | POA: Diagnosis not present

## 2023-03-27 ENCOUNTER — Ambulatory Visit (INDEPENDENT_AMBULATORY_CARE_PROVIDER_SITE_OTHER): Payer: Medicare Other

## 2023-03-27 DIAGNOSIS — I442 Atrioventricular block, complete: Secondary | ICD-10-CM

## 2023-03-27 LAB — CUP PACEART REMOTE DEVICE CHECK
Battery Remaining Longevity: 66 mo
Battery Voltage: 2.97 V
Brady Statistic AP VP Percent: 4.2 %
Brady Statistic AP VS Percent: 0 %
Brady Statistic AS VP Percent: 95.13 %
Brady Statistic AS VS Percent: 0.66 %
Brady Statistic RA Percent Paced: 4.4 %
Brady Statistic RV Percent Paced: 99.33 %
Date Time Interrogation Session: 20240425023151
Implantable Lead Connection Status: 753985
Implantable Lead Connection Status: 753985
Implantable Lead Implant Date: 20080903
Implantable Lead Implant Date: 20080903
Implantable Lead Location: 753859
Implantable Lead Location: 753860
Implantable Lead Model: 5076
Implantable Lead Model: 5594
Implantable Pulse Generator Implant Date: 20190416
Lead Channel Impedance Value: 323 Ohm
Lead Channel Impedance Value: 342 Ohm
Lead Channel Impedance Value: 399 Ohm
Lead Channel Impedance Value: 399 Ohm
Lead Channel Pacing Threshold Amplitude: 0.625 V
Lead Channel Pacing Threshold Amplitude: 1.125 V
Lead Channel Pacing Threshold Pulse Width: 0.4 ms
Lead Channel Pacing Threshold Pulse Width: 0.4 ms
Lead Channel Sensing Intrinsic Amplitude: 17.125 mV
Lead Channel Sensing Intrinsic Amplitude: 17.125 mV
Lead Channel Sensing Intrinsic Amplitude: 2.5 mV
Lead Channel Sensing Intrinsic Amplitude: 2.5 mV
Lead Channel Setting Pacing Amplitude: 1.5 V
Lead Channel Setting Pacing Amplitude: 2.5 V
Lead Channel Setting Pacing Pulse Width: 0.4 ms
Lead Channel Setting Sensing Sensitivity: 4 mV
Zone Setting Status: 755011
Zone Setting Status: 755011

## 2023-04-22 NOTE — Progress Notes (Signed)
Remote pacemaker transmission.   

## 2023-05-05 ENCOUNTER — Ambulatory Visit: Payer: Medicare Other | Attending: Cardiology | Admitting: Cardiology

## 2023-05-05 ENCOUNTER — Encounter: Payer: Self-pay | Admitting: Cardiology

## 2023-05-05 VITALS — BP 130/90 | HR 72 | Ht 69.0 in | Wt 187.0 lb

## 2023-05-05 DIAGNOSIS — I442 Atrioventricular block, complete: Secondary | ICD-10-CM | POA: Diagnosis not present

## 2023-05-05 DIAGNOSIS — Z952 Presence of prosthetic heart valve: Secondary | ICD-10-CM

## 2023-05-05 DIAGNOSIS — I10 Essential (primary) hypertension: Secondary | ICD-10-CM | POA: Diagnosis not present

## 2023-05-05 LAB — CUP PACEART INCLINIC DEVICE CHECK
Battery Remaining Longevity: 65 mo
Battery Voltage: 2.96 V
Brady Statistic AP VP Percent: 3.34 %
Brady Statistic AP VS Percent: 0 %
Brady Statistic AS VP Percent: 96.24 %
Brady Statistic AS VS Percent: 0.42 %
Brady Statistic RA Percent Paced: 3.46 %
Brady Statistic RV Percent Paced: 99.58 %
Date Time Interrogation Session: 20240603161901
Implantable Lead Connection Status: 753985
Implantable Lead Connection Status: 753985
Implantable Lead Implant Date: 20080903
Implantable Lead Implant Date: 20080903
Implantable Lead Location: 753859
Implantable Lead Location: 753860
Implantable Lead Model: 5076
Implantable Lead Model: 5594
Implantable Pulse Generator Implant Date: 20190416
Lead Channel Impedance Value: 380 Ohm
Lead Channel Impedance Value: 380 Ohm
Lead Channel Impedance Value: 437 Ohm
Lead Channel Impedance Value: 437 Ohm
Lead Channel Pacing Threshold Amplitude: 0.625 V
Lead Channel Pacing Threshold Amplitude: 1.125 V
Lead Channel Pacing Threshold Pulse Width: 0.4 ms
Lead Channel Pacing Threshold Pulse Width: 0.4 ms
Lead Channel Sensing Intrinsic Amplitude: 16.375 mV
Lead Channel Sensing Intrinsic Amplitude: 16.375 mV
Lead Channel Sensing Intrinsic Amplitude: 3 mV
Lead Channel Sensing Intrinsic Amplitude: 4.375 mV
Lead Channel Setting Pacing Amplitude: 1.5 V
Lead Channel Setting Pacing Amplitude: 2.5 V
Lead Channel Setting Pacing Pulse Width: 0.4 ms
Lead Channel Setting Sensing Sensitivity: 4 mV
Zone Setting Status: 755011
Zone Setting Status: 755011

## 2023-05-05 NOTE — Progress Notes (Signed)
Electrophysiology Office Note   Date:  05/05/2023   ID:  Nathaniel Richard 1951/06/12, MRN [id number]  PCP:  Paulina Fusi, MD  Cardiologist:  Jens Som Primary Electrophysiologist:  Vernel Donlan Jorja Loa, MD    No chief complaint on file.    History of Present Illness: Nathaniel Richard is a 72 y.o. male who presents today for electrophysiology evaluation.     He has a history of complete heart block post Medtronic pacemaker, aortic valve replacement December 2014 at [facility].  He was having fatigue prior to his pacemaker implant.  He had a generator change in 2018.  03/09/2017 he had surgery for pancreatic tumors.  He had a unipolar impedance alert that was thought due to his generator.  He had generator change 03/17/2018.  Today, denies symptoms of palpitations, chest pain, shortness of breath, orthopnea, PND, lower extremity edema, claudication, dizziness, presyncope, syncope, bleeding, or neurologic sequela. The patient is tolerating medications without difficulties.     Past Medical History:  Diagnosis Date   Arthritis    Hypertension    Pacemaker    Past Surgical History:  Procedure Laterality Date   AORTIC VALVE REPLACEMENT     EUS N/A 12/19/2016   Procedure: UPPER ENDOSCOPIC ULTRASOUND (EUS) RADIAL;  Surgeon: Rachael Fee, MD;  [facility];  Service: Endoscopy;  Laterality: N/A;   EUS N/A 12/19/2016   Procedure: UPPER ENDOSCOPIC ULTRASOUND (EUS) LINEAR;  Surgeon: Rachael Fee, MD;  [facility]: [facility];  Service: Endoscopy;  Laterality: N/A;   EYE SURGERY     bilat cataracts   HEMORRHOID SURGERY     INSERT / REPLACE / REMOVE PACEMAKER     PPM GENERATOR CHANGEOUT N/A 02/06/2017   Procedure: PPM Generator Changeout;  Surgeon: Ashyra Cantin Jorja Loa, MD;  [facility]: [facility];  Service: Cardiovascular;  Laterality: N/A;   PPM GENERATOR CHANGEOUT N/A 03/17/2018   Procedure: PPM GENERATOR CHANGEOUT;  Surgeon: Regan Lemming, MD;  Location: [facility]  INVASIVE CV LAB;  Service: Cardiovascular;  Laterality: N/A;   TOTAL HIP ARTHROPLASTY  07/29/2012   Procedure: TOTAL HIP ARTHROPLASTY;  Surgeon: Loanne Drilling, MD;  [facility];  Service: Orthopedics;  Laterality: Right;     Current Outpatient Medications  Medication Sig Dispense Refill   amLODipine (NORVASC) 5 MG tablet Take 5 mg by mouth daily.     amoxicillin (AMOXIL) 500 MG tablet Take 2,000 mg by mouth as directed. Before dental appointments     aspirin 81 MG tablet Take 81 mg by mouth daily.     B Complex-C-Folic Acid (SUPER B COMPLEX/FA/VIT C) TABS Take 1 capsule by mouth daily.     cetirizine (ZYRTEC) 10 MG tablet Take 10 mg by mouth daily as needed for allergies or rhinitis.      cyanocobalamin (,VITAMIN B-12,) 1000 MCG/ML injection Inject 1 mL into the muscle every 30 (thirty) days.     omeprazole (PRILOSEC) 20 MG capsule Take 20 mg by mouth daily as needed.     QUNOL COQ10/UBIQUINOL/MEGA PO Take 1 capsule by mouth daily.     vitamin E 180 MG (400 UNITS) capsule      No current facility-administered medications for this visit.    Allergies:   Propofol, Valproic acid and related, and Methocarbamol   Social History:  The patient  reports that he has never smoked. He has never used smokeless tobacco. He reports that he does not drink alcohol and does not use drugs.  Family History:  The patient's family history includes Dementia in his mother; Diabetes in his mother; Heart failure in his father; Hypertension in his father and mother.   ROS:  Please see the history of present illness.   Otherwise, review of systems is positive for none.   All other systems are reviewed and negative.   PHYSICAL EXAM: VS:  BP (!) 130/90   Pulse 72   Ht 5\' 9"  (1.753 m)   Wt 187 lb (84.8 kg)   SpO2 96%   BMI 27.62 kg/m  , BMI Body mass index is 27.62 kg/m. GEN: Well nourished, well developed, in no acute distress  HEENT: normal  Neck: no JVD, carotid bruits, or masses Cardiac: RRR;  no murmurs, rubs, or gallops,no edema  Respiratory:  clear to auscultation bilaterally, normal work of breathing GI: soft, nontender, nondistended, + BS MS: no deformity or atrophy  Skin: warm and dry, device site well healed Neuro:  Strength and sensation are intact Psych: euthymic mood, full affect  EKG:  EKG is not ordered today. Personal review of the ekg ordered 01/06/23 shows A sense, V pace  Personal review of the device interrogation today. Results in Paceart    Recent Labs: No results found for requested labs within last 365 days.    Lipid Panel  No results found for: "CHOL", "TRIG", "HDL", "CHOLHDL", "VLDL", "LDLCALC", "LDLDIRECT"   Wt Readings from Last 3 Encounters:  05/05/23 187 lb (84.8 kg)  01/06/23 191 lb 6.4 oz (86.8 kg)  06/03/22 178 lb 12.8 oz (81.1 kg)      Other studies Reviewed: Additional studies/ records that were reviewed today include: TTE 01/29/17 Review of the above records today demonstrates:  - Left ventricle: The cavity size was normal. Wall thickness was   normal. Systolic function was normal. The estimated ejection   fraction was in the range of 60% to 65%. Wall motion was normal;   there were no regional wall motion abnormalities. Features are   consistent with a pseudonormal left ventricular filling pattern,   with concomitant abnormal relaxation and increased filling   pressure (grade 2 diastolic dysfunction). - Aortic valve: Mildly calcified annulus. Moderately thickened,   moderately calcified leaflets. There was mild stenosis. Valve   area (VTI): 0.99 cm^2. Valve area (Vmax): 0.83 cm^2. Valve area   (Vmean): 0.8 cm^2. - Mitral valve: There was mild regurgitation. - Tricuspid valve: There was mild-moderate regurgitation.   ASSESSMENT AND PLAN:  1.  Complete heart block: Status post Medtronic dual-chamber pacemaker.  Device functioning appropriately.  No changes at this time.  2.  Hypertension: well controlled  3.  Aortic valve  replacement: Feeling well without issue.  Plan per primary cardiology.   Current medicines are reviewed at length with the patient today.   The patient does not have concerns regarding his medicines.  The following changes were made today: none  Labs/ tests ordered today include:  No orders of the defined types were placed in this encounter.    Disposition:   FU 12 months  Signed, Khira Cudmore Jorja Loa, MD  05/05/2023 3:58 PM     [facility] [address] [place] [address] [phone] (office) [phone] (fax)

## 2023-05-05 NOTE — Patient Instructions (Signed)
Medication Instructions:  Your physician recommends that you continue on your current medications as directed. Please refer to the Current Medication list given to you today. *If you need a refill on your cardiac medications before your next appointment, please call your pharmacy*   Follow-Up: At [facility], you and your health needs are our priority.  As part of our continuing mission to provide you with exceptional heart care, we have created designated Provider Care Teams.  These Care Teams include your primary Cardiologist (physician) and Advanced Practice Providers (APPs -  Physician Assistants and Nurse Practitioners) who all work together to provide you with the care you need, when you need it.    Your next appointment:   1 year(s)  Provider:   Will Camnitz, MD  

## 2023-06-03 DIAGNOSIS — R269 Unspecified abnormalities of gait and mobility: Secondary | ICD-10-CM | POA: Diagnosis not present

## 2023-06-03 DIAGNOSIS — I10 Essential (primary) hypertension: Secondary | ICD-10-CM | POA: Diagnosis not present

## 2023-06-03 DIAGNOSIS — D519 Vitamin B12 deficiency anemia, unspecified: Secondary | ICD-10-CM | POA: Diagnosis not present

## 2023-06-03 DIAGNOSIS — E785 Hyperlipidemia, unspecified: Secondary | ICD-10-CM | POA: Diagnosis not present

## 2023-06-03 DIAGNOSIS — R7301 Impaired fasting glucose: Secondary | ICD-10-CM | POA: Diagnosis not present

## 2023-06-03 DIAGNOSIS — K219 Gastro-esophageal reflux disease without esophagitis: Secondary | ICD-10-CM | POA: Diagnosis not present

## 2023-06-26 ENCOUNTER — Ambulatory Visit (INDEPENDENT_AMBULATORY_CARE_PROVIDER_SITE_OTHER): Payer: Medicare Other

## 2023-06-26 DIAGNOSIS — I442 Atrioventricular block, complete: Secondary | ICD-10-CM | POA: Diagnosis not present

## 2023-06-26 LAB — CUP PACEART REMOTE DEVICE CHECK
Battery Remaining Longevity: 61 mo
Battery Voltage: 2.96 V
Brady Statistic AP VP Percent: 3.46 %
Brady Statistic AP VS Percent: 0 %
Brady Statistic AS VP Percent: 96.01 %
Brady Statistic AS VS Percent: 0.53 %
Brady Statistic RA Percent Paced: 3.63 %
Brady Statistic RV Percent Paced: 99.47 %
Date Time Interrogation Session: 20240725021630
Implantable Lead Connection Status: 753985
Implantable Lead Connection Status: 753985
Implantable Lead Implant Date: 20080903
Implantable Lead Implant Date: 20080903
Implantable Lead Location: 753859
Implantable Lead Location: 753860
Implantable Lead Model: 5076
Implantable Lead Model: 5594
Implantable Pulse Generator Implant Date: 20190416
Lead Channel Impedance Value: 304 Ohm
Lead Channel Impedance Value: 323 Ohm
Lead Channel Impedance Value: 361 Ohm
Lead Channel Impedance Value: 380 Ohm
Lead Channel Pacing Threshold Amplitude: 0.5 V
Lead Channel Pacing Threshold Amplitude: 1 V
Lead Channel Pacing Threshold Pulse Width: 0.4 ms
Lead Channel Pacing Threshold Pulse Width: 0.4 ms
Lead Channel Sensing Intrinsic Amplitude: 2.375 mV
Lead Channel Sensing Intrinsic Amplitude: 2.375 mV
Lead Channel Sensing Intrinsic Amplitude: 9.125 mV
Lead Channel Sensing Intrinsic Amplitude: 9.125 mV
Lead Channel Setting Pacing Amplitude: 1.5 V
Lead Channel Setting Pacing Amplitude: 2.5 V
Lead Channel Setting Pacing Pulse Width: 0.4 ms
Lead Channel Setting Sensing Sensitivity: 4 mV
Zone Setting Status: 755011
Zone Setting Status: 755011

## 2023-07-08 NOTE — Progress Notes (Signed)
Remote pacemaker transmission.   

## 2023-08-14 ENCOUNTER — Encounter: Payer: Self-pay | Admitting: Cardiology

## 2023-09-16 DIAGNOSIS — E785 Hyperlipidemia, unspecified: Secondary | ICD-10-CM | POA: Diagnosis not present

## 2023-09-16 DIAGNOSIS — Z23 Encounter for immunization: Secondary | ICD-10-CM | POA: Diagnosis not present

## 2023-09-16 DIAGNOSIS — I10 Essential (primary) hypertension: Secondary | ICD-10-CM | POA: Diagnosis not present

## 2023-09-16 DIAGNOSIS — D519 Vitamin B12 deficiency anemia, unspecified: Secondary | ICD-10-CM | POA: Diagnosis not present

## 2023-09-16 DIAGNOSIS — R7301 Impaired fasting glucose: Secondary | ICD-10-CM | POA: Diagnosis not present

## 2023-09-16 DIAGNOSIS — K219 Gastro-esophageal reflux disease without esophagitis: Secondary | ICD-10-CM | POA: Diagnosis not present

## 2023-09-25 ENCOUNTER — Ambulatory Visit (INDEPENDENT_AMBULATORY_CARE_PROVIDER_SITE_OTHER): Payer: Medicare Other

## 2023-09-25 DIAGNOSIS — I442 Atrioventricular block, complete: Secondary | ICD-10-CM | POA: Diagnosis not present

## 2023-09-25 LAB — CUP PACEART REMOTE DEVICE CHECK
Battery Remaining Longevity: 54 mo
Battery Voltage: 2.96 V
Brady Statistic AP VP Percent: 3.49 %
Brady Statistic AP VS Percent: 0 %
Brady Statistic AS VP Percent: 96.01 %
Brady Statistic AS VS Percent: 0.5 %
Brady Statistic RA Percent Paced: 3.66 %
Brady Statistic RV Percent Paced: 99.5 %
Date Time Interrogation Session: 20241024023149
Implantable Lead Connection Status: 753985
Implantable Lead Connection Status: 753985
Implantable Lead Implant Date: 20080903
Implantable Lead Implant Date: 20080903
Implantable Lead Location: 753859
Implantable Lead Location: 753860
Implantable Lead Model: 5076
Implantable Lead Model: 5594
Implantable Pulse Generator Implant Date: 20190416
Lead Channel Impedance Value: 323 Ohm
Lead Channel Impedance Value: 361 Ohm
Lead Channel Impedance Value: 399 Ohm
Lead Channel Impedance Value: 399 Ohm
Lead Channel Pacing Threshold Amplitude: 0.5 V
Lead Channel Pacing Threshold Amplitude: 1 V
Lead Channel Pacing Threshold Pulse Width: 0.4 ms
Lead Channel Pacing Threshold Pulse Width: 0.4 ms
Lead Channel Sensing Intrinsic Amplitude: 16.625 mV
Lead Channel Sensing Intrinsic Amplitude: 16.625 mV
Lead Channel Sensing Intrinsic Amplitude: 2.25 mV
Lead Channel Sensing Intrinsic Amplitude: 2.25 mV
Lead Channel Setting Pacing Amplitude: 1.5 V
Lead Channel Setting Pacing Amplitude: 2.5 V
Lead Channel Setting Pacing Pulse Width: 0.4 ms
Lead Channel Setting Sensing Sensitivity: 4 mV
Zone Setting Status: 755011
Zone Setting Status: 755011

## 2023-10-14 NOTE — Progress Notes (Signed)
Remote pacemaker transmission.   

## 2023-12-09 DIAGNOSIS — Z08 Encounter for follow-up examination after completed treatment for malignant neoplasm: Secondary | ICD-10-CM | POA: Diagnosis not present

## 2023-12-09 DIAGNOSIS — Z90410 Acquired total absence of pancreas: Secondary | ICD-10-CM | POA: Diagnosis not present

## 2023-12-09 DIAGNOSIS — Z8507 Personal history of malignant neoplasm of pancreas: Secondary | ICD-10-CM | POA: Diagnosis not present

## 2023-12-09 DIAGNOSIS — H18603 Keratoconus, unspecified, bilateral: Secondary | ICD-10-CM | POA: Diagnosis not present

## 2023-12-09 DIAGNOSIS — H532 Diplopia: Secondary | ICD-10-CM | POA: Diagnosis not present

## 2023-12-09 DIAGNOSIS — D3A8 Other benign neuroendocrine tumors: Secondary | ICD-10-CM | POA: Diagnosis not present

## 2023-12-09 NOTE — Progress Notes (Signed)
 Surgical Oncology Follow-up Visit  12/09/2023  Care Team: Patient Care Team: Provider as PCP - General [name], MD as Pulmonologist (Pulmonary Disease) Epifanio Alm Sharper, MD (Cardiovascular Disease) Monetta Redell Agent, MD as Cardiologist (Cardiovascular Disease)   Diagnosis: No diagnosis found.   Procedure: Central Pancreatectomy 2018   Reason for visit/chief complaint: Follow-up for surveillance  HPI: Nathaniel Richard is a 73 y.o. male seen in follow-up for pNET. He initially presented for routine surveillance of a thoracic aortic aneurysm in December of '17. Imaging noted an incidental finding of a 1.3cm mass in the pancreatic neck. EUS noted a 1.3cm solid, round mass in the pancreatic neck. FNA demonstrated neuroendocrine tumor. 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 Final OR pathology noted well-diff, grade 1 NET, Ki-67 6%.   Since last evaluation, Nathaniel Richard reports he is doing well. Denies any weight loss, abdominal pain, flushing, diarrhea, palpitations. He notes minor decrease in energy elvel. 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 He states that any symptoms of energy decrease are likely related to this diagnosis.  There has been no interval change in their past surgical history.  Social History   Socioeconomic History  . Marital status: Married  . Number of children: 2  Tobacco Use  . Smoking status: Never  . Smokeless tobacco: Never  Vaping Use  . Vaping status: Never Used  Substance and Sexual Activity  . Alcohol use: No  . Drug use: No   Social Drivers of Health   Housing Stability: Unknown (12/06/2023)   Housing Stability Vital Sign   . Homeless in the Last Year: No     Medications: Personally reviewed Current Outpatient Medications  Medication Sig Dispense Refill  . amLODIPine  (NORVASC ) 5 MG tablet Take 5 mg by mouth once daily.  3  . amoxicillin (AMOXIL) 500 MG tablet  TAKE 4 TABLETS BY MOUTH ONE hour prior to dentail work  5  . aspirin  81 MG EC tablet Take 81 mg by mouth once daily.    . cetirizine (ZYRTEC) 10 mg capsule Take 10 mg by mouth once daily as needed for Allergies.     . coQ10, liposomal ubiquinol, 100 mg/5 mL Susp     . cyanocobalamin , vitamin B-12, (B-12 COMPLIANCE INJ) monthly    . omeprazole (PRILOSEC) 10 MG DR capsule Take 10 mg by mouth once daily    . vit B complex,vit C-folic acid 1,000 mcg Tab once daily    . vitamin E 200 UNIT capsule Take 200 Units by mouth once daily    . amantadine HCL (SYMMETREL) 100 mg capsule Take 100 mg by mouth 2 (two) times daily    . doxycycline (VIBRAMYCIN) 100 MG capsule Take 100 mg by mouth 2 (two) times daily  0  . mupirocin  (BACTROBAN ) 2 % ointment apply to q-tip and dab to sore in nostril TWICE DAILY  2  . ranitidine (ZANTAC) 150 MG tablet Take 150 mg by mouth nightly.       No current facility-administered medications for this visit.    ROS:  Complete ROS was done, all pertinent/positives/negatives listed in HPI, all other systems negative.  Physical exam: BP 136/76 (BP Location: Left upper arm, Patient Position: Sitting, BP Cuff Size: Large Adult)   Pulse 78   Temp 36.6 C (97.8 F)   Resp 18   Wt 87.2 kg (192 lb 3.9 oz)   SpO2 98%   BMI 29.04 kg/m  ECOG: Asymptomatic General  appearance: alert, appears stated age, and cooperative Lungs: normal work of breathing Abdomen: soft, non-tender; bowel sounds normal; no masses,  no organomegaly  Extremities: warm, well perfused. No notable edema.  Skin: Skin color, texture, turgor normal. No rashes or lesions Lymph nodes: Cervical, supraclavicular, and axillary nodes normal. Neurologic: Grossly normal Incisions: Well healed   Pathology: Personally reviewed Results     ** No results found for the last 1440 hours. **       Labs: personally reviewed  No results found for this visit on 12/09/23.   Imaging: Personally reviewed CT dual  pancreas protocol incl CT abd pel w contrast CT abdomen and pelvis with contrast  Comparison:  CT abdomen/pelvis dated 12/10/2022.  Indication:  pNET surveilance, D3A.8 Other benign neuroendocrine tumors (CMS-HCC).   Technique:  CT imaging of the abdomen and pelvis was performed with IV contrast. A pancreatic protocol CT was performed, with pancreatic arterial phase imaging of the abdomen and portal venous phase imaging of the abdomen and pelvis.  Iodinated contrast was used due to the indications for the examination, to improve disease detection and to further define anatomy. Coronal and sagittal reformatted images were generated and reviewed.  Findings: - Lower Thorax: No suspicious pulmonary abnormalities. No pleural or pericardial effusions.  - Liver: Normal in morphology and enhancement.  No suspicious hepatic masses are identified.  The portal and hepatic veins are patent.   - Biliary and Gallbladder: No intrahepatic or extrahepatic bile duct dilatation. Redemonstrated cholelithiasis.  - Spleen: Scattered punctate splenic calcifications, likely sequela of prior granulomatous disease.   - Pancreas: Status post enucleation of pancreatic neck mass without evidence of recurrent disease.   - Adrenal Glands: Normal in appearance.   - Kidneys: Symmetric in size and enhancement. No suspicious renal lesions. No nephrolithiasis. No hydronephrosis.  - Abdominal and Pelvic Vasculature: No abdominal aortic aneurysm. Mild atherosclerotic plaque.  - Gastrointestinal Tract: No abnormal dilation or wall thickening. Normal appendix.  - Peritoneum/Mesentery/Retroperitoneum: No free fluid.  No free intraperitoneal air.  - Lymph Nodes: No retroperitoneal, mesenteric, pelvic, or inguinal lymphadenopathy.    - Bladder: Normal in appearance.  - Pelvic Organs: Unremarkable.  - Body Wall: Unremarkable.  - Musculoskeletal:  No aggressive appearing osseous lesions. Right  hip arthroplasty.  Impression: No evidence of recurrent or metastatic disease in the abdomen or pelvis.  Electronically Reviewed by:  Lonni Marker, MD, [facility] Electronically Reviewed on:  12/09/2023 10:29 AM   Assessment/Plan No diagnosis found.   Nathaniel Richard returns for follow-up of pNET s/p robot central pancreatectomy/enucleation in April 201818. Labs look great today. Chrom A pending. Imaging without evidence of recurrent or metastatic disease. He remains asymptomatic. Plan to repeat labs and imaging in 1 yr for ongoing surveillance.   We appreciate the opportunity to participate in their care.   HAROLD FAIRY PRIMA, MD  Attestation Statement:   I personally saw and evaluated the patient, and participated in the management and treatment plan as documented in the resident/fellow note.  Sabino Zani Jr., MD  I spent a total of 30 minutes in both face-to-face and non-face-to-face activities, excluding procedures performed, for this visit on the date of this encounter.

## 2023-12-15 DIAGNOSIS — D519 Vitamin B12 deficiency anemia, unspecified: Secondary | ICD-10-CM | POA: Diagnosis not present

## 2023-12-15 DIAGNOSIS — K219 Gastro-esophageal reflux disease without esophagitis: Secondary | ICD-10-CM | POA: Diagnosis not present

## 2023-12-15 DIAGNOSIS — R7301 Impaired fasting glucose: Secondary | ICD-10-CM | POA: Diagnosis not present

## 2023-12-15 DIAGNOSIS — E785 Hyperlipidemia, unspecified: Secondary | ICD-10-CM | POA: Diagnosis not present

## 2023-12-15 DIAGNOSIS — I10 Essential (primary) hypertension: Secondary | ICD-10-CM | POA: Diagnosis not present

## 2023-12-16 DIAGNOSIS — R270 Ataxia, unspecified: Secondary | ICD-10-CM | POA: Diagnosis not present

## 2023-12-16 DIAGNOSIS — E8849 Other mitochondrial metabolism disorders: Secondary | ICD-10-CM | POA: Diagnosis not present

## 2023-12-16 DIAGNOSIS — H547 Unspecified visual loss: Secondary | ICD-10-CM | POA: Diagnosis not present

## 2023-12-17 DIAGNOSIS — S61209A Unspecified open wound of unspecified finger without damage to nail, initial encounter: Secondary | ICD-10-CM | POA: Diagnosis not present

## 2023-12-17 DIAGNOSIS — Z23 Encounter for immunization: Secondary | ICD-10-CM | POA: Diagnosis not present

## 2023-12-25 ENCOUNTER — Ambulatory Visit (INDEPENDENT_AMBULATORY_CARE_PROVIDER_SITE_OTHER): Payer: PPO

## 2023-12-25 DIAGNOSIS — I442 Atrioventricular block, complete: Secondary | ICD-10-CM

## 2023-12-25 DIAGNOSIS — L02511 Cutaneous abscess of right hand: Secondary | ICD-10-CM | POA: Diagnosis not present

## 2023-12-25 LAB — CUP PACEART REMOTE DEVICE CHECK
Battery Remaining Longevity: 50 mo
Battery Voltage: 2.95 V
Brady Statistic AP VP Percent: 3.68 %
Brady Statistic AP VS Percent: 0 %
Brady Statistic AS VP Percent: 95.91 %
Brady Statistic AS VS Percent: 0.4 %
Brady Statistic RA Percent Paced: 3.74 %
Brady Statistic RV Percent Paced: 99.59 %
Date Time Interrogation Session: 20250123013354
Implantable Lead Connection Status: 753985
Implantable Lead Connection Status: 753985
Implantable Lead Implant Date: 20080903
Implantable Lead Implant Date: 20080903
Implantable Lead Location: 753859
Implantable Lead Location: 753860
Implantable Lead Model: 5076
Implantable Lead Model: 5594
Implantable Pulse Generator Implant Date: 20190416
Lead Channel Impedance Value: 361 Ohm
Lead Channel Impedance Value: 361 Ohm
Lead Channel Impedance Value: 399 Ohm
Lead Channel Impedance Value: 418 Ohm
Lead Channel Pacing Threshold Amplitude: 0.625 V
Lead Channel Pacing Threshold Amplitude: 1 V
Lead Channel Pacing Threshold Pulse Width: 0.4 ms
Lead Channel Pacing Threshold Pulse Width: 0.4 ms
Lead Channel Sensing Intrinsic Amplitude: 15.625 mV
Lead Channel Sensing Intrinsic Amplitude: 15.625 mV
Lead Channel Sensing Intrinsic Amplitude: 3 mV
Lead Channel Sensing Intrinsic Amplitude: 3 mV
Lead Channel Setting Pacing Amplitude: 1.5 V
Lead Channel Setting Pacing Amplitude: 2.5 V
Lead Channel Setting Pacing Pulse Width: 0.4 ms
Lead Channel Setting Sensing Sensitivity: 4 mV
Zone Setting Status: 755011
Zone Setting Status: 755011

## 2024-01-02 NOTE — Progress Notes (Signed)
HPI: FU AVR. Also with prior PM. Had aortic valve replacement in December 2014 at [facility]. CTA December 2017 showed 4.2 cm thoracic aortic aneurysm unchanged from previous. There was a 13 mm abnormality in the pancreas and MRI recommended. Now followed by oncology.  Carotid Dopplers February 2018 showed no significant stenosis. Echocardiogram March 2023 showed normal LV function, moderate asymmetric septal hypertrophy, mild left atrial enlargement, well-seated prosthetic aortic valve and dilated ascending aorta at 43 mm.  CTA March 2023 showed 42 mm ascending thoracic aortic aneurysm.  Since last seen, there is no dyspnea, chest pain, palpitations or syncope.  Current Outpatient Medications  Medication Sig Dispense Refill   amantadine (SYMMETREL) 100 MG capsule Take 100 mg by mouth 2 (two) times daily.     amLODipine (NORVASC) 5 MG tablet Take 5 mg by mouth daily.     amoxicillin (AMOXIL) 500 MG tablet Take 2,000 mg by mouth as directed. Before dental appointments     aspirin 81 MG tablet Take 81 mg by mouth daily.     B Complex-C-Folic Acid (SUPER B COMPLEX/FA/VIT C) TABS Take 1 capsule by mouth daily.     cetirizine (ZYRTEC) 10 MG tablet Take 10 mg by mouth daily as needed for allergies or rhinitis.      cyanocobalamin (,VITAMIN B-12,) 1000 MCG/ML injection Inject 1 mL into the muscle every 30 (thirty) days.     gabapentin (NEURONTIN) 100 MG capsule Take by mouth. Take 1 capsule (100 mg total) by mouth at bedtime for 7 days, THEN 2 capsules (200 mg total) at bedtime for 7 days, THEN 3 capsules (300 mg total) at bedtime.     omeprazole (PRILOSEC) 20 MG capsule Take 20 mg by mouth daily as needed.     QUNOL COQ10/UBIQUINOL/MEGA PO Take 1 capsule by mouth 2 (two) times daily.     vitamin E 180 MG (400 UNITS) capsule      No current facility-administered medications for this visit.     Past Medical History:  Diagnosis Date   Arthritis    Hypertension    Pacemaker     Past  Surgical History:  Procedure Laterality Date   AORTIC VALVE REPLACEMENT     EUS N/A 12/19/2016   Procedure: UPPER ENDOSCOPIC ULTRASOUND (EUS) RADIAL;  Surgeon: Rachael Fee, MD;  Location: [facility];  Service: Endoscopy;  Laterality: N/A;   EUS N/A 12/19/2016   Procedure: UPPER ENDOSCOPIC ULTRASOUND (EUS) LINEAR;  Surgeon: Rachael Fee, MD;  Location: [facility];  Service: Endoscopy;  Laterality: N/A;   EYE SURGERY     bilat cataracts   HEMORRHOID SURGERY     INSERT / REPLACE / REMOVE PACEMAKER     PPM GENERATOR CHANGEOUT N/A 02/06/2017   Procedure: PPM Generator Changeout;  Surgeon: Will Jorja Loa, MD;  Location: [facility];  Service: Cardiovascular;  Laterality: N/A;   PPM GENERATOR CHANGEOUT N/A 03/17/2018   Procedure: PPM GENERATOR CHANGEOUT;  Surgeon: Regan Lemming, MD;  Location: [facility]AS[facility] CV LAB;  Service: Cardiovascular;  Laterality: N/A;   TOTAL HIP ARTHROPLASTY  07/29/2012   Procedure: TOTAL HIP ARTHROPLASTY;  Surgeon: Loanne Drilling, MD;  Location: [facility];  Service: Orthopedics;  Laterality: Right;    Social History   Socioeconomic History   Marital status: Married    Spouse name: Not on file   Number of children: 2   Years of education: Not on file   Highest education level: Not on file  Occupational History   Not on file  Tobacco Use   Smoking status: Never   Smokeless tobacco: Never  Vaping Use   Vaping status: Never Used  Substance and Sexual Activity   Alcohol use: No   Drug use: No   Sexual activity: Not on file  Other Topics Concern   Not on file  Social History Narrative   Married, wife Larita Fife   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   Goes to [employer] daily and enjoys golf   Social Drivers of Corporate investment banker Strain: Not on [employer] Insecurity: Not on file  Transportation Needs: Not on file  Physical Activity: Not on file  Stress: Not on file  Social Connections: Not on file  Intimate Partner  Violence: Not on file    Family History  Problem Relation Age of Onset   Diabetes Mother    Hypertension Mother    Dementia Mother    Heart failure Father    Hypertension Father     ROS: no fevers or chills, productive cough, hemoptysis, dysphasia, odynophagia, melena, hematochezia, dysuria, hematuria, rash, seizure activity, orthopnea, PND, pedal edema, claudication. Remaining systems are negative.  Physical Exam: Well-developed well-nourished in no acute distress.  Skin is warm and dry.  HEENT is normal.  Neck is supple.  Chest is clear to auscultation with normal expansion.  Cardiovascular exam is regular rate and rhythm.  Abdominal exam nontender or distended. No masses palpated. Extremities show no edema. neuro grossly intact  EKG Interpretation Date/Time:  Wednesday January 14 2024 11:17:40 EST Ventricular Rate:  76 PR Interval:  186 QRS Duration:  174 QT Interval:  448 QTC Calculation: 504 R Axis:   270  Text Interpretation: Atrial-sensed ventricular-paced rhythm No previous ECGs available Confirmed by Olga Millers ([id number]) on 01/14/2024 11:18:53 AM    A/P  1 status post AVR-continue SBE prophylaxis.  Most recent echocardiogram showed normally functioning valve.  2 thoracic aortic aneurysm-we will arrange follow-up CTA to better size.  3 hypertension-patient's blood pressure is controlled.  Continue present medications.  4 pacemaker-managed by electrophysiology.  5 cerebellar ataxia  Olga Millers, MD

## 2024-01-05 DIAGNOSIS — L02511 Cutaneous abscess of right hand: Secondary | ICD-10-CM | POA: Diagnosis not present

## 2024-01-07 DIAGNOSIS — S6010XA Contusion of unspecified finger with damage to nail, initial encounter: Secondary | ICD-10-CM | POA: Diagnosis not present

## 2024-01-07 NOTE — Progress Notes (Signed)
 SUBJECTIVE   Nathaniel Richard is a 73 y.o. (DOB 1951-08-11) male who presents today as a new patient at [facility].   The patient reports that one month ago, he caught is his gloved finger in a roll up utility door. He reports that there was severe swelling and redness. He reports that there was drainage from the area. He reports that there is much less redness and swelling now with the redness resolved since Monday of this week. He reports that he may have also caught part of the fourth digit of the right hand also.  Past Medical History:  Diagnosis Date  . Aortic stenosis due to bicuspid aortic valve    s/p replacement  . Complete heart block (CMD)   . Hypertension   . Neuroendocrine tumor    pancreas   Past Surgical History:  Procedure Laterality Date  . PACEMAKER LEAD REPLACEMENT     Procedure: PACEMAKER LEAD PLACEMENT; [employer]  . PANCREAS SURGERY     Procedure: PANCREAS SURGERY; for neuroendocrine tumor removal  . TOTAL HIP ARTHROPLASTY Right    Procedure: TOTAL HIP REPLACEMENT    Family History  Problem Relation Name Age of Onset  . Dementia Mother    . Hyperlipidemia Father    . Diabetes Father    . Hypertension Father    . Hypertension Sister    . Hypertension Brother    . Diabetes Brother       Current Outpatient Medications  Medication Sig Dispense Refill  . alpha tocopherol (VITAMIN E) 268 mg (400 unit) capsule     . amantadine (SYMMETREL) 100 mg capsule Take 1 capsule (100 mg total) by mouth 2 (two) times a day. 60 capsule 11  . amLODIPine  (NORVASC ) 5 mg tablet amlodipine  5 mg tablet    . aspirin  81 mg chewable tablet daily.    . cetirizine (ZyrTEC) 10 mg cap Take 10 mg by mouth daily as needed.    . clindamycin (CLEOCIN) 300 mg capsule TAKE ONE CAPSULE BY MOUTH 3 TIMES DAILY WITH FOOD    . coQ10, liposomal ubiquinol, (Active Q) 100 mg/5 mL susp Take by mouth 2 (two) times a day.    . cyanocobalamin  (VITAMIN B12)  1,000 mcg/mL injection Inject 1,000 mcg into the muscle every 30 (thirty) days.    SABRA gabapentin (NEURONTIN) 100 mg capsule Take 1 capsule (100 mg total) by mouth at bedtime for 7 days, THEN 2 capsules (200 mg total) at bedtime for 7 days, THEN 3 capsules (300 mg total) at bedtime. 291 capsule 1  . omeprazole (PriLOSEC) 10 mg DR capsule Take 10 mg by mouth as needed.    . vit B complex,vit C-folic acid (TM-Vite RX) 1,000 mcg tab Take 1 tablet by mouth daily.     No current facility-administered medications for this visit.     REVIEW OF SYSTEMS: Negative other than those pertinent positives noted in the HPI.  OBJECTIVE   Vitals:   01/07/24 0941  BP: 122/70  Pulse: 71  Resp: 14  Temp: 97.1 F (36.2 C)  SpO2: 99%       Constitutional Oriented to person, place and time. Appears well-developed and well nourished.  HEENT Normocephalic, atraumatic.  Neck Normal ROM. Supple. No adenopathy. No thyromegaly. No bruits.   Cardiovascular Normal rate and regular rhythm. Exam reveals no gallop and no friction rub. No murmur heard.  Pulmonary/Chest Effort normal and breath sounds normal. No wheezes. No rales  MSK Normal ROM  of all joints  Neurological Patient is alert and oriented to person, place and time. No cranial nerve deficit. DTRs normal.   Skin At the third digit of the right hand, there is a moderate sized subungal hematoma. The area is tender. There is no surrounding erythema, no tenderness at the tip of the finger or at the paronychia. The nail is slightly loose. There are no abscesses or other fluctuant masses aside of the subungal hematoma. At the fourth digit, there is a very small subungal hematoma. Skin is warm and dry. No rash noted. Patient is not diaphoretic  Abdomen Soft, not tender, not distended.  Bowel sounds are normal in pitch and activity.  No masses are palpable.   Psychiatric Normal mood and affect. Behavior normal. Judgment and thought content normal.    Procedure  note: Preoperative diagnosis: subungal hematoma of the third digit of the right hand.  Postoperative diagnosis: subungal hematoma of the third digit of the right hand.  Procedure: puncture aspiration of the subungal hematoma of the third digit of the right hand.  Specimen: none Complication: none.  EBL: none Procedure: With verbal and written consent. The third digit of the right hand was prepped and draped. A 19 gauge fill needle was advanced into the nail over the area of the subungal hematoma. There was scant drainage of dark, old blood. No purulent drainage. The patient tolerated the procedure well.  Devere Lewis, LPN, was present during this procedure.  ASSESSMENT   Encounter Diagnosis  Name Primary?  . Subungual hematoma of digit of hand, initial encounter Yes    PLAN  Puncture aspiration of subungal hematoma of the third digit of the right hand today in the office.   I recommended continued daily soaking.   I recommended continuing the course of oral antibiotics.   I instructed the patient to call if there are any changes in signs or symptoms.   I discussed with the patient and the patient's wife that the patient may still lose this nail despite drainage of the subungal hematoma. They voiced understanding.   All questions were answered.   Follow up in this office in one week.   Risks, benefits, and alternatives of the medication(s) and treatment plan(s) were discussed, and he expressed understanding. Plan follow up as discussed or as needed.   Electronically signed by: Prentice Regal Loraine, DO 01/07/2024 10:38 AM

## 2024-01-11 IMAGING — CT CT ANGIO CHEST
2 of 6 series · 17 of 46 positions shown · IV contrast (Omnipaque)
Comparison: 11/18/2016

CLINICAL DATA: 70-year-old male with a history of aortic aneurysm

EXAM:
CT ANGIOGRAPHY CHEST WITH CONTRAST
TECHNIQUE: Multidetector CT imaging of the chest was performed using the
standard protocol during bolus administration of intravenous
contrast. Multiplanar CT image reconstructions and MIPs were
obtained to evaluate the vascular anatomy.

[Series 4: axial arterial · axial · arterial · 0.70mm/px · z∈[-164,+109]mm · 14 of 103 slices shown]
[im 6/103  lung]
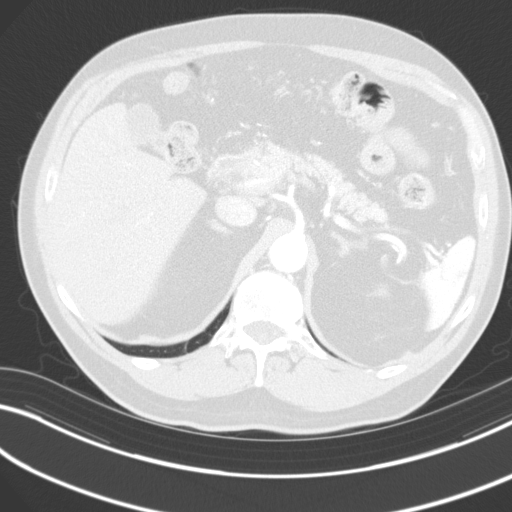
[im 11/103  soft-tissue]
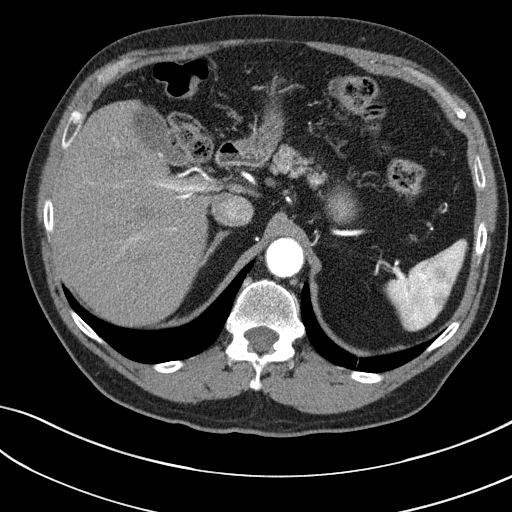
[im 22/103  lung]
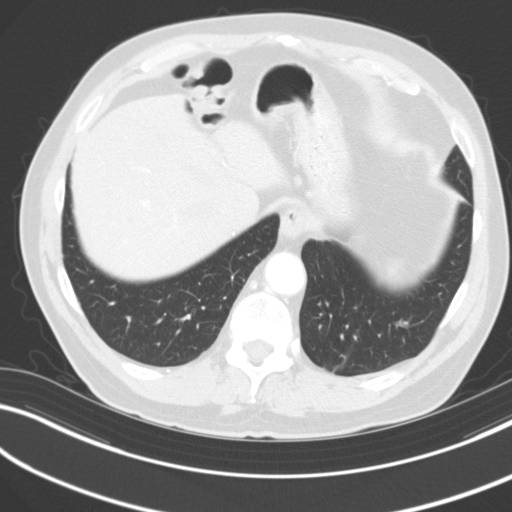
[im 27/103  soft-tissue]
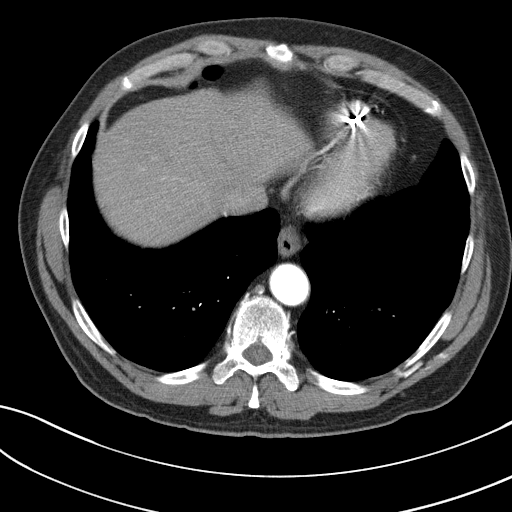
[im 33/103  lung]
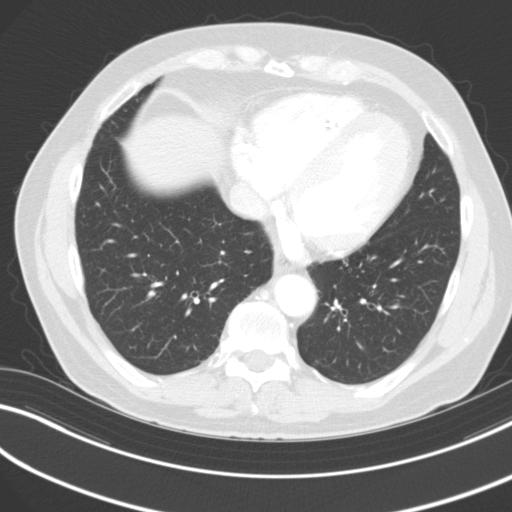
[im 43/103  soft-tissue]
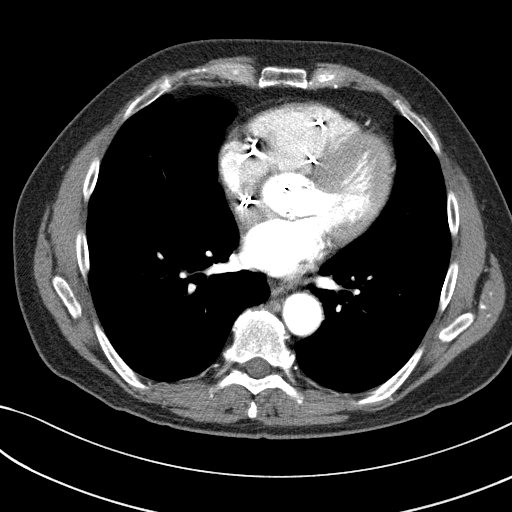
[im 49/103  lung]
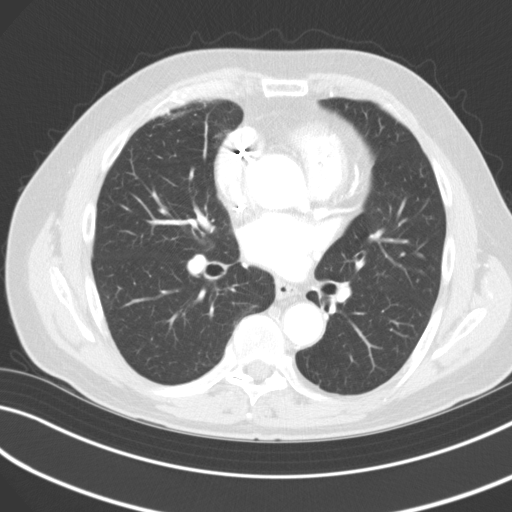
[im 54/103  soft-tissue]
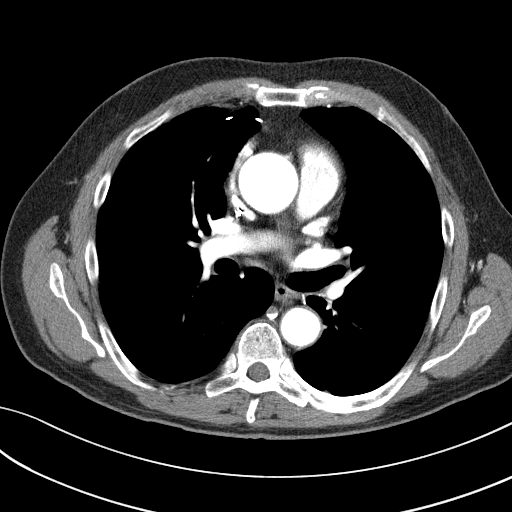
[im 60/103  lung]
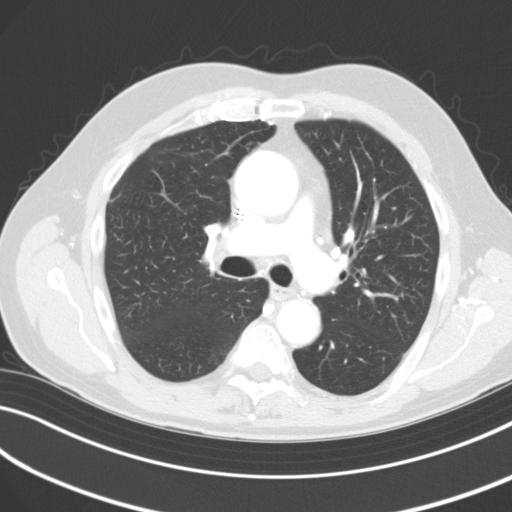
[im 70/103  soft-tissue]
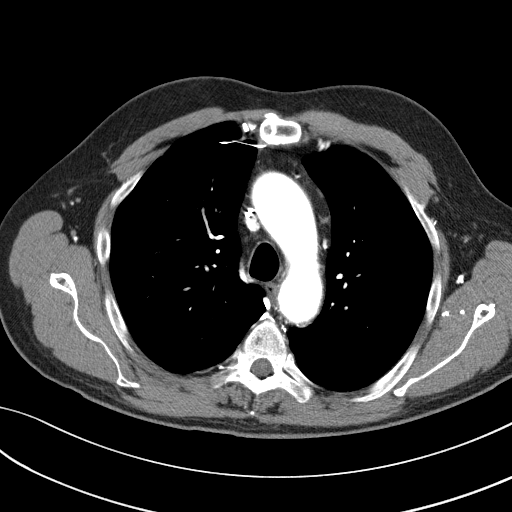
[im 76/103  lung]
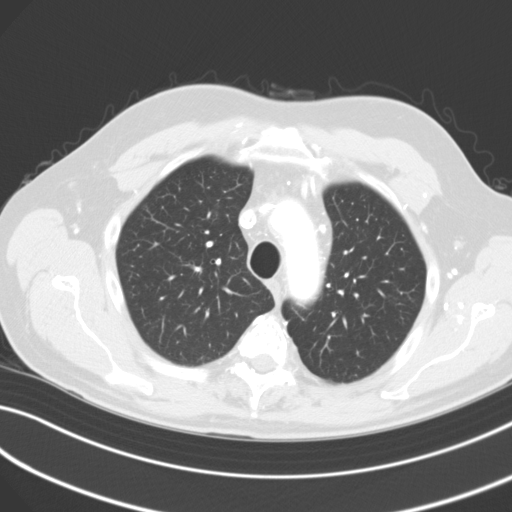
[im 81/103  soft-tissue]
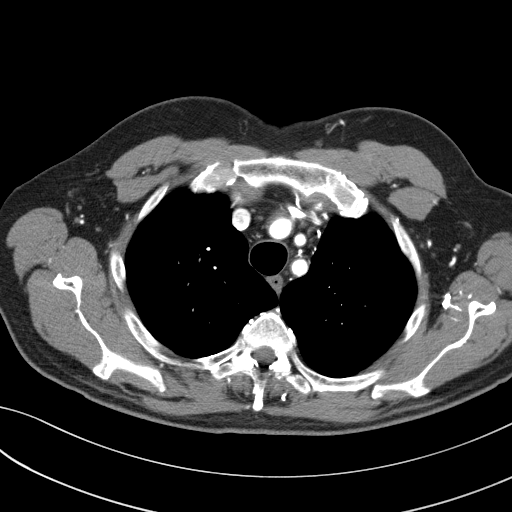
[im 92/103  lung]
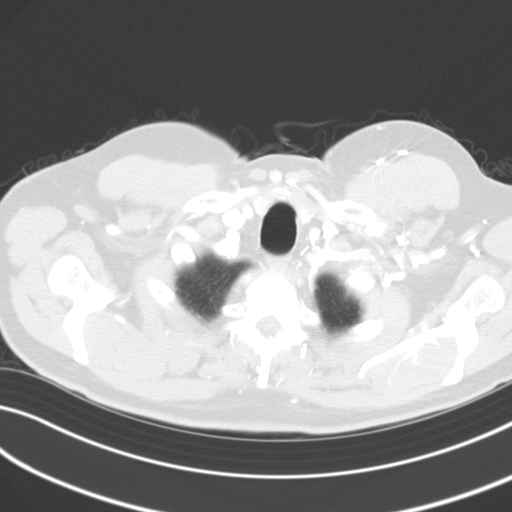
[im 97/103  soft-tissue]
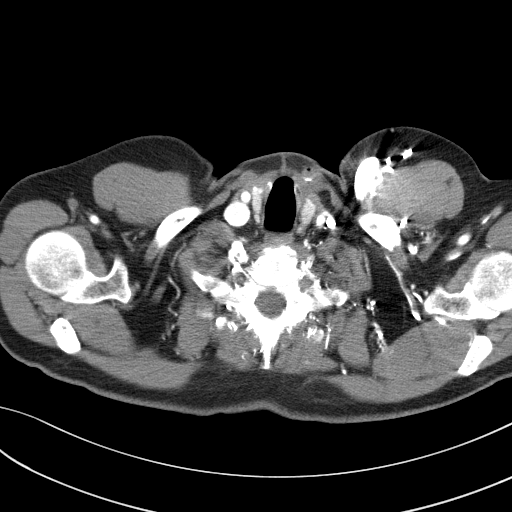

[Series 7: coronal · coronal · 0.63mm/px · 3 of 101 slices shown]
[im 26/101  soft-tissue]
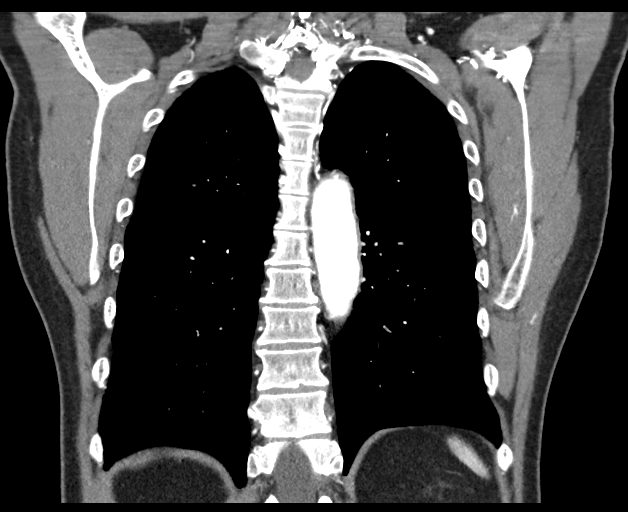
[im 51/101  soft-tissue]
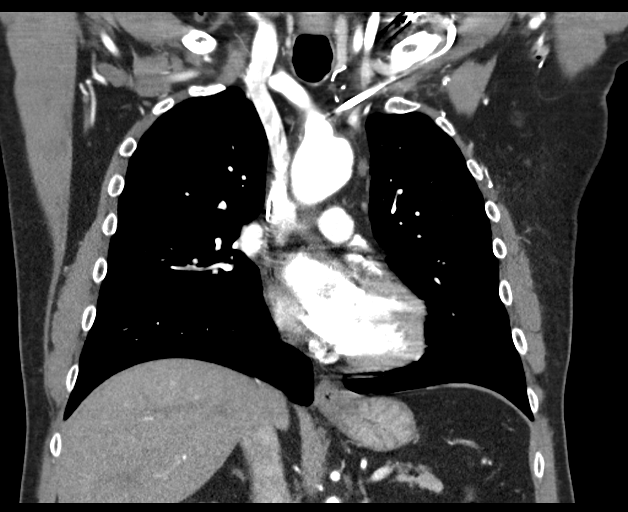
[im 76/101  soft-tissue]
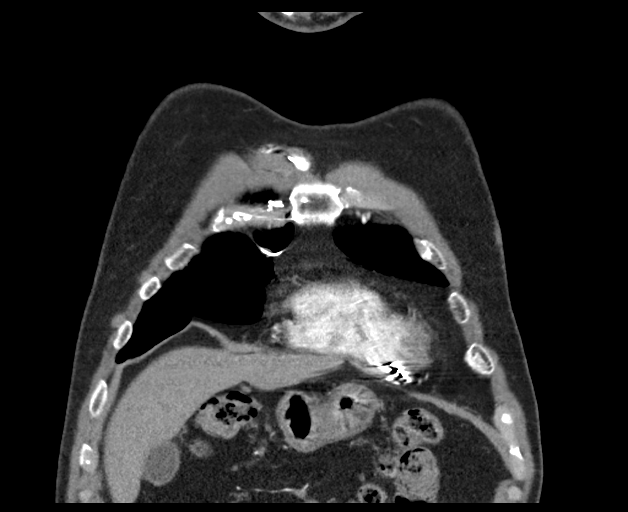

[17 of 46 positions shown; findings below may reference images not displayed]

RADIATION DOSE REDUCTION: This exam was performed according to the
departmental dose-optimization program which includes automated
exposure control, adjustment of the mA and/or kV according to
patient size and/or use of iterative reconstruction technique.

CONTRAST:  100mL OMNIPAQUE IOHEXOL 350 MG/ML SOLN
FINDINGS: Cardiovascular:

Heart:

Surgical changes of prior mini thoracotomy and aortic valve repair.
Pacing leads unchanged within the right ventricle, right atrium. No
significant coronary calcifications.

Aorta:

Surgical changes of prior aortic valve repair.

Greatest estimated diameter of the Tegineza junction, 33 mm on
the coronal images.

Greatest estimated diameter of the ascending aorta on the axial
images, 42 mm.

Minimal atherosclerotic changes of the aorta. Branch vessels are
patent with a 3 vessel arch. Cervical cerebral vessels patent at the
base of the neck.

No pedunculated plaque, ulcerated plaque, dissection, periaortic
fluid. No wall thickening.

Venous:

The contrast bolus enters via left upper extremity. Multiple
collateral draining veins of the left upper extremity and through
the paravertebral plexus. Pacing leads from left chest wall
subclavian approach. The left subclavian vein and brachiocephalic
vein appears completely atretic/collapsed around the pacing leads
with no significant contrast opacification.

Pulmonary arteries:

Timing of the contrast bolus is not optimized for evaluation of
pulmonary artery filling defects. Unremarkable size of the main
pulmonary artery.

Mediastinum/Nodes: No mediastinal adenopathy. Unremarkable
appearance of the thoracic esophagus.

Unremarkable appearance of the thoracic inlet.

Lungs/Pleura: Central airways are clear. No pleural effusion. No
confluent airspace disease.

No pneumothorax.

Upper Abdomen: No acute finding of the upper abdomen.

Musculoskeletal: No acute displaced fracture. Degenerative changes
of the spine.

Review of the MIP images confirms the above findings.
IMPRESSION: CT angiogram demonstrates no significant change in
size/configuration of ascending aorta, estimated 42 mm on the
current study. Aortic aneurysm NOS ([id number])

Redemonstration of surgical changes of mini thoracotomy and aortic
valve repair.

Occlusion of the left subclavian vein and brachiocephalic vein
secondary to the presence of the pacing leads.

## 2024-01-14 ENCOUNTER — Ambulatory Visit: Payer: Medicare Other | Attending: Cardiology | Admitting: Cardiology

## 2024-01-14 ENCOUNTER — Encounter: Payer: Self-pay | Admitting: Cardiology

## 2024-01-14 VITALS — BP 124/82 | HR 76 | Ht 69.0 in | Wt 185.8 lb

## 2024-01-14 DIAGNOSIS — Z09 Encounter for follow-up examination after completed treatment for conditions other than malignant neoplasm: Secondary | ICD-10-CM | POA: Diagnosis not present

## 2024-01-14 DIAGNOSIS — I442 Atrioventricular block, complete: Secondary | ICD-10-CM

## 2024-01-14 DIAGNOSIS — S6010XA Contusion of unspecified finger with damage to nail, initial encounter: Secondary | ICD-10-CM | POA: Diagnosis not present

## 2024-01-14 DIAGNOSIS — I712 Thoracic aortic aneurysm, without rupture, unspecified: Secondary | ICD-10-CM | POA: Diagnosis not present

## 2024-01-14 NOTE — Patient Instructions (Signed)
    Testing/Procedures:  CTA OF THE CHEST [facility] 1 ST FLOOR IMAGING DEPARTMENT   Follow-Up: At [facility], you and your health needs are our priority.  As part of our continuing mission to provide you with exceptional heart care, we have created designated Provider Care Teams.  These Care Teams include your primary Cardiologist (physician) and Advanced Practice Providers (APPs -  Physician Assistants and Nurse Practitioners) who all work together to provide you with the care you need, when you need it.   Your next appointment:   12 month(s)  Provider:   Olga Millers, MD

## 2024-01-16 ENCOUNTER — Ambulatory Visit (HOSPITAL_BASED_OUTPATIENT_CLINIC_OR_DEPARTMENT_OTHER)
Admission: RE | Admit: 2024-01-16 | Discharge: 2024-01-16 | Disposition: A | Discharge status: Home / Self Care | Payer: Medicare Other | Source: Ambulatory Visit | Attending: Cardiology | Admitting: Cardiology

## 2024-01-16 DIAGNOSIS — I700 Atherosclerosis of aorta: Secondary | ICD-10-CM | POA: Diagnosis not present

## 2024-01-16 DIAGNOSIS — I77810 Thoracic aortic ectasia: Secondary | ICD-10-CM | POA: Diagnosis not present

## 2024-01-16 DIAGNOSIS — I712 Thoracic aortic aneurysm, without rupture, unspecified: Secondary | ICD-10-CM | POA: Insufficient documentation

## 2024-01-16 DIAGNOSIS — K8020 Calculus of gallbladder without cholecystitis without obstruction: Secondary | ICD-10-CM | POA: Diagnosis not present

## 2024-01-16 MED ORDER — IOHEXOL 350 MG/ML SOLN
75.0000 mL | Freq: Once | INTRAVENOUS | Status: AC | PRN
Start: 2024-01-16 — End: 2024-01-16
  Administered 2024-01-16: 75 mL via INTRAVENOUS

## 2024-01-20 ENCOUNTER — Encounter: Payer: Self-pay | Admitting: *Deleted

## 2024-01-20 ENCOUNTER — Other Ambulatory Visit: Payer: Self-pay | Admitting: *Deleted

## 2024-01-20 DIAGNOSIS — I712 Thoracic aortic aneurysm, without rupture, unspecified: Secondary | ICD-10-CM

## 2024-02-02 DIAGNOSIS — E785 Hyperlipidemia, unspecified: Secondary | ICD-10-CM | POA: Diagnosis not present

## 2024-02-02 DIAGNOSIS — M109 Gout, unspecified: Secondary | ICD-10-CM | POA: Diagnosis not present

## 2024-02-05 NOTE — Progress Notes (Signed)
 Remote pacemaker transmission.

## 2024-02-18 ENCOUNTER — Encounter: Payer: Self-pay | Admitting: Cardiology

## 2024-03-01 DIAGNOSIS — R270 Ataxia, unspecified: Secondary | ICD-10-CM | POA: Diagnosis not present

## 2024-03-01 DIAGNOSIS — Z1589 Genetic susceptibility to other disease: Secondary | ICD-10-CM | POA: Diagnosis not present

## 2024-03-01 DIAGNOSIS — E8849 Other mitochondrial metabolism disorders: Secondary | ICD-10-CM | POA: Diagnosis not present

## 2024-03-12 DIAGNOSIS — H02402 Unspecified ptosis of left eyelid: Secondary | ICD-10-CM | POA: Diagnosis not present

## 2024-03-12 DIAGNOSIS — H532 Diplopia: Secondary | ICD-10-CM | POA: Diagnosis not present

## 2024-03-23 DIAGNOSIS — J301 Allergic rhinitis due to pollen: Secondary | ICD-10-CM | POA: Diagnosis not present

## 2024-03-23 DIAGNOSIS — Z125 Encounter for screening for malignant neoplasm of prostate: Secondary | ICD-10-CM | POA: Diagnosis not present

## 2024-03-23 DIAGNOSIS — D519 Vitamin B12 deficiency anemia, unspecified: Secondary | ICD-10-CM | POA: Diagnosis not present

## 2024-03-23 DIAGNOSIS — E785 Hyperlipidemia, unspecified: Secondary | ICD-10-CM | POA: Diagnosis not present

## 2024-03-23 DIAGNOSIS — R7301 Impaired fasting glucose: Secondary | ICD-10-CM | POA: Diagnosis not present

## 2024-03-23 DIAGNOSIS — I10 Essential (primary) hypertension: Secondary | ICD-10-CM | POA: Diagnosis not present

## 2024-03-25 ENCOUNTER — Ambulatory Visit (INDEPENDENT_AMBULATORY_CARE_PROVIDER_SITE_OTHER): Payer: PPO

## 2024-03-25 DIAGNOSIS — I442 Atrioventricular block, complete: Secondary | ICD-10-CM | POA: Diagnosis not present

## 2024-03-31 LAB — CUP PACEART REMOTE DEVICE CHECK
Battery Remaining Longevity: 45 mo
Battery Voltage: 2.95 V
Brady Statistic AP VP Percent: 1.86 %
Brady Statistic AP VS Percent: 0 %
Brady Statistic AS VP Percent: 98.09 %
Brady Statistic AS VS Percent: 0.05 %
Brady Statistic RA Percent Paced: 1.85 %
Brady Statistic RV Percent Paced: 99.95 %
Date Time Interrogation Session: 20250429162506
Implantable Lead Connection Status: 753985
Implantable Lead Connection Status: 753985
Implantable Lead Implant Date: 20080903
Implantable Lead Implant Date: 20080903
Implantable Lead Location: 753859
Implantable Lead Location: 753860
Implantable Lead Model: 5076
Implantable Lead Model: 5594
Implantable Pulse Generator Implant Date: 20190416
Lead Channel Impedance Value: 323 Ohm
Lead Channel Impedance Value: 342 Ohm
Lead Channel Impedance Value: 380 Ohm
Lead Channel Impedance Value: 399 Ohm
Lead Channel Pacing Threshold Amplitude: 0.5 V
Lead Channel Pacing Threshold Amplitude: 1 V
Lead Channel Pacing Threshold Pulse Width: 0.4 ms
Lead Channel Pacing Threshold Pulse Width: 0.4 ms
Lead Channel Sensing Intrinsic Amplitude: 18.75 mV
Lead Channel Sensing Intrinsic Amplitude: 18.75 mV
Lead Channel Sensing Intrinsic Amplitude: 2.75 mV
Lead Channel Sensing Intrinsic Amplitude: 2.75 mV
Lead Channel Setting Pacing Amplitude: 1.5 V
Lead Channel Setting Pacing Amplitude: 2.5 V
Lead Channel Setting Pacing Pulse Width: 0.4 ms
Lead Channel Setting Sensing Sensitivity: 4 mV
Zone Setting Status: 755011
Zone Setting Status: 755011

## 2024-05-05 NOTE — Addendum Note (Signed)
 Addended by: Lott Rouleau A on: 05/05/2024 12:39 PM   Modules accepted: Orders

## 2024-05-05 NOTE — Progress Notes (Signed)
 Remote pacemaker transmission.

## 2024-05-27 DIAGNOSIS — H02402 Unspecified ptosis of left eyelid: Secondary | ICD-10-CM | POA: Diagnosis not present

## 2024-05-27 DIAGNOSIS — H547 Unspecified visual loss: Secondary | ICD-10-CM | POA: Diagnosis not present

## 2024-05-27 DIAGNOSIS — H532 Diplopia: Secondary | ICD-10-CM | POA: Diagnosis not present

## 2024-05-27 DIAGNOSIS — H02409 Unspecified ptosis of unspecified eyelid: Secondary | ICD-10-CM | POA: Diagnosis not present

## 2024-05-27 DIAGNOSIS — Q999 Chromosomal abnormality, unspecified: Secondary | ICD-10-CM | POA: Diagnosis not present

## 2024-06-11 DIAGNOSIS — H18603 Keratoconus, unspecified, bilateral: Secondary | ICD-10-CM | POA: Diagnosis not present

## 2024-06-11 DIAGNOSIS — H532 Diplopia: Secondary | ICD-10-CM | POA: Diagnosis not present

## 2024-06-11 DIAGNOSIS — H3552 Pigmentary retinal dystrophy: Secondary | ICD-10-CM | POA: Diagnosis not present

## 2024-06-11 DIAGNOSIS — H5032 Intermittent alternating esotropia: Secondary | ICD-10-CM | POA: Diagnosis not present

## 2024-06-24 ENCOUNTER — Ambulatory Visit (INDEPENDENT_AMBULATORY_CARE_PROVIDER_SITE_OTHER): Payer: PPO

## 2024-06-24 DIAGNOSIS — I442 Atrioventricular block, complete: Secondary | ICD-10-CM

## 2024-06-24 LAB — CUP PACEART REMOTE DEVICE CHECK
Battery Remaining Longevity: 41 mo
Battery Voltage: 2.94 V
Brady Statistic AP VP Percent: 2.98 %
Brady Statistic AP VS Percent: 0 %
Brady Statistic AS VP Percent: 96.95 %
Brady Statistic AS VS Percent: 0.07 %
Brady Statistic RA Percent Paced: 2.97 %
Brady Statistic RV Percent Paced: 99.93 %
Date Time Interrogation Session: 20250724021806
Implantable Lead Connection Status: 753985
Implantable Lead Connection Status: 753985
Implantable Lead Implant Date: 20080903
Implantable Lead Implant Date: 20080903
Implantable Lead Location: 753859
Implantable Lead Location: 753860
Implantable Lead Model: 5076
Implantable Lead Model: 5594
Implantable Pulse Generator Implant Date: 20190416
Lead Channel Impedance Value: 304 Ohm
Lead Channel Impedance Value: 342 Ohm
Lead Channel Impedance Value: 361 Ohm
Lead Channel Impedance Value: 380 Ohm
Lead Channel Pacing Threshold Amplitude: 0.625 V
Lead Channel Pacing Threshold Amplitude: 1.125 V
Lead Channel Pacing Threshold Pulse Width: 0.4 ms
Lead Channel Pacing Threshold Pulse Width: 0.4 ms
Lead Channel Sensing Intrinsic Amplitude: 17.875 mV
Lead Channel Sensing Intrinsic Amplitude: 17.875 mV
Lead Channel Sensing Intrinsic Amplitude: 2.25 mV
Lead Channel Sensing Intrinsic Amplitude: 2.25 mV
Lead Channel Setting Pacing Amplitude: 1.5 V
Lead Channel Setting Pacing Amplitude: 2.5 V
Lead Channel Setting Pacing Pulse Width: 0.4 ms
Lead Channel Setting Sensing Sensitivity: 4 mV
Zone Setting Status: 755011
Zone Setting Status: 755011

## 2024-06-28 ENCOUNTER — Other Ambulatory Visit: Payer: Self-pay

## 2024-06-28 ENCOUNTER — Ambulatory Visit: Payer: Self-pay | Admitting: Cardiology

## 2024-07-12 ENCOUNTER — Ambulatory Visit: Admitting: Cardiology

## 2024-08-19 ENCOUNTER — Telehealth: Payer: Self-pay

## 2024-08-19 ENCOUNTER — Ambulatory Visit: Payer: Self-pay | Admitting: General Surgery

## 2024-08-19 NOTE — Telephone Encounter (Signed)
   Pre-operative Risk Assessment    Patient Name: Nathaniel Richard  DOB: 25-Mar-1951 MRN: [id number]   Date of last office visit: 01/14/24 CAMPBELL SHALLOW, MD Date of next office visit: 11/01/24 WILL CAMNITZ, MD   Request for Surgical Clearance    Procedure:  HERNIA SURGERY  Date of Surgery:  Clearance TBD                                Surgeon:  CORDELLA IDLER, MD Surgeon's Group or Practice Name:  [facility] Phone number:  [phone] Fax number:  [phone]   ATTN: ROSELINE ARGYLE, CMA   Type of Clearance Requested:   - Medical  - Pharmacy:  Hold Aspirin      Type of Anesthesia:  General    Additional requests/questions:    Signed, Lucie DELENA Ku   08/19/2024, 4:47 PM

## 2024-08-20 ENCOUNTER — Telehealth: Payer: Self-pay

## 2024-08-20 NOTE — Telephone Encounter (Signed)
 Appointment scheduled for 08/23/2024 @ 3pm. Med req and consent are complete.

## 2024-08-20 NOTE — Telephone Encounter (Signed)
  Patient Consent for Virtual Visit         Nathaniel Richard has provided verbal consent on 08/20/2024 for a virtual visit (video or telephone).  Appointment scheduled for 08/23/2024 @ 3pm. Med req and consent are complete.    CONSENT FOR VIRTUAL VISIT FOR:  Nathaniel Richard  By participating in this virtual visit I agree to the following:  I hereby voluntarily request, consent and authorize [facility] and its employed or contracted physicians, physician assistants, nurse practitioners or other licensed health care professionals (the Practitioner), to provide me with telemedicine health care services (the "Services) as deemed necessary by the treating Practitioner. I acknowledge and consent to receive the Services by the Practitioner via telemedicine. I understand that the telemedicine visit will involve communicating with the Practitioner through live audiovisual communication technology and the disclosure of certain medical information by electronic transmission. I acknowledge that I have been given the opportunity to request an in-person assessment or other available alternative prior to the telemedicine visit and am voluntarily participating in the telemedicine visit.  I understand that I have the right to withhold or withdraw my consent to the use of telemedicine in the course of my care at any time, without affecting my right to future care or treatment, and that the Practitioner or I may terminate the telemedicine visit at any time. I understand that I have the right to inspect all information obtained and/or recorded in the course of the telemedicine visit and may receive copies of available information for a reasonable fee.  I understand that some of the potential risks of receiving the Services via telemedicine include:  Delay or interruption in medical evaluation due to technological equipment failure or disruption; Information transmitted may not be sufficient (e.g. poor resolution  of images) to allow for appropriate medical decision making by the Practitioner; and/or  In rare instances, security protocols could fail, causing a breach of personal health information.  Furthermore, I acknowledge that it is my responsibility to provide information about my medical history, conditions and care that is complete and accurate to the best of my ability. I acknowledge that Practitioner's advice, recommendations, and/or decision may be based on factors not within their control, such as incomplete or inaccurate data provided by me or distortions of diagnostic images or specimens that may result from electronic transmissions. I understand that the practice of medicine is not an exact science and that Practitioner makes no warranties or guarantees regarding treatment outcomes. I acknowledge that a copy of this consent can be made available to me via my patient portal [facility] MyChart), or I can request a printed copy by calling the office of [facility].    I understand that my insurance will be billed for this visit.   I have read or had this consent read to me. I understand the contents of this consent, which adequately explains the benefits and risks of the Services being provided via telemedicine.  I have been provided ample opportunity to ask questions regarding this consent and the Services and have had my questions answered to my satisfaction. I give my informed consent for the services to be provided through the use of telemedicine in my medical care

## 2024-08-20 NOTE — Telephone Encounter (Signed)
   Name: Nathaniel Richard  DOB: 04/08/1951  MRN: [id number]  Primary Cardiologist: [name], MD  Chart reviewed as part of pre-operative protocol coverage. Because of Nathaniel Richard's past medical history and time since last visit, he will require a follow-up telephone visit in order to better assess preoperative cardiovascular risk.  Pre-op covering staff: - Please schedule appointment and call patient to inform them. If patient already had an upcoming appointment within acceptable timeframe, please add pre-op clearance to the appointment notes so provider is aware. - Please contact requesting surgeon's office via preferred method (i.e, phone, fax) to inform them of need for appointment prior to surgery.  As long as patient is asymptomatic at the time of phone call, can hold aspirin  x 5 to 7 days prior to procedure.  Please resume medic safe to do so.  Patient lives in [place] .  [name], PA-C  08/20/2024, 7:54 AM

## 2024-08-23 ENCOUNTER — Encounter: Payer: Self-pay | Admitting: Cardiology

## 2024-08-23 ENCOUNTER — Ambulatory Visit: Attending: Cardiovascular Disease | Admitting: Nurse Practitioner

## 2024-08-23 DIAGNOSIS — Z01810 Encounter for preprocedural cardiovascular examination: Secondary | ICD-10-CM | POA: Diagnosis not present

## 2024-08-23 NOTE — Telephone Encounter (Signed)
 Spoke w/ patient - he confirmed the appt on 9/29.

## 2024-08-23 NOTE — Telephone Encounter (Signed)
 Attempted to call patient - [email or web address]. MyChart message sent to patient to advise that 12/1 appointment was moved to 9/29 with WC in [place] + text sent to patient via 9/29 appt. Asked for response or call back to confirm/reschedule.   Text sent to patient:  Nathaniel Richard, this is Dr. Carolyn office. Your 12/1 appt is moved to 9/29 at 11:45am since you were due June '25 and with needing preop clearance for hernia surgery. This appointment is located at the [facility] office - [address] [place], [place] [address]. Confirm at [phone]. Thanks!

## 2024-08-23 NOTE — Telephone Encounter (Signed)
 Patient's last OV with EP provider Ritta) was 05/05/23  Patient overdue for 1 year follow up and must be seen prior to device clearance being given  I will message EP scheduling to get patient in with next available or APP before December if possible

## 2024-08-23 NOTE — Progress Notes (Signed)
 Virtual Visit via Telephone Note   Because of Nathaniel Richard co-morbid illnesses, he is at least at moderate risk for complications without adequate follow up.  This format is felt to be most appropriate for this patient at this time.  Due to technical limitations with video connection (technology), today's appointment will be conducted as an audio only telehealth visit, and Nathaniel Richard verbally agreed to proceed in this manner.   All issues noted in this document were discussed and addressed.  No physical exam could be performed with this format.  Evaluation Performed:  Preoperative cardiovascular risk assessment _____________   Date:  08/23/2024   Patient ID:  Nathaniel Richard, DOB 1951-02-28, MRN [id number] Patient Location:  Home Provider location:   Office  Primary Care Provider:  [name], MD Primary Cardiologist:  [name], MD  Chief Complaint / Patient Profile   73 y.o. y/o male with a h/o aortic valve replacement, thoracic aortic aneurysm, complete heart block s/p PPM, hypertension, and cerebellar ataxia, who is pending hernia surgery with Dr. Cordella Idler of [facility] and presents today for telephonic preoperative cardiovascular risk assessment.  History of Present Illness    Nathaniel Richard is a 73 y.o. male who presents via audio/video conferencing for a telehealth visit today.  Pt was last seen in cardiology clinic on 01/14/2024 by Dr. Shallow. At that time Nathaniel Richard was doing well. The patient is now pending procedure as outlined above. Since his last visit, he has done well from a cardiac standpoint.   He denies chest pain, palpitations, dyspnea, pnd, orthopnea, n, v, dizziness, syncope, edema, weight gain, or early satiety. All other systems reviewed and are otherwise negative except as noted above.   Past Medical History    Past Medical History:  Diagnosis Date   Arthritis    Hypertension    Pacemaker    Past Surgical History:   Procedure Laterality Date   AORTIC VALVE REPLACEMENT     EUS N/A 12/19/2016   Procedure: UPPER ENDOSCOPIC ULTRASOUND (EUS) RADIAL;  Surgeon: Toribio SHAUNNA Cedar, MD;  Location: [facility];  Service: Endoscopy;  Laterality: N/A;   EUS N/A 12/19/2016   Procedure: UPPER ENDOSCOPIC ULTRASOUND (EUS) LINEAR;  Surgeon: Toribio SHAUNNA Cedar, MD;  Location: [facility];  Service: Endoscopy;  Laterality: N/A;   EYE SURGERY     bilat cataracts   HEMORRHOID SURGERY     INSERT / REPLACE / REMOVE PACEMAKER     PPM GENERATOR CHANGEOUT N/A 02/06/2017   Procedure: PPM Generator Changeout;  Surgeon: Will Gladis Norton, MD;  Location: [facility];  Service: Cardiovascular;  Laterality: N/A;   PPM GENERATOR CHANGEOUT N/A 03/17/2018   Procedure: PPM GENERATOR CHANGEOUT;  Surgeon: Norton Soyla Gladis, MD;  Location: [facility];  Service: Cardiovascular;  Laterality: N/A;   TOTAL HIP ARTHROPLASTY  07/29/2012   Procedure: TOTAL HIP ARTHROPLASTY;  Surgeon: Dempsey LULLA Moan, MD;  Location: [facility];  Service: Orthopedics;  Laterality: Right;    Allergies  Allergies  Allergen Reactions   Propofol  Other (See Comments)    Other Reaction(s): Fatique  Risk of metabolic acidosis and propofol -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  Other Reaction(s): Fatigue  Risk of metabolic acidosis and propofol -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  Mental status changes,  Psychotic episodes, Mental  status changes, Psychotic episodes., Mental status changes,    Home Medications    Prior to Admission medications   Medication Sig Start Date End Date Taking? Authorizing Provider  amantadine (SYMMETREL) 100 MG capsule Take 100 mg by mouth 2 (two) times daily. 12/16/23 12/15/24  [provider]  amLODipine  (NORVASC ) 5 MG tablet Take 5 mg by mouth daily. 05/28/22   [provider]  amoxicillin (AMOXIL) 500 MG tablet Take 2,000 mg by mouth as directed. Before dental appointments    [provider]  aspirin  81 MG tablet Take 81 mg by mouth daily.    [provider]  B Complex-C-Folic Acid (SUPER B COMPLEX/FA/VIT C) TABS Take 1 capsule by mouth daily. 10/15/22   [provider]  cetirizine (ZYRTEC) 10 MG tablet Take 10 mg by mouth daily as needed for allergies or rhinitis.     [provider]  cyanocobalamin  (,VITAMIN B-12,) 1000 MCG/ML injection Inject 1 mL into the muscle every 30 (thirty) days.    [provider]  gabapentin (NEURONTIN) 100 MG capsule Take by mouth. Take 1 capsule (100 mg total) by mouth at bedtime for 7 days, THEN 2 capsules (200 mg total) at bedtime for 7 days, THEN 3 capsules (300 mg total) at bedtime. 12/23/23 04/05/24  [provider]  omeprazole (PRILOSEC) 20 MG capsule Take 20 mg by mouth daily as needed.    [provider]  WALKER COQ10/UBIQUINOL/MEGA PO Take 1 capsule by mouth 2 (two) times daily. 10/15/22   [provider]  vitamin E 180 MG (400 UNITS) capsule  10/15/22   [provider]    Physical Exam    Vital Signs:  PARTICK MUSSELMAN does not have vital signs available for review today.  Given telephonic nature of communication, physical exam is limited. AAOx3. NAD. Normal affect.  Speech and respirations are unlabored.  Accessory Clinical Findings    None  Assessment & Plan    1.  Preoperative Cardiovascular Risk Assessment:  According to the Revised Cardiac Risk  Index (RCRI), his Perioperative Risk of Major Cardiac Event is (%): 0.4. His Functional Capacity in METs is: 7.01 according to the Duke Activity Status Index (DASI).Therefore, based on ACC/AHA guidelines, patient would be at acceptable risk for the planned procedure without further cardiovascular testing.   The patient was advised that if he develops new symptoms prior to surgery to contact our office to arrange for a follow-up visit, and he verbalized understanding.   Regarding ASA therapy, we recommend continuation of ASA throughout the perioperative period. However, if the surgeon feels that cessation of ASA is required in the perioperative period, it may be stopped 5-7 days prior to surgery with a plan to resume it as soon as felt to be feasible from a surgical standpoint in the post-operative period.  A copy of this note will be routed to requesting surgeon.  I will forward original clearance request to our device clinic in case there are any additional recommendations needed prior to surgery.  Time:   Today, I have spent 6 minutes with the patient with telehealth technology discussing medical history, symptoms, and management plan.     Damien JAYSON Braver, NP  08/23/2024, 3:09  PM

## 2024-08-30 ENCOUNTER — Encounter: Payer: Self-pay | Admitting: Cardiology

## 2024-08-30 ENCOUNTER — Ambulatory Visit: Attending: Cardiology | Admitting: Cardiology

## 2024-08-30 VITALS — BP 118/68 | HR 76 | Ht 69.0 in | Wt 176.2 lb

## 2024-08-30 DIAGNOSIS — I10 Essential (primary) hypertension: Secondary | ICD-10-CM | POA: Diagnosis not present

## 2024-08-30 DIAGNOSIS — Z952 Presence of prosthetic heart valve: Secondary | ICD-10-CM

## 2024-08-30 DIAGNOSIS — I442 Atrioventricular block, complete: Secondary | ICD-10-CM | POA: Diagnosis not present

## 2024-08-30 LAB — CUP PACEART INCLINIC DEVICE CHECK
Battery Remaining Longevity: 39 mo
Battery Voltage: 2.94 V
Brady Statistic AP VP Percent: 2.93 %
Brady Statistic AP VS Percent: 0 %
Brady Statistic AS VP Percent: 96.81 %
Brady Statistic AS VS Percent: 0.25 %
Brady Statistic RA Percent Paced: 2.99 %
Brady Statistic RV Percent Paced: 99.75 %
Date Time Interrogation Session: 20250929115825
Implantable Lead Connection Status: 753985
Implantable Lead Connection Status: 753985
Implantable Lead Implant Date: 20080903
Implantable Lead Implant Date: 20080903
Implantable Lead Location: 753859
Implantable Lead Location: 753860
Implantable Lead Model: 5076
Implantable Lead Model: 5594
Implantable Pulse Generator Implant Date: 20190416
Lead Channel Impedance Value: 399 Ohm
Lead Channel Impedance Value: 399 Ohm
Lead Channel Impedance Value: 437 Ohm
Lead Channel Impedance Value: 437 Ohm
Lead Channel Pacing Threshold Amplitude: 0.625 V
Lead Channel Pacing Threshold Amplitude: 1 V
Lead Channel Pacing Threshold Pulse Width: 0.4 ms
Lead Channel Pacing Threshold Pulse Width: 0.4 ms
Lead Channel Sensing Intrinsic Amplitude: 24.25 mV
Lead Channel Sensing Intrinsic Amplitude: 24.25 mV
Lead Channel Sensing Intrinsic Amplitude: 3.75 mV
Lead Channel Sensing Intrinsic Amplitude: 4 mV
Lead Channel Setting Pacing Amplitude: 1.5 V
Lead Channel Setting Pacing Amplitude: 2.5 V
Lead Channel Setting Pacing Pulse Width: 0.4 ms
Lead Channel Setting Sensing Sensitivity: 4 mV
Zone Setting Status: 755011
Zone Setting Status: 755011

## 2024-08-30 NOTE — Progress Notes (Signed)
 PERIOPERATIVE PRESCRIPTION FOR IMPLANTED CARDIAC DEVICE PROGRAMMING  Patient Information: Name:  Nathaniel Richard  DOB:  08-01-1951  MRN:  [id number]  Procedure:  HERNIA SURGERY   Date of Surgery:  Clearance TBD                                  Surgeon:  CORDELLA IDLER, MD Surgeon's Group or Practice Name:  [facility] Phone number:  [phone] Fax number:  [phone]   ATTN: ROSELINE ARGYLE, CMA   Type of Clearance Requested:   - Medical  - Pharmacy:  [facility]      Type of Anesthesia:  General   Device Information:  Clinic EP Physician:  Soyla Norton, MD   Device Type:  Pacemaker Manufacturer and Phone #:  Medtronic: [id number] Pacemaker Dependent?:  Yes.   Date of Last Device Check:  08/30/2024 Normal Device Function?:  Yes.    Electrophysiologist's Recommendations:  Have magnet available. Provide continuous ECG monitoring when magnet is used or reprogramming is to be performed.  Procedure may interfere with device function.  Magnet should be placed over device during procedure.  Per Device Clinic Standing Orders, Delon DELENA Sharps, RN  12:05 PM 08/30/2024

## 2024-08-30 NOTE — Progress Notes (Signed)
  Electrophysiology Office Note:   Date:  08/30/2024  ID:  Nathaniel, Richard [birth year], MRN [id number]  Primary Cardiologist: Nathaniel Shallow, MD Primary Heart Failure: None Electrophysiologist: Nathaniel Bellard Gladis Norton, MD      History of Present Illness:   Nathaniel Richard is a 73 y.o. male with h/o complete heart block, hypertension, AVR seen today for routine electrophysiology followup.   Since last being seen in our clinic the patient reports doing well.  He has no chest pain or shortness of breath.  He is able to do his daily activities.  He has upcoming hernia surgery.  Otherwise no complaints.  he denies chest pain, palpitations, dyspnea, PND, orthopnea, nausea, vomiting, dizziness, syncope, edema, weight gain, or early satiety.   Review of systems complete and found to be negative unless listed in HPI.      EP Information / Studies Reviewed:    EKG is ordered today. Personal review as below.      PPM Interrogation-  reviewed in detail today,  See PACEART report.  Device History: Medtronic Dual Chamber PPM implanted 10/05/2007 for CHB  Risk Assessment/Calculations:             Physical Exam:   VS:  BP 118/68 (BP Location: Left Arm, Patient Position: Sitting, Cuff Size: Normal)   Pulse 76   Ht 5' 9 (1.753 m)   Wt 176 lb 3.2 oz (79.9 kg)   SpO2 97%   BMI 26.02 kg/m    Wt Readings from Last 3 Encounters:  08/30/24 176 lb 3.2 oz (79.9 kg)  01/14/24 185 lb 12.8 oz (84.3 kg)  05/05/23 187 lb (84.8 kg)     GEN: Well nourished, well developed in no acute distress NECK: No JVD; No carotid bruits CARDIAC: Regular rate and rhythm, no murmurs, rubs, gallops RESPIRATORY:  Clear to auscultation without rales, wheezing or rhonchi  ABDOMEN: Soft, non-tender, non-distended EXTREMITIES:  No edema; No deformity   ASSESSMENT AND PLAN:    CHB s/p Medtronic PPM  Normal PPM function See Pace Art report No changes today  2.  Hypertension: Well-controlled  3.  Aortic valve  replacement: Feeling well without issue.  Plan per primary cardiology.  Disposition:   Follow up with Dr. Norton 2 years   Signed, Nathaniel Huster Gladis Norton, MD

## 2024-08-31 ENCOUNTER — Encounter: Payer: Self-pay | Admitting: Cardiology

## 2024-08-31 NOTE — Telephone Encounter (Signed)
 Device clearance complete.

## 2024-09-02 NOTE — Progress Notes (Signed)
 Remote PPM Transmission

## 2024-09-09 ENCOUNTER — Telehealth: Payer: Self-pay | Admitting: Cardiology

## 2024-09-09 NOTE — Telephone Encounter (Signed)
 Pt called in to discuss a CT order or next steps to received one. Please Advise

## 2024-09-09 NOTE — Telephone Encounter (Signed)
 Spoke with pt, per DR Sini's office at [facility], according to the order they are doing CT pancreas, including the abd/pelvis and chest. Encouraged patient to confirm with their office.

## 2024-09-09 NOTE — Telephone Encounter (Signed)
 DUE FEB.2026/ 02-02-24/PRE-CERT PENDING REVIEW/01-22-24//PRE-CERT PENDING REVIEW   Pt reports that he had CT in January on Abdomen at [facility] for Pancreas; Had CT Aorta in 01/16/24 with [facility]  He is scheduled to have another scan in January and ordered for another scan for Aorta in Feb 2026.  He wants to know if there is a way to combine this to one scan- abdomen and chest, so he doesn't have to have 2 CT's and will be less exposure?   Informed him that I will send his question to Dr Pietro and we will get back in touch with recommendations.

## 2024-09-23 ENCOUNTER — Ambulatory Visit: Payer: PPO

## 2024-09-23 DIAGNOSIS — I442 Atrioventricular block, complete: Secondary | ICD-10-CM | POA: Diagnosis not present

## 2024-09-24 LAB — CUP PACEART REMOTE DEVICE CHECK
Battery Remaining Longevity: 38 mo
Battery Voltage: 2.93 V
Brady Statistic AP VP Percent: 0.95 %
Brady Statistic AP VS Percent: 0 %
Brady Statistic AS VP Percent: 99.03 %
Brady Statistic AS VS Percent: 0.02 %
Brady Statistic RA Percent Paced: 0.94 %
Brady Statistic RV Percent Paced: 99.98 %
Date Time Interrogation Session: 20251023023332
Implantable Lead Connection Status: 753985
Implantable Lead Connection Status: 753985
Implantable Lead Implant Date: 20080903
Implantable Lead Implant Date: 20080903
Implantable Lead Location: 753859
Implantable Lead Location: 753860
Implantable Lead Model: 5076
Implantable Lead Model: 5594
Implantable Pulse Generator Implant Date: 20190416
Lead Channel Impedance Value: 342 Ohm
Lead Channel Impedance Value: 361 Ohm
Lead Channel Impedance Value: 399 Ohm
Lead Channel Impedance Value: 399 Ohm
Lead Channel Pacing Threshold Amplitude: 0.625 V
Lead Channel Pacing Threshold Amplitude: 1.125 V
Lead Channel Pacing Threshold Pulse Width: 0.4 ms
Lead Channel Pacing Threshold Pulse Width: 0.4 ms
Lead Channel Sensing Intrinsic Amplitude: 3.5 mV
Lead Channel Sensing Intrinsic Amplitude: 3.5 mV
Lead Channel Sensing Intrinsic Amplitude: 6.5 mV
Lead Channel Sensing Intrinsic Amplitude: 6.5 mV
Lead Channel Setting Pacing Amplitude: 1.5 V
Lead Channel Setting Pacing Amplitude: 2.5 V
Lead Channel Setting Pacing Pulse Width: 0.4 ms
Lead Channel Setting Sensing Sensitivity: 4 mV
Zone Setting Status: 755011
Zone Setting Status: 755011

## 2024-09-27 NOTE — Progress Notes (Signed)
 Remote PPM Transmission

## 2024-09-29 ENCOUNTER — Ambulatory Visit: Payer: Self-pay | Admitting: Cardiology

## 2024-10-18 ENCOUNTER — Encounter (HOSPITAL_COMMUNITY)

## 2024-10-19 NOTE — Patient Instructions (Addendum)
 SURGICAL WAITING ROOM VISITATION  Patients having surgery or a procedure may have no more than 2 support people in the waiting area - these visitors may rotate.    Children under the age of 8 must have an adult with them who is not the patient.  Visitors with respiratory illnesses are discouraged from visiting and should remain at home.  If the patient needs to stay at the hospital during part of their recovery, the visitor guidelines for inpatient rooms apply. Pre-op nurse will coordinate an appropriate time for 1 support person to accompany patient in pre-op.  This support person may not rotate.    Please refer to the [facility] website for the visitor guidelines for Inpatients (after your surgery is over and you are in a regular room).       Your procedure is scheduled on:    11/08/2024    Report to [facility] Main Entrance    Report to admitting at   1030AM   Call this number if you have problems the morning of surgery [phone]   Do not eat food :After Midnight.   After Midnight you may have the following liquids until _ 0930_____ AM/ DAY OF SURGERY  Water Non-Citrus Juices (without pulp, NO RED-Apple, White grape, White cranberry) Black Coffee (NO MILK/CREAM OR CREAMERS, sugar ok)  Clear Tea (NO MILK/CREAM OR CREAMERS, sugar ok) regular and decaf                             Plain Jell-O (NO RED)                                           Fruit ices (not with fruit pulp, NO RED)                                     Popsicles (NO RED)                                                               Sports drinks like Gatorade (NO RED)                           If you have questions, please contact your surgeon's office.      Oral Hygiene is also important to reduce your risk of infection.                                    Remember - BRUSH YOUR TEETH THE MORNING OF SURGERY WITH YOUR REGULAR TOOTHPASTE  DENTURES WILL BE REMOVED PRIOR TO SURGERY PLEASE DO NOT  APPLY Poly grip OR ADHESIVES!!!   Do NOT smoke after Midnight   Stop all vitamins and herbal supplements 7 days before surgery.   Take these medicines the morning of surgery with A SIP OF WATER:  symmetral, amlodipine , omeprazole if needed   DO NOT TAKE ANY ORAL DIABETIC MEDICATIONS DAY OF YOUR SURGERY  Bring CPAP  mask and tubing day of surgery.                              You may not have any metal on your body including hair pins, jewelry, and body piercing             Do not wear make-up, lotions, powders, perfumes/cologne, or deodorant  Do not wear nail polish including gel and S&S, artificial/acrylic nails, or any other type of covering on natural nails including finger and toenails. If you have artificial nails, gel coating, etc. that needs to be removed by a nail salon please have this removed prior to surgery or surgery may need to be canceled/ delayed if the surgeon/ anesthesia feels like they are unable to be safely monitored.   Do not shave  48 hours prior to surgery.               Men may shave face and neck.   Do not bring valuables to the hospital. [facility] IS NOT             RESPONSIBLE   FOR VALUABLES.   Contacts, glasses, dentures or bridgework may not be worn into surgery.   Bring small overnight bag day of surgery.   DO NOT BRING YOUR HOME MEDICATIONS TO THE HOSPITAL. PHARMACY WILL DISPENSE MEDICATIONS LISTED ON YOUR MEDICATION LIST TO YOU DURING YOUR ADMISSION IN THE HOSPITAL!    Patients discharged on the day of surgery will not be allowed to drive home.  Someone NEEDS to stay with you for the first 24 hours after anesthesia.   Special Instructions: Bring a copy of your healthcare power of attorney and living will documents the day of surgery if you haven't scanned them before.              Please read over the following fact sheets you were given: IF YOU HAVE QUESTIONS ABOUT YOUR PRE-OP INSTRUCTIONS PLEASE CALL [phone].   If you received a COVID  test during your pre-op visit  it is requested that you wear a mask when out in public, stay away from anyone that may not be feeling well and notify your surgeon if you develop symptoms. If you test positive for Covid or have been in contact with anyone that has tested positive in the last 10 days please notify you surgeon.    [facility] - Preparing for Surgery Before surgery, you can play an important role.  Because skin is not sterile, your skin needs to be as free of germs as possible.  You can reduce the number of germs on your skin by washing with CHG (chlorahexidine gluconate) soap before surgery.  CHG is an antiseptic cleaner which kills germs and bonds with the skin to continue killing germs even after washing. Please DO NOT use if you have an allergy to CHG or antibacterial soaps.  If your skin becomes reddened/irritated stop using the CHG and inform your nurse when you arrive at Short Stay. Do not shave (including legs and underarms) for at least 48 hours prior to the first CHG shower.  You may shave your face/neck.  Please follow these instructions carefully:  1.  Shower with CHG Soap the night before surgery ONLY (DO NOT USE THE SOAP THE MORNING OF SURGERY).  2.  If you choose to wash your hair, wash your hair first as usual with your normal  shampoo.  3.  After  you shampoo, rinse your hair and body thoroughly to remove the shampoo.                             4.  Use CHG as you would any other liquid soap.  You can apply chg directly to the skin and wash.  Gently with a scrungie or clean washcloth.  5.  Apply the CHG Soap to your body ONLY FROM THE NECK DOWN.   Do   not use on face/ open                           Wound or open sores. Avoid contact with eyes, ears mouth and   genitals (private parts).                       Wash face,  Genitals (private parts) with your normal soap.             6.  Wash thoroughly, paying special attention to the area where your    surgery  will be  performed.  7.  Thoroughly rinse your body with warm water from the neck down.  8.  DO NOT shower/wash with your normal soap after using and rinsing off the CHG Soap.                9.  Pat yourself dry with a clean towel.            10.  Wear clean pajamas.            11.  Place clean sheets on your bed the night of your first shower and do not  sleep with pets. Day of Surgery : Do not apply any CHG, lotions/deodorants the morning of surgery.  Please wear clean clothes to the hospital/surgery center.  FAILURE TO FOLLOW THESE INSTRUCTIONS MAY RESULT IN THE CANCELLATION OF YOUR SURGERY  PATIENT SIGNATURE_________________________________  NURSE SIGNATURE__________________________________  ________________________________________________________________________

## 2024-10-20 ENCOUNTER — Other Ambulatory Visit: Payer: Self-pay

## 2024-10-20 ENCOUNTER — Encounter: Payer: Self-pay | Admitting: Cardiology

## 2024-10-20 ENCOUNTER — Encounter (HOSPITAL_COMMUNITY): Payer: Self-pay

## 2024-10-20 ENCOUNTER — Encounter (HOSPITAL_COMMUNITY)
Admission: RE | Admit: 2024-10-20 | Discharge: 2024-10-20 | Disposition: A | Discharge status: Home / Self Care | Source: Ambulatory Visit | Attending: General Surgery | Admitting: General Surgery

## 2024-10-20 VITALS — BP 140/93 | HR 75 | Temp 97.9°F | Resp 16 | Ht 69.0 in | Wt 177.0 lb

## 2024-10-20 DIAGNOSIS — Z01818 Encounter for other preprocedural examination: Secondary | ICD-10-CM | POA: Insufficient documentation

## 2024-10-20 HISTORY — DX: Neuropathy in association with hereditary ataxia: G60.2

## 2024-10-20 HISTORY — DX: Gastro-esophageal reflux disease without esophagitis: K21.9

## 2024-10-20 HISTORY — DX: Prediabetes: R73.03

## 2024-10-20 HISTORY — DX: Other complications of anesthesia, initial encounter: T88.59XA

## 2024-10-20 HISTORY — DX: Presence of prosthetic heart valve: Z95.2

## 2024-10-20 LAB — CBC
HCT: 47.5 % (ref 39.0–52.0)
Hemoglobin: 16.3 g/dL (ref 13.0–17.0)
MCH: 32.4 pg (ref 26.0–34.0)
MCHC: 34.3 g/dL (ref 30.0–36.0)
MCV: 94.4 fL (ref 80.0–100.0)
Platelets: 222 K/uL (ref 150–400)
RBC: 5.03 MIL/uL (ref 4.22–5.81)
RDW: 13.4 % (ref 11.5–15.5)
WBC: 9.2 K/uL (ref 4.0–10.5)
nRBC: 0 % (ref 0.0–0.2)

## 2024-10-20 LAB — BASIC METABOLIC PANEL WITH GFR
Anion gap: 10 (ref 5–15)
BUN: 15 mg/dL (ref 8–23)
CO2: 26 mmol/L (ref 22–32)
Calcium: 9.6 mg/dL (ref 8.9–10.3)
Chloride: 101 mmol/L (ref 98–111)
Creatinine, Ser: 1.19 mg/dL (ref 0.61–1.24)
GFR, Estimated: >60 mL/min (ref >60)
Glucose, Bld: 91 mg/dL (ref 70–99)
Potassium: 5.2 mmol/L — ABNORMAL HIGH (ref 3.5–5.1)
Sodium: 136 mmol/L (ref 135–145)

## 2024-10-20 NOTE — Progress Notes (Signed)
 PERIOPERATIVE PRESCRIPTION FOR IMPLANTED CARDIAC DEVICE PROGRAMMING  Patient Information: Name:  Nathaniel Richard  DOB:  01/01/1951  MRN:  [id number]  Planned Procedure Right  robotic inguinal hernia repair with mesh  Surgeon:  Dr  Cordella Idler  Date of Procedure:   11/08/2024  Cautery will be used.  Position during surgery:   Please send documentation back to:  Darryle Law (Fax # [phone])  Device Information:  Clinic EP Physician:  Soyla Norton, MD   Device Type:  Pacemaker Manufacturer and Phone #:  Medtronic: [phone] Pacemaker Dependent?:  Yes.   Date of Last Device Check:  09/23/24  Normal Device Function?:  Yes.    Electrophysiologist's Recommendations:  Have magnet available. Provide continuous ECG monitoring when magnet is used or reprogramming is to be performed.  Procedure may interfere with device function.  Magnet should be placed over device during procedure.  Per Device Clinic Standing Orders, Prentice JINNY Silvan, RN  5:03 PM 10/20/2024

## 2024-10-20 NOTE — Progress Notes (Signed)
 Anesthesia Review:  PCP: Vicenta Saras  Cardiologist : Bernie and Camnitz  Camnitz- LOV 08/30/24  Damien Braver, NP OV on 08/23/24  PPM/ ICD: Pacemaker Orders requested on 10/20/2024  Device Orders: Last divice check on 09/23/24  Rep Notified:  Chest x-ray : Ct angio chest- 01/18/24  EKG : 08/30/24  Echo : 2023  Stress test: Cardiac Cath :   Prediabetes- on no meds, does not check glucose at home   Activity level: can do a flight of stairs without difficutly  Sleep Study/ CPAP : none  Fasting Blood Sugar :      / Checks Blood Sugar -- times a day:    Blood Thinner/ Instructions /Last Dose: ASA / Instructions/ Last Dose :    81 mg aspirin    ** PT has Mitochondrial Disorder- cannot take propofol  **. Noted in Allergies and in Anesthesia

## 2024-10-26 ENCOUNTER — Encounter (HOSPITAL_COMMUNITY): Payer: Self-pay

## 2024-10-26 NOTE — Progress Notes (Signed)
 Case: [id number] Date/Time: 11/08/24 1226   Procedure: REPAIR, HERNIA, INGUINAL, ROBOT-ASSISTED, LAPAROSCOPIC, USING MESH (Right) - GEN/TAP BLOCK ROBOTIC RIGHT INGUINAL HERNIA REPAIR WITH MESH   Anesthesia type: General   Pre-op diagnosis: RIGHT INGUINAL HERNIA   [facility]OM 02 / [facility]   Surgeons: Polly Cordella LABOR, MD       DISCUSSION: Tabb Croghan is a 73 year old male with past medical history of HTN, bicuspid aortic valve s/p AVR, CHB s/p pacemaker placement (~2008 with generator changeout in 2018), TAA (4.2cm), cerebellar ataxia, GERD, prediabetes, arthritis  Patient is followed by neurology for POLG-associated ataxia.  He has issues with diplopia, proptosis, and ataxia.  Per patient's neurologist: There are many medications that are proposed to have mitochondrial toxicity, but there are few strict contraindications. Patients with POLG-associated disorders should not be given valproic acid due to the risk of acute fulminant hepatic failure (Reye-like syndrome) and propofol  should be generally avoided due to the increased risk of propofol  infusion reactions. Avoid valproic acid and propofol ; these have been added as allergies to the chart. Of note patient has received propofol  in the past multiple times prior to this diagnosis- last was in 2018'  Patient follows with cardiology for history of aortic valve repair in 2014 at [facility] and history of pacemaker placement for complete heart block originally in 2008.  Last echo in 2023 showed normal LVEF, grade I DD, no significant valve disease. Last seen in clinic by general cardiology on 01/14/2024.  Stable at that visit.  He had a cardiac clearance televisit on 08/23/2024 and was cleared for surgery.  Seen by EP on 08/30/2024 and also stable at that visit.  Preoperative Cardiovascular Risk Assessment:   According to the Revised Cardiac Risk Index (RCRI), his Perioperative Risk of Major Cardiac Event is (%): 0.4. His Functional Capacity in  METs is: 7.01 according to the Duke Activity Status Index (DASI).Therefore, based on ACC/AHA guidelines, patient would be at acceptable risk for the planned procedure without further cardiovascular testing.  Device orders (in 10/20/2024 progress note): Device Type:  Pacemaker Manufacturer and Phone #:  [employer]: [phone] Pacemaker Dependent?:  Yes.   Date of Last Device Check:  09/23/24          Normal Device Function?:  Yes.     Electrophysiologist's Recommendations:   Have magnet available. Provide continuous ECG monitoring when magnet is used or reprogramming is to be performed.  Procedure may interfere with device function.  Magnet should be placed over device during procedure.   VS: BP (!) 140/93   Pulse 75   Temp 36.6 C (Oral)   Resp 16   Ht 5' 9 (1.753 m)   Wt 80.3 kg   SpO2 99%   BMI 26.14 kg/m   PROVIDERS: [name], MD   LABS: Labs reviewed: Acceptable for surgery. (all labs ordered are listed, but only abnormal results are displayed)  Labs Reviewed  BASIC METABOLIC PANEL WITH GFR - Abnormal; Notable for the following components:      Result Value   Potassium 5.2 (*)    All other components within normal limits  CBC     CTA Chest 01/16/24:  IMPRESSION: Stable dilatation of the ascending aorta to 4.2 cm. Recommend annual imaging followup by CTA or MRA. This recommendation follows 2010 ACCF/AHA/AATS/ACR/ASA/SCA/SCAI/SIR/STS/SVM Guidelines for the Diagnosis and Management of Patients with Thoracic Aortic Disease. Circulation. 2010; 121: Z733-z630. Aortic aneurysm NOS (ICD10-I71.9)   Aortic valve replacement.   Cholelithiasis without complicating factors.  EKG  08/30/2024:  Atrial-sensed ventricular-paced rhythm No significant change was found  Device check 09/23/24:  Remote pacemaker interrogation. Presenting Rhythm:A-sensed V-paced. Battery and lead parameters stable with stable capture and sensing. Device programming is  appropriate. Continue remote monitoring.   Echo 02/13/22:  IMPRESSIONS    1. Left ventricular ejection fraction, by estimation, is 60 to 65%. The left ventricle has normal function. The left ventricle has no regional wall motion abnormalities. Assymetric septal hypertrophy noted.There is moderate left ventricular hypertrophy.  Left ventricular diastolic parameters are consistent with Grade I diastolic dysfunction (impaired relaxation).  2. Right ventricular systolic function is normal. The right ventricular size is normal. Pacer artifact noted in right heart.  3. Left atrial size was mildly dilated.  4. The mitral valve is normal in structure. No evidence of mitral valve regurgitation. No evidence of mitral stenosis.  5. Well seated prosthetic valve. The aortic valve was not well visualized. Aortic valve regurgitation is not visualized. No aortic stenosis is present.  6. Aneurysm of the ascending aorta, measuring 43 mm.  7. The inferior vena cava is normal in size with greater than 50% respiratory variability, suggesting right atrial pressure of 3 mmHg.   Past Medical History:  Diagnosis Date   Arthritis    Complication of anesthesia    has Mitochondrial Disorder- cannot take Propofol    GERD (gastroesophageal reflux disease)    Hypertension    Pacemaker    Pre-diabetes     Past Surgical History:  Procedure Laterality Date   AORTIC VALVE REPLACEMENT     EUS N/A 12/19/2016   Procedure: UPPER ENDOSCOPIC ULTRASOUND (EUS) RADIAL;  Surgeon: Toribio SHAUNNA Cedar, MD;  [facility];  Service: Endoscopy;  Laterality: N/A;   EUS N/A 12/19/2016   Procedure: UPPER ENDOSCOPIC ULTRASOUND (EUS) LINEAR;  Surgeon: Toribio SHAUNNA Cedar, MD;  [facility];  Service: Endoscopy;  Laterality: N/A;   EYE SURGERY     bilat cataracts   HEMORRHOID SURGERY     INSERT / REPLACE / REMOVE PACEMAKER     PPM GENERATOR CHANGEOUT N/A 02/06/2017   Procedure: PPM Generator Changeout;  Surgeon:  Will Gladis Norton, MD;  [facility];  Service: Cardiovascular;  Laterality: N/A;   PPM GENERATOR CHANGEOUT N/A 03/17/2018   Procedure: PPM GENERATOR CHANGEOUT;  Surgeon: Norton Soyla Gladis, MD;  [facility];  Service: Cardiovascular;  Laterality: N/A;   TOTAL HIP ARTHROPLASTY  07/29/2012   Procedure: TOTAL HIP ARTHROPLASTY;  Surgeon: Dempsey LULLA Moan, MD;  [facility];  Service: Orthopedics;  Laterality: Right;   tumor on pancreas surgery      2018    MEDICATIONS:  amantadine (SYMMETREL) 100 MG capsule   amLODipine  (NORVASC ) 5 MG tablet   amoxicillin (AMOXIL) 500 MG tablet   aspirin  EC 81 MG tablet   B Complex-C-Folic Acid (SUPER B COMPLEX/FA/VIT C) TABS   cetirizine (ZYRTEC) 10 MG tablet   cyanocobalamin  (,VITAMIN B-12,) 1000 MCG/ML injection   ezetimibe (ZETIA) 10 MG tablet   MAGNESIUM PO   omeprazole (PRILOSEC) 20 MG capsule   QUNOL COQ10/UBIQUINOL/MEGA PO   rosuvastatin (CRESTOR) 10 MG tablet   vitamin E 180 MG (400 UNITS) capsule   Wheat Dextrin (BENEFIBER) CHEW   No current facility-administered medications for this encounter.   Burnard CHRISTELLA Odis DEVONNA [facility] Short Stay/Anesthesiology [facility] Phone [phone] 10/26/2024 2:19 PM

## 2024-10-26 NOTE — Anesthesia Preprocedure Evaluation (Addendum)
 Anesthesia Evaluation  Patient identified by MRN, date of birth, ID band Patient awake    Reviewed: Allergy & Precautions, NPO status , Patient's Chart, lab work & pertinent test results  History of Anesthesia Complications (+) history of anesthetic complications  Airway Mallampati: II  TM Distance: >3 FB Neck ROM: Full    Dental  (+) Teeth Intact, Dental Advisory Given, Chipped,    Pulmonary neg pulmonary ROS   Pulmonary exam normal breath sounds clear to auscultation       Cardiovascular hypertension, Pt. on medications + Peripheral Vascular Disease  Normal cardiovascular exam+ dysrhythmias + pacemaker + Valvular Problems/Murmurs  Rhythm:Regular Rate:Normal  S/p AVR  Last echocardiogram was in August 2016. He had normal LV function with moderate left ventricular hypertrophy. There was a bioprosthetic aortic valve with trace aortic insufficiency. There is mild tricuspid regurgitation. CTA December 2016 showed ectasia of the descending thoracic aorta at 3.8 cm.   Neuro/Psych negative neurological ROS  negative psych ROS   GI/Hepatic Neg liver ROS,GERD  Medicated,,Pancreatic mass    Endo/Other  negative endocrine ROS    Renal/GU negative Renal ROS     Musculoskeletal  (+) Arthritis , Osteoarthritis,    Abdominal   Peds  Hematology negative hematology ROS (+)   Anesthesia Other Findings Etomidate  for induction   Reproductive/Obstetrics                              Anesthesia Physical Anesthesia Plan  ASA: 3  Anesthesia Plan: General and Regional   Post-op Pain Management: Regional block* and Ofirmev  IV (intra-op)*   Induction: Intravenous  PONV Risk Score and Plan: 2 and Ondansetron , Dexamethasone  and Treatment may vary due to age or medical condition  Airway Management Planned: Oral ETT  Additional Equipment: None  Intra-op Plan:   Post-operative Plan: Extubation in  OR  Informed Consent: I have reviewed the patients History and Physical, chart, labs and discussed the procedure including the risks, benefits and alternatives for the proposed anesthesia with the patient or authorized representative who has indicated his/her understanding and acceptance.     Dental advisory given  Plan Discussed with: CRNA and Anesthesiologist  Anesthesia Plan Comments: (See PAT note from 11/19  DISCUSSION: Nathaniel Richard is a 73 year old male with past medical history of HTN, bicuspid aortic valve s/p AVR, CHB s/p pacemaker placement (~2008 with generator changeout in 2018), TAA (4.2cm), cerebellar ataxia, GERD, prediabetes, arthritis   Patient is followed by neurology for POLG-associated ataxia.  He has issues with diplopia, proptosis, and ataxia.  Per patient's neurologist: There are many medications that are proposed to have mitochondrial toxicity, but there are few strict contraindications. Patients with POLG-associated disorders should not be given valproic acid due to the risk of acute fulminant hepatic failure (Reye-like syndrome) and propofol  should be generally avoided due to the increased risk of propofol  infusion reactions. Avoid valproic acid and propofol ; these have been added as allergies to the chart. Of note patient has received propofol  in the past multiple times prior to this diagnosis- last was in 2018'   Patient follows with cardiology for history of aortic valve repair in 2014 at [facility] and history of pacemaker placement for complete heart block originally in 2008.  Last echo in 2023 showed normal LVEF, grade I DD, no significant valve disease. Last seen in clinic by general cardiology on 01/14/2024.  Stable at that visit.  He had a cardiac clearance televisit on 08/23/2024 and  was cleared for surgery.  Seen by EP on 08/30/2024 and also stable at that visit.  Preoperative Cardiovascular Risk Assessment: According to the Revised Cardiac Risk Index (RCRI), his  Perioperative Risk of Major Cardiac Event is (%): 0.4. His Functional Capacity in METs is: 7.01 according to the Duke Activity Status Index (DASI).Therefore, based on ACC/AHA guidelines, patient would be at acceptable risk for the planned procedure without further cardiovascular testing.   EKG 08/30/2024:   Atrial-sensed ventricular-paced rhythm No significant change was found   Device check 09/23/24:   Remote pacemaker interrogation. Presenting Rhythm:A-sensed V-paced. Battery and lead parameters stable with stable capture and sensing. Device programming is appropriate. Continue remote monitoring.    Echo 02/13/22:   IMPRESSIONS      1. Left ventricular ejection fraction, by estimation, is 60 to 65%. The left ventricle has normal function. The left ventricle has no regional wall motion abnormalities. Assymetric septal hypertrophy noted.There is moderate left ventricular hypertrophy.  Left ventricular diastolic parameters are consistent with Grade I diastolic dysfunction (impaired relaxation).  2. Right ventricular systolic function is normal. The right ventricular size is normal. Pacer artifact noted in right heart.  3. Left atrial size was mildly dilated.  4. The mitral valve is normal in structure. No evidence of mitral valve regurgitation. No evidence of mitral stenosis.  5. Well seated prosthetic valve. The aortic valve was not well visualized. Aortic valve regurgitation is not visualized. No aortic stenosis is present.  6. Aneurysm of the ascending aorta, measuring 43 mm.  7. The inferior vena cava is normal in size with greater than 50% respiratory variability, suggesting right atrial pressure of 3 mmHg.     Etomidate  for induction)         Anesthesia Quick Evaluation

## 2024-11-01 ENCOUNTER — Ambulatory Visit: Admitting: Cardiology

## 2024-11-08 ENCOUNTER — Encounter (HOSPITAL_COMMUNITY)
Admission: RE | Disposition: A | Discharge status: Home / Self Care | Payer: Self-pay | Source: Ambulatory Visit | Attending: General Surgery

## 2024-11-08 ENCOUNTER — Encounter (HOSPITAL_COMMUNITY): Payer: Self-pay | Admitting: General Surgery

## 2024-11-08 ENCOUNTER — Ambulatory Visit (HOSPITAL_COMMUNITY): Payer: Self-pay | Admitting: Physician Assistant

## 2024-11-08 ENCOUNTER — Other Ambulatory Visit (HOSPITAL_COMMUNITY): Payer: Self-pay

## 2024-11-08 ENCOUNTER — Ambulatory Visit (HOSPITAL_COMMUNITY): Payer: Self-pay

## 2024-11-08 ENCOUNTER — Ambulatory Visit (HOSPITAL_COMMUNITY)
Admission: RE | Admit: 2024-11-08 | Discharge: 2024-11-08 | Disposition: A | Discharge status: Home / Self Care | Source: Ambulatory Visit | Attending: General Surgery | Admitting: General Surgery

## 2024-11-08 DIAGNOSIS — Z01818 Encounter for other preprocedural examination: Secondary | ICD-10-CM

## 2024-11-08 HISTORY — PX: XI ROBOTIC ASSISTED INGUINAL HERNIA REPAIR WITH MESH: SHX6706

## 2024-11-08 SURGERY — REPAIR, HERNIA, INGUINAL, ROBOT-ASSISTED, LAPAROSCOPIC, USING MESH
Anesthesia: Regional | Laterality: Right

## 2024-11-08 MED ORDER — MIDAZOLAM HCL (PF) 2 MG/2ML IJ SOLN
1.0000 mg | Freq: Once | INTRAMUSCULAR | Status: AC
  Administered 2024-11-08: 2 mg via INTRAVENOUS
  Filled 2024-11-08: qty 2

## 2024-11-08 MED ORDER — PHENYLEPHRINE HCL-NACL 20-0.9 MG/250ML-% IV SOLN
INTRAVENOUS | Status: DC | PRN
  Administered 2024-11-08: 50 ug/min via INTRAVENOUS

## 2024-11-08 MED ORDER — SUGAMMADEX SODIUM 200 MG/2ML IV SOLN
INTRAVENOUS | Status: AC
  Filled 2024-11-08: qty 4

## 2024-11-08 MED ORDER — SUGAMMADEX SODIUM 200 MG/2ML IV SOLN
INTRAVENOUS | Status: DC | PRN
  Administered 2024-11-08: 400 mg via INTRAVENOUS

## 2024-11-08 MED ORDER — OXYCODONE HCL 5 MG/5ML PO SOLN: 5.0000 mg | Freq: Once | ORAL | Status: DC | PRN

## 2024-11-08 MED ORDER — LIDOCAINE HCL (PF) 2 % IJ SOLN
INTRAMUSCULAR | Status: AC
  Filled 2024-11-08: qty 5

## 2024-11-08 MED ORDER — CHLORHEXIDINE GLUCONATE CLOTH 2 % EX PADS: 6.0000 | MEDICATED_PAD | Freq: Once | CUTANEOUS | Status: DC

## 2024-11-08 MED ORDER — FENTANYL CITRATE (PF) 50 MCG/ML IJ SOSY: 25.0000 ug | PREFILLED_SYRINGE | INTRAMUSCULAR | Status: DC | PRN

## 2024-11-08 MED ORDER — OXYCODONE HCL 5 MG PO TABS: 5.0000 mg | ORAL_TABLET | Freq: Once | ORAL | Status: DC | PRN

## 2024-11-08 MED ORDER — ACETAMINOPHEN 325 MG PO TABS
650.0000 mg | ORAL_TABLET | Freq: Four times a day (QID) | ORAL | 0 refills | Status: AC
  Filled 2024-11-08: qty 50, 7d supply, fill #0

## 2024-11-08 MED ORDER — MEPERIDINE HCL 25 MG/ML IJ SOLN: 6.2500 mg | INTRAMUSCULAR | Status: DC | PRN

## 2024-11-08 MED ORDER — FENTANYL CITRATE (PF) 100 MCG/2ML IJ SOLN
INTRAMUSCULAR | Status: AC
  Filled 2024-11-08: qty 2

## 2024-11-08 MED ORDER — BUPIVACAINE-EPINEPHRINE 0.25% -1:200000 IJ SOLN
INTRAMUSCULAR | Status: DC | PRN
  Administered 2024-11-08: 30 mL

## 2024-11-08 MED ORDER — DEXMEDETOMIDINE HCL IN NACL 80 MCG/20ML IV SOLN
INTRAVENOUS | Status: DC | PRN
  Administered 2024-11-08 (×2): 8 ug via INTRAVENOUS

## 2024-11-08 MED ORDER — ACETAMINOPHEN 500 MG PO TABS: 1000.0000 mg | ORAL_TABLET | Freq: Once | ORAL | Status: DC

## 2024-11-08 MED ORDER — ACETAMINOPHEN 500 MG PO TABS
1000.0000 mg | ORAL_TABLET | ORAL | Status: AC
  Administered 2024-11-08: 1000 mg via ORAL
  Filled 2024-11-08: qty 2

## 2024-11-08 MED ORDER — ETOMIDATE 2 MG/ML IV SOLN
INTRAVENOUS | Status: AC
  Filled 2024-11-08: qty 10

## 2024-11-08 MED ORDER — CEFAZOLIN SODIUM-DEXTROSE 2-4 GM/100ML-% IV SOLN
2.0000 g | INTRAVENOUS | Status: AC
  Administered 2024-11-08: 2 g via INTRAVENOUS
  Filled 2024-11-08: qty 100

## 2024-11-08 MED ORDER — PHENYLEPHRINE 80 MCG/ML (10ML) SYRINGE FOR IV PUSH (FOR BLOOD PRESSURE SUPPORT)
PREFILLED_SYRINGE | INTRAVENOUS | Status: AC
  Filled 2024-11-08: qty 10

## 2024-11-08 MED ORDER — SODIUM CHLORIDE 0.9 % IV SOLN: INTRAVENOUS | Status: DC | PRN

## 2024-11-08 MED ORDER — LACTATED RINGERS IV SOLN: INTRAVENOUS | Status: DC

## 2024-11-08 MED ORDER — ROCURONIUM BROMIDE 10 MG/ML (PF) SYRINGE
PREFILLED_SYRINGE | INTRAVENOUS | Status: AC
  Filled 2024-11-08: qty 10

## 2024-11-08 MED ORDER — ORAL CARE MOUTH RINSE: 15.0000 mL | Freq: Once | OROMUCOSAL | Status: AC

## 2024-11-08 MED ORDER — OXYCODONE HCL 5 MG PO TABS
5.0000 mg | ORAL_TABLET | Freq: Three times a day (TID) | ORAL | 0 refills | Status: AC | PRN
  Filled 2024-11-08: qty 12, 4d supply, fill #0

## 2024-11-08 MED ORDER — STERILE WATER FOR IRRIGATION IR SOLN
Status: DC | PRN
  Administered 2024-11-08: 1000 mL

## 2024-11-08 MED ORDER — CHLORHEXIDINE GLUCONATE 0.12 % MT SOLN
15.0000 mL | Freq: Once | OROMUCOSAL | Status: AC
  Administered 2024-11-08: 15 mL via OROMUCOSAL

## 2024-11-08 MED ORDER — PHENYLEPHRINE 80 MCG/ML (10ML) SYRINGE FOR IV PUSH (FOR BLOOD PRESSURE SUPPORT)
PREFILLED_SYRINGE | INTRAVENOUS | Status: DC | PRN
  Administered 2024-11-08: 80 ug via INTRAVENOUS
  Administered 2024-11-08: 160 ug via INTRAVENOUS

## 2024-11-08 MED ORDER — ETOMIDATE 2 MG/ML IV SOLN
INTRAVENOUS | Status: DC | PRN
  Administered 2024-11-08: 12 mg via INTRAVENOUS

## 2024-11-08 MED ORDER — DEXAMETHASONE SOD PHOSPHATE PF 10 MG/ML IJ SOLN
INTRAMUSCULAR | Status: DC | PRN
  Administered 2024-11-08: 5 mg via INTRAVENOUS

## 2024-11-08 MED ORDER — ONDANSETRON HCL 4 MG/2ML IJ SOLN: 4.0000 mg | Freq: Once | INTRAMUSCULAR | Status: DC | PRN

## 2024-11-08 MED ORDER — IBUPROFEN 200 MG PO TABS
600.0000 mg | ORAL_TABLET | Freq: Four times a day (QID) | ORAL | 0 refills | Status: AC
  Filled 2024-11-08: qty 75, 7d supply, fill #0

## 2024-11-08 MED ORDER — ROCURONIUM BROMIDE 100 MG/10ML IV SOLN
INTRAVENOUS | Status: DC | PRN
  Administered 2024-11-08: 100 mg via INTRAVENOUS

## 2024-11-08 MED ORDER — FENTANYL CITRATE (PF) 100 MCG/2ML IJ SOLN
INTRAMUSCULAR | Status: DC | PRN
  Administered 2024-11-08: 50 ug via INTRAVENOUS
  Administered 2024-11-08 (×2): 25 ug via INTRAVENOUS

## 2024-11-08 MED ORDER — 0.9 % SODIUM CHLORIDE (POUR BTL) OPTIME
TOPICAL | Status: DC | PRN
  Administered 2024-11-08: 1000 mL

## 2024-11-08 MED ORDER — BUPIVACAINE-EPINEPHRINE (PF) 0.25% -1:200000 IJ SOLN
INTRAMUSCULAR | Status: DC | PRN
  Administered 2024-11-08: 30 mL

## 2024-11-08 MED ORDER — FENTANYL CITRATE (PF) 50 MCG/ML IJ SOSY
50.0000 ug | PREFILLED_SYRINGE | Freq: Once | INTRAMUSCULAR | Status: AC
  Administered 2024-11-08: 50 ug via INTRAVENOUS
  Filled 2024-11-08: qty 2

## 2024-11-08 MED ORDER — ONDANSETRON HCL 4 MG/2ML IJ SOLN
INTRAMUSCULAR | Status: AC
  Filled 2024-11-08: qty 2

## 2024-11-08 MED ORDER — LIDOCAINE HCL (CARDIAC) PF 100 MG/5ML IV SOSY
PREFILLED_SYRINGE | INTRAVENOUS | Status: DC | PRN
  Administered 2024-11-08: 100 mg via INTRAVENOUS

## 2024-11-08 MED ORDER — ONDANSETRON HCL 4 MG/2ML IJ SOLN
INTRAMUSCULAR | Status: DC | PRN
  Administered 2024-11-08: 4 mg via INTRAVENOUS

## 2024-11-08 MED ORDER — GABAPENTIN 300 MG PO CAPS
300.0000 mg | ORAL_CAPSULE | ORAL | Status: AC
  Administered 2024-11-08: 300 mg via ORAL
  Filled 2024-11-08: qty 1

## 2024-11-08 SURGICAL SUPPLY — 43 items
APPLICATOR COTTON TIP 6 STRL (MISCELLANEOUS) IMPLANT
BAG COUNTER SPONGE SURGICOUNT (BAG) IMPLANT
BLADE SURG SZ11 CARB STEEL (BLADE) ×1 IMPLANT
CHLORAPREP W/TINT 26 (MISCELLANEOUS) ×1 IMPLANT
COVER MAYO STAND STRL (DRAPES) ×1 IMPLANT
COVER SURGICAL LIGHT HANDLE (MISCELLANEOUS) ×1 IMPLANT
COVER TIP SHEARS 8 DVNC (MISCELLANEOUS) ×1 IMPLANT
DERMABOND ADVANCED .7 DNX12 (GAUZE/BANDAGES/DRESSINGS) ×1 IMPLANT
DRAPE ARM DVNC X/XI (DISPOSABLE) ×3 IMPLANT
DRAPE COLUMN DVNC XI (DISPOSABLE) ×1 IMPLANT
DRIVER NDL LRG 8 DVNC XI (INSTRUMENTS) IMPLANT
DRIVER NDL MEGA SUTCUT DVNCXI (INSTRUMENTS) IMPLANT
ELECT REM PT RETURN 15FT ADLT (MISCELLANEOUS) ×1 IMPLANT
FORCEPS BPLR 8 MD DVNC XI (FORCEP) ×1 IMPLANT
GAUZE 4X4 16PLY ~~LOC~~+RFID DBL (SPONGE) ×1 IMPLANT
GLOVE BIO SURGEON STRL SZ7 (GLOVE) ×2 IMPLANT
GOWN STRL REUS W/ TWL XL LVL3 (GOWN DISPOSABLE) ×2 IMPLANT
IRRIGATION SUCT STRKRFLW 2 WTP (MISCELLANEOUS) IMPLANT
KIT BASIN OR (CUSTOM PROCEDURE TRAY) ×1 IMPLANT
KIT TURNOVER KIT A (KITS) ×1 IMPLANT
MARKER SKIN DUAL TIP RULER LAB (MISCELLANEOUS) ×1 IMPLANT
MESH 3DMAX MID 5X7 RT XLRG (Mesh General) IMPLANT
NDL HYPO 22X1.5 SAFETY MO (MISCELLANEOUS) ×1 IMPLANT
NDL INSUFFLATION 14GA 120MM (NEEDLE) ×1 IMPLANT
OBTURATOR OPTICALSTD 8 DVNC (TROCAR) ×1 IMPLANT
PACK CARDIOVASCULAR III (CUSTOM PROCEDURE TRAY) ×1 IMPLANT
SCISSORS MNPLR CVD DVNC XI (INSTRUMENTS) ×1 IMPLANT
SEAL UNIV 5-12 XI (MISCELLANEOUS) ×3 IMPLANT
SOLUTION ANTFG W/FOAM PAD STRL (MISCELLANEOUS) ×1 IMPLANT
SOLUTION ELECTROSURG ANTI STCK (MISCELLANEOUS) ×1 IMPLANT
SPIKE FLUID TRANSFER (MISCELLANEOUS) ×1 IMPLANT
SUT MNCRL AB 4-0 PS2 18 (SUTURE) ×1 IMPLANT
SUT VIC AB 2-0 SH 27X BRD (SUTURE) IMPLANT
SUT VIC AB 3-0 SH 27X BRD (SUTURE) ×1 IMPLANT
SUT VIC AB 3-0 SH 27XBRD (SUTURE) IMPLANT
SUT VLOC 3-0 9IN GRN (SUTURE) ×1 IMPLANT
SYR 10ML LL (SYRINGE) ×1 IMPLANT
SYR 20ML LL LF (SYRINGE) ×1 IMPLANT
TAPE STRIPS DRAPE STRL (GAUZE/BANDAGES/DRESSINGS) ×1 IMPLANT
TOWEL GREEN STERILE FF (TOWEL DISPOSABLE) ×1 IMPLANT
TOWEL OR 17X26 10 PK STRL BLUE (TOWEL DISPOSABLE) ×1 IMPLANT
TROCAR Z-THREAD OPTICAL 5X100M (TROCAR) IMPLANT
TUBING INSUFFLATION 10FT LAP (TUBING) ×1 IMPLANT

## 2024-11-08 NOTE — H&P (Signed)
 Nathaniel Richard 1951-07-25  [id number].    HPI:  73 y/o M with a right inguinal hernia who presents for elective repair. He reports that he is in his usual state of health and denies any recent changes in medication.   ROS: Review of Systems  Constitutional: Negative.   HENT: Negative.    Eyes: Negative.   Respiratory: Negative.    Cardiovascular: Negative.   Gastrointestinal: Negative.   Genitourinary: Negative.   Musculoskeletal: Negative.   Skin: Negative.   Neurological: Negative.   Endo/Heme/Allergies: Negative.   Psychiatric/Behavioral: Negative.      Family History  Problem Relation Age of Onset   Diabetes Mother    Hypertension Mother    Dementia Mother    Heart failure Father    Hypertension Father     Past Medical History:  Diagnosis Date   Arthritis    Complication of anesthesia    has Mitochondrial Disorder- cannot take Propofol    GERD (gastroesophageal reflux disease)    Hypertension    Pacemaker    POLG-related ataxia neuropathy spectrum    Pre-diabetes    S/P AVR (aortic valve replacement)     Past Surgical History:  Procedure Laterality Date   AORTIC VALVE REPLACEMENT     EUS N/A 12/19/2016   Procedure: UPPER ENDOSCOPIC ULTRASOUND (EUS) RADIAL;  Surgeon: Toribio SHAUNNA Cedar, MD;  [facility];  Service: Endoscopy;  Laterality: N/A;   EUS N/A 12/19/2016   Procedure: UPPER ENDOSCOPIC ULTRASOUND (EUS) LINEAR;  Surgeon: Toribio SHAUNNA Cedar, MD;  [facility];  Service: Endoscopy;  Laterality: N/A;   EYE SURGERY     bilat cataracts   HEMORRHOID SURGERY     INSERT / REPLACE / REMOVE PACEMAKER     PPM GENERATOR CHANGEOUT N/A 02/06/2017   Procedure: PPM Generator Changeout;  Surgeon: Will Gladis Norton, MD;  [facility];  Service: Cardiovascular;  Laterality: N/A;   PPM GENERATOR CHANGEOUT N/A 03/17/2018   Procedure: PPM GENERATOR CHANGEOUT;  Surgeon: Norton Soyla Gladis, MD;  [facility];  Service:  Cardiovascular;  Laterality: N/A;   TOTAL HIP ARTHROPLASTY  07/29/2012   Procedure: TOTAL HIP ARTHROPLASTY;  Surgeon: Dempsey LULLA Moan, MD;  Location[facility];  Service: Orthopedics;  Laterality: Right;   tumor on pancreas surgery      2018    Social History:  reports that he has never smoked. He has never used smokeless tobacco. He reports that he does not drink alcohol and does not use drugs.  Allergies:  Allergies  Allergen Reactions   Propofol  Other (See Comments)    Other Reaction(s): Fatique  Risk of metabolic acidosis and propofol -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  Other Reaction(s): Fatigue  Risk of metabolic acidosis and propofol -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  Mental status changes,  Psychotic episodes, Mental status changes, Psychotic episodes., Mental status changes,    Medications Prior to Admission  Medication Sig Dispense Refill   amantadine (SYMMETREL) 100 MG capsule Take 100 mg by mouth in the morning.  amLODipine  (NORVASC ) 5 MG tablet Take 5 mg by mouth in the morning.     aspirin  EC 81 MG tablet Take 81 mg by mouth in the morning.     B Complex-C-Folic Acid (SUPER B COMPLEX/FA/VIT C) TABS Take 1 capsule by mouth in the morning.     cetirizine (ZYRTEC) 10 MG tablet Take 10 mg by mouth daily as needed for allergies or rhinitis.      cyanocobalamin  (,VITAMIN B-12,) 1000 MCG/ML injection Inject 1,000 mcg into the muscle every 30  (thirty) days.     ezetimibe (ZETIA) 10 MG tablet Take 10 mg by mouth in the morning.     MAGNESIUM PO Take 1 capsule by mouth at bedtime.     omeprazole (PRILOSEC) 20 MG capsule Take 20 mg by mouth daily as needed (indigestion/heartburn.).     QUNOL COQ10/UBIQUINOL/MEGA PO Take 1 capsule by mouth in the morning.     rosuvastatin (CRESTOR) 10 MG tablet Take 10 mg by mouth every Wednesday.     vitamin E 180 MG (400 UNITS) capsule Take 400 Units by mouth in the morning.     Wheat Dextrin (BENEFIBER) CHEW Chew 2 each by mouth in the morning.     amoxicillin (AMOXIL) 500 MG tablet Take 2,000 mg by mouth as directed. Before dental appointments      Physical Exam: Blood pressure 125/72, pulse 78, temperature 97.9 F (36.6 C), temperature source Oral, resp. rate 18, weight 80.3 kg, SpO2 95%. Gen: male, NAD Abd: soft, non-distended, right groin marked  No results found for this or any previous visit (from the past 48 hours). No results found.  Assessment/Plan 73 y/o M with a right inguinal hernia  - Will proceed to the OR. We discussed the alternatives and potential risks of surgery, including but not limited to: bleeding, infection, damage to bowel or surrounding structures, damage to the vas/cord, mesh complications, chronic pain, recurrent hernia, and need for additional procedures. All questions were addressed and consent was obtained.    [name] Surgery 11/08/2024, 12:40 PM Please see Amion for pager number during day hours 7:00am-4:30pm or 7:00am -11:30am on weekends

## 2024-11-08 NOTE — Discharge Instructions (Signed)

## 2024-11-08 NOTE — Anesthesia Procedure Notes (Signed)
 Procedure Name: Intubation Date/Time: 11/08/2024 1:40 PM  Performed by: Belvie Valri NOVAK, CRNAPre-anesthesia Checklist: Patient identified, Emergency Drugs available, Suction available and Patient being monitored Patient Re-evaluated:Patient Re-evaluated prior to induction Oxygen Delivery Method: Circle System Utilized Preoxygenation: Pre-oxygenation with 100% oxygen Induction Type: IV induction Ventilation: Mask ventilation without difficulty and Oral airway inserted - appropriate to patient size Laryngoscope Size: Glidescope and 4 Grade View: Grade I Tube type: Oral Tube size: 7.5 mm Number of attempts: 1 (1 DL attempt, 1 Glidescope attempt) Airway Equipment and Method: Stylet and Oral airway Placement Confirmation: ETT inserted through vocal cords under direct vision, positive ETCO2 and breath sounds checked- equal and bilateral Secured at: 23 cm Tube secured with: Tape Dental Injury: Teeth and Oropharynx as per pre-operative assessment  Difficulty Due To: Difficult Airway- due to reduced neck mobility and Difficult Airway- due to anterior larynx

## 2024-11-08 NOTE — Anesthesia Procedure Notes (Signed)
 Anesthesia Regional Block: TAP block   Pre-Anesthetic Checklist: , timeout performed,  Correct Patient, Correct Site, Correct Laterality,  Correct Procedure, Correct Position, site marked,  Risks and benefits discussed,  Surgical consent,  Pre-op evaluation,  At surgeon's request and post-op pain management  Laterality: Right  Prep: chloraprep       Needles:  Injection technique: Single-shot  Needle Type: Echogenic Stimulator Needle     Needle Length: 5cm  Needle Gauge: 22     Additional Needles:   Procedures:, nerve stimulator,,, ultrasound used (permanent image in chart),,    Narrative:  Start time: 11/08/2024 12:05 PM End time: 11/08/2024 12:09 PM Injection made incrementally with aspirations every 5 mL.  Performed by: Personally  Anesthesiologist: Mallory Manus, MD  Additional Notes: Functioning IV was confirmed and monitors were applied.  A 50mm 22ga Arrow echogenic stimulator needle was used. Sterile prep and drape,hand hygiene and sterile gloves were used. Ultrasound guidance: relevant anatomy identified, needle position confirmed, local anesthetic spread visualized around nerve(s)., vascular puncture avoided.  Image printed for medical record. Negative aspiration and negative test dose prior to incremental administration of local anesthetic. The patient tolerated the procedure well.

## 2024-11-08 NOTE — Anesthesia Postprocedure Evaluation (Signed)
 Anesthesia Post Note  Patient: Nathaniel Richard  Procedure(s) Performed: REPAIR, HERNIA, INGUINAL, ROBOT-ASSISTED, LAPAROSCOPIC, USING MESH (Right)     Patient location during evaluation: PACU Anesthesia Type: General and Regional Level of consciousness: awake and alert Pain management: pain level controlled Vital Signs Assessment: post-procedure vital signs reviewed and stable Respiratory status: spontaneous breathing, nonlabored ventilation, respiratory function stable and patient connected to nasal cannula oxygen Cardiovascular status: blood pressure returned to baseline and stable Postop Assessment: no apparent nausea or vomiting Anesthetic complications: no   No notable events documented.  Last Vitals:  Vitals:   11/08/24 1600 11/08/24 1615  BP: 96/66 103/70  Pulse: 69 70  Resp: 20 19  Temp:    SpO2: 97% 94%    Last Pain:  Vitals:   11/08/24 1615  TempSrc:   PainSc: 0-No pain                 Saida Lonon

## 2024-11-08 NOTE — Op Note (Signed)
 Nathaniel Richard ([id number])  Operative Report   Date 11/08/2024  PREOPERATIVE DIAGNOSIS: Right Inguinal hernia  POSTOPERATIVE DIAGNOSIS: Same  PROCEDURE:  Robotic Right Inguinal Hernia Repair with Mesh (Bard 3D Midweight XL Right Polypropylene Mesh)   SURGEON: Cordella Idler, MD  ANESTHESIA: General Anesthesia    INTRAOPERATIVE FINDINGS: Moderate fat-containing right indirect inguinal hernia  IMPLANTS: Implant Name Type Inv. Item Serial No. Manufacturer Lot No. LRB No. Used Action  MESH 3DMAX MID 5X7 RT MARSHA - [id number] Mesh General MESH 3DMAX MID 5X7 RT [employer] BARD ACCESS [id number] Right 1 Implanted    ESTIMATED BLOOD LOSS: Minimal  COMPLICATIONS: None  SPECIMENS: None  OPERATIVE INDICATIONS: Pt is a 73 y.o. male who presents with a right inguinal hernia.  The patient desires definitive repair.  The procedure's risks, benefits, and alternatives were explained to the patient.  Risks, including the risks of bleeding, infection, need for mesh removal, and potential for hernia recurrence, were discussed.  The patient agreed to proceed and signed informed consent in front of a witness.   DESCRIPTION OF PROCEDURE: After preoperatively identifying the patient in holding, the patient was brought to the operating room and placed supine on the operating room table.  Both arms were tucked and padded to avoid potential nerve injury.  Sequential Compression Devices were placed bilaterally.  After induction of general anesthesia, the patient was given the appropriate perioperative antibiotics.  The abdomen was prepped and draped in a typical sterile fashion.  A JACHO approved time out, where the name of the patient, the operation, and the intended site were confirmed. The abdomen was accessed with a Veress needle via the standard drop technique in the LUQ at Palmer's point.  Insufflation was established.  An 8 mm optical port was placed under direct visualization in the RLQ.  A camera was  introduced into the abdomen, and a thorough inspection of the abdomen was performed to confirm there was no additional pathology.  He did have multiple adhesions in the left upper quadrant, likely from his pancreas surgery and subsequent leak. I was able to bluntly take down the adhesions to find the veress needle and confirm no evidence of injury from entry. Two additional 8 mm robotic ports were placed laterally under direct visualization, one superior to the umbilicus and another in the RLQ. The patient was then placed into 15-degree Trendelenburg position to facilitate examination of the bilateral inguinal spaces.  A thorough inspection of the abdomen was undertaken.  A moderate right-sided indirect inguinal hernia was identified and amenable to robotic repair.       The [employer] robot was brought into the field and docked.  An 8 mm 30-degree scope was placed in the mid-abdominal port.  The peritoneum was taken down 6 cm superior to the hernia from the anterior abdominal wall, and dissection was taken inferiorly towards the hernia.  Medial to the epigastric vessels, the parietal compartment was dissected and completed to visualize the rectus muscle.  This dissection was carried down to the symphysis pubis and obturator foramen.  The retropubic space was dissected to expose at least 2 cm contralateral to the midline.  Cooper's ligament was exposed and cleared at least 2 cm below the ligament to ensure adequate space for the inferior border of the mesh.  Hesselbach's triangle was cleared, assessing for a direct hernia. There was no direct or femoral hernias.   Lateral to the epigastric vessels, the dissection was carried out into the visceral compartment, continuing  in the true preperitoneal plane.  The indirect hernia sac was carefully reduced and separated from the cord structures with medial retraction and a combination of blunt/sharp dissection and focused cautery.  This dissection continued until the cord  structures were parietalized completely, allowing for continuous visualization of the reflected peritoneum with the line originating 2 cm below Cooper's medially and across the psoas muscle in the lateral compartment.  The internal ring was interrogated for a cord lipoma. There was no large cord lipoma.   Having achieved a complete dissection with a critical view of the entire myopectineal orifice, a piece of Bard 3D Midweight XL Right Polypropylene Mesh was introduced into the field.  It was centered at the iliopubic tract, with the medial side crossing the midline and the inferior edge positioned 2 cm below Cooper's ligament.  With complete coverage of the myopectineal orifice, the inferior edge of the peritoneum was posterior and inferior to the mesh.  The lateral aspect of the mesh extended 3-5 cm beyond the lateral edge of the psoas.  Cephalad retraction of the peritoneal flap did not cause lifting of the inferior mesh edge or cord structures.  The mesh was fixated using an interrupted 3-0 Vicryl suture placed to the ipsilateral Coopers ligament.  The peritoneal flap was closed with a running 3-0 Stratafix barbed suture.  Additional holes in the peritoneum closed.  Hemostasis was assured in the entirety of the abdomen.  The two lateral ports were removed under direct visualization.  The abdomen was desufflated under direct visualization.  The port sites were closed, local anesthetic was infiltrated, and Dermabond was applied.  After confirming twice that the sponge, needle, and instrument counts were correct, the procedure was terminated, the patient was extubated and transferred to the recovery room in stable condition.     DISPOSITION: Stable to PACU.   Electronically Signed By: Cordella DELENA Idler 11/08/2024 3:12 PM

## 2024-11-08 NOTE — Anesthesia Postprocedure Evaluation (Signed)
 Anesthesia Post Note  Patient: Nathaniel Richard  Procedure(s) Performed: REPAIR, HERNIA, INGUINAL, ROBOT-ASSISTED, LAPAROSCOPIC, USING MESH (Right)     Patient location during evaluation: PACU Anesthesia Type: Regional and General Level of consciousness: awake and alert Pain management: pain level controlled Vital Signs Assessment: post-procedure vital signs reviewed and stable Respiratory status: spontaneous breathing, nonlabored ventilation, respiratory function stable and patient connected to nasal cannula oxygen Cardiovascular status: blood pressure returned to baseline and stable Postop Assessment: no apparent nausea or vomiting Anesthetic complications: no   No notable events documented.  Last Vitals:  Vitals:   11/08/24 1600 11/08/24 1615  BP: 96/66 103/70  Pulse: 69 70  Resp: 20 19  Temp:    SpO2: 97% 94%    Last Pain:  Vitals:   11/08/24 1615  TempSrc:   PainSc: 0-No pain                 Nayelli Inglis

## 2024-11-08 NOTE — Transfer of Care (Signed)
 Immediate Anesthesia Transfer of Care Note  Patient: Nathaniel Richard  Procedure(s) Performed: REPAIR, HERNIA, INGUINAL, ROBOT-ASSISTED, LAPAROSCOPIC, USING MESH (Right)  Patient Location: PACU  Anesthesia Type:General  Level of Consciousness: awake  Airway & Oxygen Therapy: Patient Spontanous Breathing and Patient connected to face mask oxygen  Post-op Assessment: Report given to RN and Post -op Vital signs reviewed and stable  Post vital signs: Reviewed and stable  Last Vitals:  Vitals Value Taken Time  BP 97/63 11/08/24 15:24  Temp    Pulse 65 11/08/24 15:28  Resp 21 11/08/24 15:28  SpO2 96 % 11/08/24 15:28  Vitals shown include unfiled device data.  Last Pain:  Vitals:   11/08/24 1114  TempSrc:   PainSc: 0-No pain         Complications: No notable events documented.

## 2024-11-09 ENCOUNTER — Encounter (HOSPITAL_COMMUNITY): Payer: Self-pay | Admitting: General Surgery

## 2024-12-14 ENCOUNTER — Encounter: Payer: Self-pay | Admitting: Cardiology

## 2024-12-14 NOTE — Telephone Encounter (Signed)
 Discussed with front desk staff who will update pt chart

## 2024-12-23 ENCOUNTER — Ambulatory Visit: Payer: PPO | Attending: Cardiology

## 2024-12-23 DIAGNOSIS — I442 Atrioventricular block, complete: Secondary | ICD-10-CM | POA: Diagnosis not present

## 2024-12-24 ENCOUNTER — Ambulatory Visit: Payer: Self-pay | Admitting: Cardiology

## 2024-12-24 LAB — CUP PACEART REMOTE DEVICE CHECK
Battery Remaining Longevity: 35 mo
Battery Voltage: 2.93 V
Brady Statistic AP VP Percent: 0.26 %
Brady Statistic AP VS Percent: 0 %
Brady Statistic AS VP Percent: 99.68 %
Brady Statistic AS VS Percent: 0.06 %
Brady Statistic RA Percent Paced: 0.27 %
Brady Statistic RV Percent Paced: 99.94 %
Date Time Interrogation Session: 20260122191433
Implantable Lead Connection Status: 753985
Implantable Lead Connection Status: 753985
Implantable Lead Implant Date: 20080903
Implantable Lead Implant Date: 20080903
Implantable Lead Location: 753859
Implantable Lead Location: 753860
Implantable Lead Model: 5076
Implantable Lead Model: 5594
Implantable Pulse Generator Implant Date: 20190416
Lead Channel Impedance Value: 342 Ohm
Lead Channel Impedance Value: 361 Ohm
Lead Channel Impedance Value: 399 Ohm
Lead Channel Impedance Value: 399 Ohm
Lead Channel Pacing Threshold Amplitude: 0.625 V
Lead Channel Pacing Threshold Amplitude: 1.125 V
Lead Channel Pacing Threshold Pulse Width: 0.4 ms
Lead Channel Pacing Threshold Pulse Width: 0.4 ms
Lead Channel Sensing Intrinsic Amplitude: 3.125 mV
Lead Channel Sensing Intrinsic Amplitude: 3.125 mV
Lead Channel Sensing Intrinsic Amplitude: 7.25 mV
Lead Channel Sensing Intrinsic Amplitude: 7.25 mV
Lead Channel Setting Pacing Amplitude: 1.5 V
Lead Channel Setting Pacing Amplitude: 2.5 V
Lead Channel Setting Pacing Pulse Width: 0.4 ms
Lead Channel Setting Sensing Sensitivity: 4 mV
Zone Setting Status: 755011
Zone Setting Status: 755011

## 2024-12-27 NOTE — Progress Notes (Signed)
 Remote PPM Transmission

## 2025-01-12 ENCOUNTER — Ambulatory Visit: Admitting: Cardiology

## 2025-03-24 ENCOUNTER — Ambulatory Visit

## 2025-06-23 ENCOUNTER — Ambulatory Visit

## 2025-09-22 ENCOUNTER — Ambulatory Visit

## 2025-12-22 ENCOUNTER — Ambulatory Visit

## 2026-03-23 ENCOUNTER — Ambulatory Visit

## 2026-06-22 ENCOUNTER — Ambulatory Visit

## 2026-09-21 ENCOUNTER — Ambulatory Visit

## 2026-12-21 ENCOUNTER — Ambulatory Visit
